# Patient Record
Sex: Female | Born: 1940 | Hispanic: No | Smoking: Never smoker
Health system: Southern US, Community
[De-identification: ages and names within clinical notes are randomized; demographics above are authoritative.]

## PROBLEM LIST (undated history)

## (undated) DIAGNOSIS — F0280 Dementia in other diseases classified elsewhere without behavioral disturbance: Secondary | ICD-10-CM

## (undated) DIAGNOSIS — R600 Localized edema: Secondary | ICD-10-CM

## (undated) DIAGNOSIS — M1990 Unspecified osteoarthritis, unspecified site: Secondary | ICD-10-CM

## (undated) DIAGNOSIS — H269 Unspecified cataract: Secondary | ICD-10-CM

## (undated) DIAGNOSIS — R609 Edema, unspecified: Secondary | ICD-10-CM

## (undated) DIAGNOSIS — E785 Hyperlipidemia, unspecified: Secondary | ICD-10-CM

## (undated) DIAGNOSIS — F32A Depression, unspecified: Secondary | ICD-10-CM

## (undated) DIAGNOSIS — E119 Type 2 diabetes mellitus without complications: Secondary | ICD-10-CM

## (undated) DIAGNOSIS — C44321 Squamous cell carcinoma of skin of nose: Secondary | ICD-10-CM

## (undated) DIAGNOSIS — M2550 Pain in unspecified joint: Secondary | ICD-10-CM

## (undated) DIAGNOSIS — F319 Bipolar disorder, unspecified: Secondary | ICD-10-CM

## (undated) DIAGNOSIS — I2510 Atherosclerotic heart disease of native coronary artery without angina pectoris: Secondary | ICD-10-CM

## (undated) DIAGNOSIS — F329 Major depressive disorder, single episode, unspecified: Secondary | ICD-10-CM

## (undated) DIAGNOSIS — Z8709 Personal history of other diseases of the respiratory system: Secondary | ICD-10-CM

## (undated) DIAGNOSIS — N189 Chronic kidney disease, unspecified: Secondary | ICD-10-CM

## (undated) DIAGNOSIS — I10 Essential (primary) hypertension: Secondary | ICD-10-CM

## (undated) HISTORY — PX: FRACTURE SURGERY: SHX138

## (undated) HISTORY — PX: OTHER SURGICAL HISTORY: SHX169

---

## 2003-04-16 HISTORY — PX: DILATION AND CURETTAGE OF UTERUS: SHX78

## 2005-04-15 HISTORY — PX: VAGINAL HYSTERECTOMY: SUR661

## 2007-05-12 HISTORY — PX: CORONARY ARTERY BYPASS GRAFT: SHX141

## 2008-02-20 ENCOUNTER — Inpatient Hospital Stay: Payer: Self-pay | Admitting: Internal Medicine

## 2008-02-21 ENCOUNTER — Ambulatory Visit: Payer: Self-pay | Admitting: Cardiology

## 2008-04-06 ENCOUNTER — Ambulatory Visit: Payer: Self-pay | Admitting: Internal Medicine

## 2008-04-07 ENCOUNTER — Emergency Department: Payer: Self-pay | Admitting: Emergency Medicine

## 2008-06-16 ENCOUNTER — Ambulatory Visit: Payer: Self-pay | Admitting: Internal Medicine

## 2010-04-15 DIAGNOSIS — C44321 Squamous cell carcinoma of skin of nose: Secondary | ICD-10-CM

## 2010-04-15 HISTORY — DX: Squamous cell carcinoma of skin of nose: C44.321

## 2010-04-15 HISTORY — PX: SQUAMOUS CELL CARCINOMA EXCISION: SHX2433

## 2012-09-10 ENCOUNTER — Ambulatory Visit: Payer: Self-pay | Admitting: Internal Medicine

## 2014-04-30 ENCOUNTER — Emergency Department (HOSPITAL_COMMUNITY): Payer: No Typology Code available for payment source | Admitting: Anesthesiology

## 2014-04-30 ENCOUNTER — Encounter (HOSPITAL_COMMUNITY): Admission: EM | Disposition: A | Discharge status: Skilled Nursing Facility | Payer: Self-pay | Source: Home / Self Care

## 2014-04-30 ENCOUNTER — Emergency Department (HOSPITAL_COMMUNITY): Payer: No Typology Code available for payment source

## 2014-04-30 ENCOUNTER — Inpatient Hospital Stay (HOSPITAL_COMMUNITY)
Admission: EM | Admit: 2014-04-30 | Discharge: 2014-05-05 | DRG: 493 | Disposition: A | Discharge status: Skilled Nursing Facility | Payer: No Typology Code available for payment source | Attending: General Surgery | Admitting: General Surgery

## 2014-04-30 ENCOUNTER — Encounter (HOSPITAL_COMMUNITY): Payer: Self-pay

## 2014-04-30 DIAGNOSIS — R001 Bradycardia, unspecified: Secondary | ICD-10-CM | POA: Diagnosis present

## 2014-04-30 DIAGNOSIS — Z951 Presence of aortocoronary bypass graft: Secondary | ICD-10-CM

## 2014-04-30 DIAGNOSIS — Z22322 Carrier or suspected carrier of Methicillin resistant Staphylococcus aureus: Secondary | ICD-10-CM

## 2014-04-30 DIAGNOSIS — N183 Chronic kidney disease, stage 3 unspecified: Secondary | ICD-10-CM | POA: Diagnosis present

## 2014-04-30 DIAGNOSIS — M179 Osteoarthritis of knee, unspecified: Secondary | ICD-10-CM | POA: Diagnosis present

## 2014-04-30 DIAGNOSIS — I129 Hypertensive chronic kidney disease with stage 1 through stage 4 chronic kidney disease, or unspecified chronic kidney disease: Secondary | ICD-10-CM | POA: Diagnosis present

## 2014-04-30 DIAGNOSIS — R079 Chest pain, unspecified: Secondary | ICD-10-CM | POA: Diagnosis present

## 2014-04-30 DIAGNOSIS — Z6841 Body Mass Index (BMI) 40.0 and over, adult: Secondary | ICD-10-CM | POA: Diagnosis not present

## 2014-04-30 DIAGNOSIS — E119 Type 2 diabetes mellitus without complications: Secondary | ICD-10-CM | POA: Diagnosis present

## 2014-04-30 DIAGNOSIS — I252 Old myocardial infarction: Secondary | ICD-10-CM | POA: Diagnosis not present

## 2014-04-30 DIAGNOSIS — E785 Hyperlipidemia, unspecified: Secondary | ICD-10-CM | POA: Diagnosis present

## 2014-04-30 DIAGNOSIS — K432 Incisional hernia without obstruction or gangrene: Secondary | ICD-10-CM | POA: Diagnosis present

## 2014-04-30 DIAGNOSIS — S82841C Displaced bimalleolar fracture of right lower leg, initial encounter for open fracture type IIIA, IIIB, or IIIC: Principal | ICD-10-CM | POA: Diagnosis present

## 2014-04-30 DIAGNOSIS — Z8249 Family history of ischemic heart disease and other diseases of the circulatory system: Secondary | ICD-10-CM | POA: Diagnosis not present

## 2014-04-30 DIAGNOSIS — R55 Syncope and collapse: Secondary | ICD-10-CM | POA: Diagnosis present

## 2014-04-30 DIAGNOSIS — N179 Acute kidney failure, unspecified: Secondary | ICD-10-CM | POA: Diagnosis not present

## 2014-04-30 DIAGNOSIS — I2510 Atherosclerotic heart disease of native coronary artery without angina pectoris: Secondary | ICD-10-CM | POA: Diagnosis present

## 2014-04-30 DIAGNOSIS — Z7982 Long term (current) use of aspirin: Secondary | ICD-10-CM

## 2014-04-30 DIAGNOSIS — R339 Retention of urine, unspecified: Secondary | ICD-10-CM | POA: Diagnosis not present

## 2014-04-30 DIAGNOSIS — S99911A Unspecified injury of right ankle, initial encounter: Secondary | ICD-10-CM | POA: Diagnosis present

## 2014-04-30 DIAGNOSIS — M25461 Effusion, right knee: Secondary | ICD-10-CM

## 2014-04-30 DIAGNOSIS — D62 Acute posthemorrhagic anemia: Secondary | ICD-10-CM | POA: Diagnosis not present

## 2014-04-30 DIAGNOSIS — S2241XA Multiple fractures of ribs, right side, initial encounter for closed fracture: Secondary | ICD-10-CM | POA: Diagnosis present

## 2014-04-30 DIAGNOSIS — S82891B Other fracture of right lower leg, initial encounter for open fracture type I or II: Secondary | ICD-10-CM | POA: Diagnosis present

## 2014-04-30 HISTORY — DX: Hyperlipidemia, unspecified: E78.5

## 2014-04-30 HISTORY — PX: I & D EXTREMITY: SHX5045

## 2014-04-30 HISTORY — DX: Chronic kidney disease, unspecified: N18.9

## 2014-04-30 HISTORY — DX: Bipolar disorder, unspecified: F31.9

## 2014-04-30 HISTORY — PX: ORIF ANKLE FRACTURE: SHX5408

## 2014-04-30 HISTORY — DX: Essential (primary) hypertension: I10

## 2014-04-30 HISTORY — DX: Depression, unspecified: F32.A

## 2014-04-30 HISTORY — DX: Atherosclerotic heart disease of native coronary artery without angina pectoris: I25.10

## 2014-04-30 HISTORY — DX: Major depressive disorder, single episode, unspecified: F32.9

## 2014-04-30 HISTORY — DX: Squamous cell carcinoma of skin of nose: C44.321

## 2014-04-30 HISTORY — DX: Unspecified osteoarthritis, unspecified site: M19.90

## 2014-04-30 HISTORY — DX: Type 2 diabetes mellitus without complications: E11.9

## 2014-04-30 LAB — COMPREHENSIVE METABOLIC PANEL
ALT: 18 U/L (ref 0–35)
ANION GAP: 9 (ref 5–15)
AST: 30 U/L (ref 0–37)
Albumin: 3.4 g/dL — ABNORMAL LOW (ref 3.5–5.2)
Alkaline Phosphatase: 72 U/L (ref 39–117)
BUN: 21 mg/dL (ref 6–23)
CALCIUM: 8.5 mg/dL (ref 8.4–10.5)
CO2: 22 mmol/L (ref 19–32)
Chloride: 106 mEq/L (ref 96–112)
Creatinine, Ser: 1.4 mg/dL — ABNORMAL HIGH (ref 0.50–1.10)
GFR calc Af Amer: 42 mL/min — ABNORMAL LOW (ref >90)
GFR, EST NON AFRICAN AMERICAN: 36 mL/min — AB (ref >90)
GLUCOSE: 144 mg/dL — AB (ref 70–99)
POTASSIUM: 4.4 mmol/L (ref 3.5–5.1)
SODIUM: 137 mmol/L (ref 135–145)
Total Bilirubin: 0.9 mg/dL (ref 0.3–1.2)
Total Protein: 5.7 g/dL — ABNORMAL LOW (ref 6.0–8.3)

## 2014-04-30 LAB — CBC
HEMATOCRIT: 34.4 % — AB (ref 36.0–46.0)
HEMOGLOBIN: 11.4 g/dL — AB (ref 12.0–15.0)
MCH: 28.4 pg (ref 26.0–34.0)
MCHC: 33.1 g/dL (ref 30.0–36.0)
MCV: 85.6 fL (ref 78.0–100.0)
PLATELETS: 183 10*3/uL (ref 150–400)
RBC: 4.02 MIL/uL (ref 3.87–5.11)
RDW: 13.9 % (ref 11.5–15.5)
WBC: 10.8 10*3/uL — AB (ref 4.0–10.5)

## 2014-04-30 LAB — PROTIME-INR
INR: 1.02 (ref 0.00–1.49)
Prothrombin Time: 13.5 seconds (ref 11.6–15.2)

## 2014-04-30 LAB — I-STAT TROPONIN, ED: Troponin i, poc: 0.01 ng/mL (ref 0.00–0.08)

## 2014-04-30 LAB — I-STAT CHEM 8, ED
BUN: 28 mg/dL — ABNORMAL HIGH (ref 6–23)
CALCIUM ION: 1.18 mmol/L (ref 1.13–1.30)
CHLORIDE: 106 meq/L (ref 96–112)
Creatinine, Ser: 1.6 mg/dL — ABNORMAL HIGH (ref 0.50–1.10)
Glucose, Bld: 146 mg/dL — ABNORMAL HIGH (ref 70–99)
HEMATOCRIT: 37 % (ref 36.0–46.0)
Hemoglobin: 12.6 g/dL (ref 12.0–15.0)
Potassium: 4.7 mmol/L (ref 3.5–5.1)
Sodium: 138 mmol/L (ref 135–145)
TCO2: 21 mmol/L (ref 0–100)

## 2014-04-30 LAB — ETHANOL: Alcohol, Ethyl (B): <5 mg/dL (ref 0–9)

## 2014-04-30 LAB — SAMPLE TO BLOOD BANK

## 2014-04-30 SURGERY — IRRIGATION AND DEBRIDEMENT EXTREMITY
Anesthesia: General | Laterality: Right

## 2014-04-30 MED ORDER — SODIUM CHLORIDE 0.9 % IR SOLN
Status: DC | PRN
  Administered 2014-04-30: 3000 mL

## 2014-04-30 MED ORDER — ROCURONIUM BROMIDE 100 MG/10ML IV SOLN
INTRAVENOUS | Status: DC | PRN
  Administered 2014-04-30: 50 mg via INTRAVENOUS

## 2014-04-30 MED ORDER — CEFAZOLIN SODIUM-DEXTROSE 2-3 GM-% IV SOLR
INTRAVENOUS | Status: AC
  Filled 2014-04-30: qty 50

## 2014-04-30 MED ORDER — ONDANSETRON HCL 4 MG/2ML IJ SOLN
INTRAMUSCULAR | Status: AC
  Filled 2014-04-30: qty 2

## 2014-04-30 MED ORDER — PROPOFOL 10 MG/ML IV BOLUS: 0.5000 mg/kg | Freq: Once | INTRAVENOUS | Status: DC

## 2014-04-30 MED ORDER — FENTANYL CITRATE 0.05 MG/ML IJ SOLN
50.0000 ug | Freq: Once | INTRAMUSCULAR | Status: AC
  Administered 2014-04-30: 50 ug via INTRAVENOUS

## 2014-04-30 MED ORDER — ROCURONIUM BROMIDE 50 MG/5ML IV SOLN
INTRAVENOUS | Status: AC
  Filled 2014-04-30: qty 1

## 2014-04-30 MED ORDER — SUCCINYLCHOLINE CHLORIDE 20 MG/ML IJ SOLN
INTRAMUSCULAR | Status: DC | PRN
  Administered 2014-04-30: 100 mg via INTRAVENOUS

## 2014-04-30 MED ORDER — SODIUM CHLORIDE 0.9 % IV SOLN
INTRAVENOUS | Status: AC | PRN
  Administered 2014-04-30: 1000 mL via INTRAVENOUS

## 2014-04-30 MED ORDER — FENTANYL CITRATE 0.05 MG/ML IJ SOLN
INTRAMUSCULAR | Status: AC
  Filled 2014-04-30: qty 5

## 2014-04-30 MED ORDER — SUCCINYLCHOLINE CHLORIDE 20 MG/ML IJ SOLN
INTRAMUSCULAR | Status: AC
  Filled 2014-04-30: qty 1

## 2014-04-30 MED ORDER — GLYCOPYRROLATE 0.2 MG/ML IJ SOLN
INTRAMUSCULAR | Status: AC
  Filled 2014-04-30: qty 3

## 2014-04-30 MED ORDER — PROPOFOL 10 MG/ML IV BOLUS
INTRAVENOUS | Status: AC | PRN
  Administered 2014-04-30 (×2): 45 ug via INTRAVENOUS

## 2014-04-30 MED ORDER — MIDAZOLAM HCL 2 MG/2ML IJ SOLN
INTRAMUSCULAR | Status: AC
  Filled 2014-04-30: qty 2

## 2014-04-30 MED ORDER — IOHEXOL 300 MG/ML  SOLN
80.0000 mL | Freq: Once | INTRAMUSCULAR | Status: AC | PRN
  Administered 2014-04-30: 80 mL via INTRAVENOUS

## 2014-04-30 MED ORDER — NEOSTIGMINE METHYLSULFATE 10 MG/10ML IV SOLN
INTRAVENOUS | Status: AC
  Filled 2014-04-30: qty 1

## 2014-04-30 MED ORDER — PROPOFOL 10 MG/ML IV BOLUS
INTRAVENOUS | Status: DC | PRN
  Administered 2014-04-30: 120 mg via INTRAVENOUS

## 2014-04-30 MED ORDER — PROPOFOL 10 MG/ML IV BOLUS
INTRAVENOUS | Status: AC
  Filled 2014-04-30: qty 20

## 2014-04-30 MED ORDER — FENTANYL CITRATE 0.05 MG/ML IJ SOLN
INTRAMUSCULAR | Status: DC | PRN
  Administered 2014-04-30: 200 ug via INTRAVENOUS
  Administered 2014-04-30 – 2014-05-01 (×4): 50 ug via INTRAVENOUS

## 2014-04-30 MED ORDER — LACTATED RINGERS IV SOLN
INTRAVENOUS | Status: DC | PRN
  Administered 2014-04-30 (×2): via INTRAVENOUS

## 2014-04-30 MED ORDER — CEFAZOLIN SODIUM 1-5 GM-% IV SOLN
1.0000 g | Freq: Three times a day (TID) | INTRAVENOUS | Status: DC
  Administered 2014-04-30: 1 g via INTRAVENOUS
  Administered 2014-04-30: 2 g via INTRAVENOUS
  Administered 2014-05-01 – 2014-05-02 (×4): 1 g via INTRAVENOUS
  Filled 2014-04-30 (×6): qty 50

## 2014-04-30 MED ORDER — FENTANYL CITRATE 0.05 MG/ML IJ SOLN
INTRAMUSCULAR | Status: AC | PRN
  Administered 2014-04-30: 50 ug via INTRAVENOUS

## 2014-04-30 MED ORDER — LIDOCAINE HCL (CARDIAC) 20 MG/ML IV SOLN
INTRAVENOUS | Status: AC
  Filled 2014-04-30: qty 5

## 2014-04-30 MED ORDER — ONDANSETRON HCL 4 MG/2ML IJ SOLN
INTRAMUSCULAR | Status: DC | PRN
  Administered 2014-04-30: 4 mg via INTRAVENOUS

## 2014-04-30 MED ORDER — FENTANYL CITRATE 0.05 MG/ML IJ SOLN
INTRAMUSCULAR | Status: AC
  Administered 2014-04-30: 50 ug via INTRAVENOUS
  Filled 2014-04-30: qty 2

## 2014-04-30 MED ORDER — MIDAZOLAM HCL 5 MG/5ML IJ SOLN
INTRAMUSCULAR | Status: DC | PRN
  Administered 2014-04-30: 1 mg via INTRAVENOUS

## 2014-04-30 MED ORDER — LIDOCAINE HCL (CARDIAC) 20 MG/ML IV SOLN
INTRAVENOUS | Status: DC | PRN
  Administered 2014-04-30: 40 mg via INTRAVENOUS

## 2014-04-30 SURGICAL SUPPLY — 84 items
2.7 DRILL BIT ×3 IMPLANT
BANDAGE ELASTIC 4 VELCRO ST LF (GAUZE/BANDAGES/DRESSINGS) ×3 IMPLANT
BANDAGE ESMARK 6X9 LF (GAUZE/BANDAGES/DRESSINGS) ×1 IMPLANT
BIT DRILL 2.5X2.75 QC CALB (BIT) ×3 IMPLANT
BIT DRILL 2.9 CANN QC NONSTRL (BIT) ×3 IMPLANT
BIT DRILL CALIBRATED 2.7 (BIT) ×2 IMPLANT
BIT DRILL CALIBRATED 2.7MM (BIT) ×1
BLADE SURG 10 STRL SS (BLADE) ×3 IMPLANT
BLADE SURG 15 STRL LF DISP TIS (BLADE) ×1 IMPLANT
BLADE SURG 15 STRL SS (BLADE) ×2
BNDG COHESIVE 4X5 TAN STRL (GAUZE/BANDAGES/DRESSINGS) ×3 IMPLANT
BNDG COHESIVE 6X5 TAN STRL LF (GAUZE/BANDAGES/DRESSINGS) ×3 IMPLANT
BNDG CONFORM 3 STRL LF (GAUZE/BANDAGES/DRESSINGS) ×3 IMPLANT
BNDG ESMARK 6X9 LF (GAUZE/BANDAGES/DRESSINGS) ×3
CANISTER SUCT 3000ML (MISCELLANEOUS) ×3 IMPLANT
CHLORAPREP W/TINT 26ML (MISCELLANEOUS) ×3 IMPLANT
COVER SURGICAL LIGHT HANDLE (MISCELLANEOUS) ×3 IMPLANT
CUFF TOURNIQUET SINGLE 34IN LL (TOURNIQUET CUFF) ×3 IMPLANT
CUFF TOURNIQUET SINGLE 44IN (TOURNIQUET CUFF) IMPLANT
DRAIN CHANNEL 10M FLAT 3/4 FLT (DRAIN) IMPLANT
DRAIN PENROSE 1/2X12 LTX STRL (WOUND CARE) IMPLANT
DRAPE OEC MINIVIEW 54X84 (DRAPES) ×3 IMPLANT
DRAPE U-SHAPE 47X51 STRL (DRAPES) ×3 IMPLANT
DRSG ADAPTIC 3X8 NADH LF (GAUZE/BANDAGES/DRESSINGS) ×3 IMPLANT
DRSG MEPITEL 4X7.2 (GAUZE/BANDAGES/DRESSINGS) ×3 IMPLANT
DRSG PAD ABDOMINAL 8X10 ST (GAUZE/BANDAGES/DRESSINGS) ×6 IMPLANT
ELECT REM PT RETURN 9FT ADLT (ELECTROSURGICAL) ×3
ELECTRODE REM PT RTRN 9FT ADLT (ELECTROSURGICAL) ×1 IMPLANT
EVACUATOR SILICONE 100CC (DRAIN) IMPLANT
GAUZE SPONGE 4X4 12PLY STRL (GAUZE/BANDAGES/DRESSINGS) IMPLANT
GLOVE BIO SURGEON STRL SZ7 (GLOVE) ×6 IMPLANT
GLOVE BIO SURGEON STRL SZ8 (GLOVE) ×6 IMPLANT
GLOVE BIOGEL PI IND STRL 7.0 (GLOVE) ×1 IMPLANT
GLOVE BIOGEL PI IND STRL 7.5 (GLOVE) ×1 IMPLANT
GLOVE BIOGEL PI IND STRL 8 (GLOVE) ×3 IMPLANT
GLOVE BIOGEL PI INDICATOR 7.0 (GLOVE) ×2
GLOVE BIOGEL PI INDICATOR 7.5 (GLOVE) ×2
GLOVE BIOGEL PI INDICATOR 8 (GLOVE) ×6
GOWN STRL REUS W/ TWL LRG LVL3 (GOWN DISPOSABLE) ×2 IMPLANT
GOWN STRL REUS W/ TWL XL LVL3 (GOWN DISPOSABLE) ×1 IMPLANT
GOWN STRL REUS W/TWL LRG LVL3 (GOWN DISPOSABLE) ×4
GOWN STRL REUS W/TWL XL LVL3 (GOWN DISPOSABLE) ×2
K-WIRE ACE 1.6X6 (WIRE) ×9
KIT BASIN OR (CUSTOM PROCEDURE TRAY) ×3 IMPLANT
KIT ROOM TURNOVER OR (KITS) ×3 IMPLANT
KWIRE ACE 1.6X6 (WIRE) ×3 IMPLANT
NEEDLE 22X1 1/2 (OR ONLY) (NEEDLE) IMPLANT
NS IRRIG 1000ML POUR BTL (IV SOLUTION) ×3 IMPLANT
PACK ORTHO EXTREMITY (CUSTOM PROCEDURE TRAY) ×3 IMPLANT
PAD ARMBOARD 7.5X6 YLW CONV (MISCELLANEOUS) ×6 IMPLANT
PAD CAST 4YDX4 CTTN HI CHSV (CAST SUPPLIES) ×1 IMPLANT
PADDING CAST ABS 4INX4YD NS (CAST SUPPLIES) ×2
PADDING CAST ABS COTTON 4X4 ST (CAST SUPPLIES) ×1 IMPLANT
PADDING CAST COTTON 4X4 STRL (CAST SUPPLIES) ×2
PADDING CAST COTTON 6X4 STRL (CAST SUPPLIES) ×3 IMPLANT
PLATE FIBULAR COMP LOCK 10H (Plate) ×3 IMPLANT
SCREW ACE CAN 4.0 40M (Screw) ×6 IMPLANT
SCREW LOCK 3.5X10 DIST TIB (Screw) ×6 IMPLANT
SCREW LOCK 3.5X12 DIST TIB (Screw) ×9 IMPLANT
SCREW NON LOCKING LP 3.5 16MM (Screw) ×6 IMPLANT
SET CYSTO W/LG BORE CLAMP LF (SET/KITS/TRAYS/PACK) ×3 IMPLANT
SOAP 2 % CHG 4 OZ (WOUND CARE) ×3 IMPLANT
SPLINT PLASTER CAST XFAST 5X30 (CAST SUPPLIES) ×1 IMPLANT
SPLINT PLASTER XFAST SET 5X30 (CAST SUPPLIES) ×2
SPONGE LAP 18X18 X RAY DECT (DISPOSABLE) ×3 IMPLANT
SPONGE LAP 4X18 X RAY DECT (DISPOSABLE) ×3 IMPLANT
STAPLER VISISTAT 35W (STAPLE) IMPLANT
SUCTION FRAZIER TIP 10 FR DISP (SUCTIONS) ×3 IMPLANT
SUT ETHILON 3 0 FSL (SUTURE) ×3 IMPLANT
SUT MNCRL AB 3-0 PS2 18 (SUTURE) IMPLANT
SUT PDS AB 2-0 CT1 27 (SUTURE) ×3 IMPLANT
SUT PROLENE 3 0 PS 2 (SUTURE) ×3 IMPLANT
SUT VIC AB 2-0 CT1 27 (SUTURE) ×4
SUT VIC AB 2-0 CT1 TAPERPNT 27 (SUTURE) ×2 IMPLANT
SUT VIC AB 3-0 PS2 18 (SUTURE) ×2
SUT VIC AB 3-0 PS2 18XBRD (SUTURE) ×1 IMPLANT
SYR CONTROL 10ML LL (SYRINGE) IMPLANT
TOWEL OR 17X24 6PK STRL BLUE (TOWEL DISPOSABLE) ×3 IMPLANT
TOWEL OR 17X26 10 PK STRL BLUE (TOWEL DISPOSABLE) ×3 IMPLANT
TUBE CONNECTING 12'X1/4 (SUCTIONS) ×1
TUBE CONNECTING 12X1/4 (SUCTIONS) ×2 IMPLANT
TUBING CYSTO DISP (UROLOGICAL SUPPLIES) ×3 IMPLANT
WATER STERILE IRR 1000ML POUR (IV SOLUTION) ×3 IMPLANT
YANKAUER SUCT BULB TIP NO VENT (SUCTIONS) ×3 IMPLANT

## 2014-04-30 NOTE — Anesthesia Preprocedure Evaluation (Addendum)
Anesthesia Evaluation  Patient identified by MRN, date of birth, ID band Patient awake    Reviewed: Allergy & Precautions, NPO status , Patient's Chart, lab work & pertinent test results, reviewed documented beta blocker date and time   History of Anesthesia Complications Negative for: history of anesthetic complications  Airway Mallampati: II  TM Distance: >3 FB Neck ROM: Full    Dental  (+) Teeth Intact, Dental Advisory Given, Chipped,    Pulmonary neg pulmonary ROS,          Cardiovascular hypertension, Pt. on medications and Pt. on home beta blockers + CAD and + CABG     Neuro/Psych negative neurological ROS  negative psych ROS   GI/Hepatic negative GI ROS, Neg liver ROS,   Endo/Other  diabetes, Type 2, Oral Hypoglycemic Agents  Renal/GU Renal InsufficiencyRenal disease     Musculoskeletal  (+) Arthritis -, Osteoarthritis,    Abdominal   Peds  Hematology   Anesthesia Other Findings   Reproductive/Obstetrics negative OB ROS                        Anesthesia Physical Anesthesia Plan  ASA: III and emergent  Anesthesia Plan: General   Post-op Pain Management:    Induction: Intravenous  Airway Management Planned: Oral ETT  Additional Equipment:   Intra-op Plan:   Post-operative Plan: Possible Post-op intubation/ventilation  Informed Consent: I have reviewed the patients History and Physical, chart, labs and discussed the procedure including the risks, benefits and alternatives for the proposed anesthesia with the patient or authorized representative who has indicated his/her understanding and acceptance.   Dental advisory given  Plan Discussed with: CRNA and Anesthesiologist  Anesthesia Plan Comments:       Anesthesia Quick Evaluation
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
## 2014-04-30 NOTE — Anesthesia Procedure Notes (Signed)
Procedure Name: Intubation Date/Time: 04/30/2014 10:44 PM Performed by: Anibal Quinby S Pre-anesthesia Checklist: Patient identified, Timeout performed, Emergency Drugs available, Suction available and Patient being monitored Patient Re-evaluated:Patient Re-evaluated prior to inductionOxygen Delivery Method: Circle system utilized Preoxygenation: Pre-oxygenation with 100% oxygen Intubation Type: IV induction, Rapid sequence and Cricoid Pressure applied Ventilation: Mask ventilation without difficulty Laryngoscope Size: Mac and 3 Grade View: Grade I Tube type: Oral Tube size: 7.5 mm Number of attempts: 1 Airway Equipment and Method: Stylet Placement Confirmation: ETT inserted through vocal cords under direct vision,  positive ETCO2 and breath sounds checked- equal and bilateral Secured at: 22 cm Tube secured with: Tape Dental Injury: Teeth and Oropharynx as per pre-operative assessment
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
## 2014-05-01 DIAGNOSIS — E118 Type 2 diabetes mellitus with unspecified complications: Secondary | ICD-10-CM

## 2014-05-01 DIAGNOSIS — S82892A Other fracture of left lower leg, initial encounter for closed fracture: Secondary | ICD-10-CM

## 2014-05-01 DIAGNOSIS — E785 Hyperlipidemia, unspecified: Secondary | ICD-10-CM | POA: Insufficient documentation

## 2014-05-01 DIAGNOSIS — S2241XA Multiple fractures of ribs, right side, initial encounter for closed fracture: Secondary | ICD-10-CM | POA: Diagnosis present

## 2014-05-01 DIAGNOSIS — E119 Type 2 diabetes mellitus without complications: Secondary | ICD-10-CM | POA: Insufficient documentation

## 2014-05-01 DIAGNOSIS — S2241XD Multiple fractures of ribs, right side, subsequent encounter for fracture with routine healing: Secondary | ICD-10-CM

## 2014-05-01 DIAGNOSIS — R7989 Other specified abnormal findings of blood chemistry: Secondary | ICD-10-CM

## 2014-05-01 DIAGNOSIS — R079 Chest pain, unspecified: Secondary | ICD-10-CM | POA: Diagnosis present

## 2014-05-01 DIAGNOSIS — I10 Essential (primary) hypertension: Secondary | ICD-10-CM | POA: Insufficient documentation

## 2014-05-01 DIAGNOSIS — R55 Syncope and collapse: Secondary | ICD-10-CM | POA: Diagnosis present

## 2014-05-01 DIAGNOSIS — S82891B Other fracture of right lower leg, initial encounter for open fracture type I or II: Secondary | ICD-10-CM | POA: Diagnosis present

## 2014-05-01 LAB — COMPREHENSIVE METABOLIC PANEL
ALT: 17 U/L (ref 0–35)
AST: 34 U/L (ref 0–37)
Albumin: 3.4 g/dL — ABNORMAL LOW (ref 3.5–5.2)
Alkaline Phosphatase: 72 U/L (ref 39–117)
Anion gap: 9 (ref 5–15)
BILIRUBIN TOTAL: 0.9 mg/dL (ref 0.3–1.2)
BUN: 22 mg/dL (ref 6–23)
CALCIUM: 9 mg/dL (ref 8.4–10.5)
CO2: 24 mmol/L (ref 19–32)
CREATININE: 1.67 mg/dL — AB (ref 0.50–1.10)
Chloride: 105 mEq/L (ref 96–112)
GFR calc Af Amer: 34 mL/min — ABNORMAL LOW (ref >90)
GFR, EST NON AFRICAN AMERICAN: 29 mL/min — AB (ref >90)
Glucose, Bld: 131 mg/dL — ABNORMAL HIGH (ref 70–99)
Potassium: 4 mmol/L (ref 3.5–5.1)
SODIUM: 138 mmol/L (ref 135–145)
Total Protein: 6.2 g/dL (ref 6.0–8.3)

## 2014-05-01 LAB — GLUCOSE, CAPILLARY
GLUCOSE-CAPILLARY: 137 mg/dL — AB (ref 70–99)
GLUCOSE-CAPILLARY: 153 mg/dL — AB (ref 70–99)
GLUCOSE-CAPILLARY: 161 mg/dL — AB (ref 70–99)
Glucose-Capillary: 165 mg/dL — ABNORMAL HIGH (ref 70–99)
Glucose-Capillary: 166 mg/dL — ABNORMAL HIGH (ref 70–99)
Glucose-Capillary: 181 mg/dL — ABNORMAL HIGH (ref 70–99)
Glucose-Capillary: 99 mg/dL (ref 70–99)

## 2014-05-01 LAB — CBC
HCT: 33 % — ABNORMAL LOW (ref 36.0–46.0)
HEMATOCRIT: 35 % — AB (ref 36.0–46.0)
HEMOGLOBIN: 11.3 g/dL — AB (ref 12.0–15.0)
Hemoglobin: 10.8 g/dL — ABNORMAL LOW (ref 12.0–15.0)
MCH: 29 pg (ref 26.0–34.0)
MCH: 27.8 pg (ref 26.0–34.0)
MCHC: 32.3 g/dL (ref 30.0–36.0)
MCHC: 32.7 g/dL (ref 30.0–36.0)
MCV: 89.7 fL (ref 78.0–100.0)
MCV: 85.1 fL (ref 78.0–100.0)
PLATELETS: 158 10*3/uL (ref 150–400)
Platelets: 180 10*3/uL (ref 150–400)
RBC: 3.9 MIL/uL (ref 3.87–5.11)
RBC: 3.88 MIL/uL (ref 3.87–5.11)
RDW: 13.8 % (ref 11.5–15.5)
RDW: 13.5 % (ref 11.5–15.5)
WBC: 10.9 10*3/uL — ABNORMAL HIGH (ref 4.0–10.5)
WBC: 10.7 10*3/uL — ABNORMAL HIGH (ref 4.0–10.5)

## 2014-05-01 LAB — URINALYSIS, ROUTINE W REFLEX MICROSCOPIC
Bilirubin Urine: NEGATIVE
Glucose, UA: NEGATIVE mg/dL
Hgb urine dipstick: NEGATIVE
Ketones, ur: NEGATIVE mg/dL
LEUKOCYTES UA: NEGATIVE
NITRITE: NEGATIVE
PROTEIN: NEGATIVE mg/dL
Specific Gravity, Urine: 1.036 — ABNORMAL HIGH (ref 1.005–1.030)
Urobilinogen, UA: 0.2 mg/dL (ref 0.0–1.0)
pH: 5 (ref 5.0–8.0)

## 2014-05-01 LAB — TROPONIN I
Troponin I: 0.04 ng/mL — ABNORMAL HIGH (ref <0.031)
Troponin I: <0.03 ng/mL (ref <0.031)

## 2014-05-01 LAB — HEMOGLOBIN A1C
Hgb A1c MFr Bld: 6 % — ABNORMAL HIGH (ref <5.7)
MEAN PLASMA GLUCOSE: 126 mg/dL — AB (ref <117)

## 2014-05-01 LAB — MRSA PCR SCREENING: MRSA by PCR: POSITIVE — AB

## 2014-05-01 MED ORDER — HYDRALAZINE HCL 50 MG PO TABS
50.0000 mg | ORAL_TABLET | Freq: Two times a day (BID) | ORAL | Status: DC
  Administered 2014-05-01 – 2014-05-05 (×7): 50 mg via ORAL
  Filled 2014-05-01 (×11): qty 1

## 2014-05-01 MED ORDER — ONDANSETRON HCL 4 MG/2ML IJ SOLN
4.0000 mg | Freq: Four times a day (QID) | INTRAMUSCULAR | Status: DC | PRN
  Administered 2014-05-01: 4 mg via INTRAVENOUS
  Filled 2014-05-01: qty 2

## 2014-05-01 MED ORDER — DOCUSATE SODIUM 100 MG PO CAPS
100.0000 mg | ORAL_CAPSULE | Freq: Two times a day (BID) | ORAL | Status: DC
  Administered 2014-05-01 – 2014-05-05 (×8): 100 mg via ORAL
  Filled 2014-05-01 (×10): qty 1

## 2014-05-01 MED ORDER — POTASSIUM CHLORIDE IN NACL 20-0.9 MEQ/L-% IV SOLN
INTRAVENOUS | Status: DC
  Administered 2014-05-01: 03:00:00 via INTRAVENOUS
  Filled 2014-05-01 (×3): qty 1000

## 2014-05-01 MED ORDER — PANTOPRAZOLE SODIUM 40 MG PO TBEC
40.0000 mg | DELAYED_RELEASE_TABLET | Freq: Every day | ORAL | Status: DC
  Administered 2014-05-02: 40 mg via ORAL
  Filled 2014-05-01: qty 1

## 2014-05-01 MED ORDER — ENOXAPARIN SODIUM 40 MG/0.4ML ~~LOC~~ SOLN
40.0000 mg | SUBCUTANEOUS | Status: DC
  Administered 2014-05-02: 40 mg via SUBCUTANEOUS
  Filled 2014-05-01 (×2): qty 0.4

## 2014-05-01 MED ORDER — FENTANYL CITRATE 0.05 MG/ML IJ SOLN
INTRAMUSCULAR | Status: AC
  Administered 2014-05-01: 50 ug via INTRAVENOUS
  Filled 2014-05-01: qty 2

## 2014-05-01 MED ORDER — NEOSTIGMINE METHYLSULFATE 10 MG/10ML IV SOLN
INTRAVENOUS | Status: DC | PRN
  Administered 2014-05-01: 4 mg via INTRAVENOUS

## 2014-05-01 MED ORDER — LINAGLIPTIN 5 MG PO TABS
5.0000 mg | ORAL_TABLET | Freq: Every day | ORAL | Status: DC
  Administered 2014-05-01 – 2014-05-05 (×4): 5 mg via ORAL
  Filled 2014-05-01 (×5): qty 1

## 2014-05-01 MED ORDER — SERTRALINE HCL 50 MG PO TABS
50.0000 mg | ORAL_TABLET | Freq: Every day | ORAL | Status: DC
  Administered 2014-05-01 – 2014-05-05 (×4): 50 mg via ORAL
  Filled 2014-05-01 (×5): qty 1

## 2014-05-01 MED ORDER — OXYCODONE HCL 5 MG PO TABS
5.0000 mg | ORAL_TABLET | ORAL | Status: DC | PRN
  Administered 2014-05-01 (×3): 10 mg via ORAL
  Administered 2014-05-01: 5 mg via ORAL
  Administered 2014-05-02 (×3): 10 mg via ORAL
  Filled 2014-05-01 (×3): qty 2
  Filled 2014-05-01: qty 1
  Filled 2014-05-01 (×4): qty 2

## 2014-05-01 MED ORDER — ONDANSETRON HCL 4 MG/2ML IJ SOLN: 4.0000 mg | Freq: Four times a day (QID) | INTRAMUSCULAR | Status: DC | PRN

## 2014-05-01 MED ORDER — SODIUM CHLORIDE 0.9 % IV SOLN: INTRAVENOUS | Status: DC

## 2014-05-01 MED ORDER — ONDANSETRON HCL 4 MG PO TABS
4.0000 mg | ORAL_TABLET | Freq: Four times a day (QID) | ORAL | Status: DC | PRN
  Filled 2014-05-01: qty 1

## 2014-05-01 MED ORDER — METOCLOPRAMIDE HCL 5 MG/ML IJ SOLN
5.0000 mg | Freq: Three times a day (TID) | INTRAMUSCULAR | Status: DC | PRN
  Filled 2014-05-01: qty 2

## 2014-05-01 MED ORDER — CHLORHEXIDINE GLUCONATE CLOTH 2 % EX PADS: 6.0000 | MEDICATED_PAD | Freq: Every day | CUTANEOUS | Status: DC

## 2014-05-01 MED ORDER — ONDANSETRON HCL 4 MG/2ML IJ SOLN
INTRAMUSCULAR | Status: AC
  Filled 2014-05-01: qty 2

## 2014-05-01 MED ORDER — PANTOPRAZOLE SODIUM 40 MG IV SOLR
40.0000 mg | Freq: Every day | INTRAVENOUS | Status: DC
Start: 2014-05-01 — End: 2014-05-03
  Administered 2014-05-01: 40 mg via INTRAVENOUS
  Filled 2014-05-01 (×3): qty 40

## 2014-05-01 MED ORDER — ONDANSETRON HCL 4 MG PO TABS: 4.0000 mg | ORAL_TABLET | Freq: Four times a day (QID) | ORAL | Status: DC | PRN

## 2014-05-01 MED ORDER — CHLORHEXIDINE GLUCONATE CLOTH 2 % EX PADS
6.0000 | MEDICATED_PAD | Freq: Every day | CUTANEOUS | Status: AC
  Administered 2014-05-01 – 2014-05-05 (×5): 6 via TOPICAL

## 2014-05-01 MED ORDER — FENTANYL CITRATE 0.05 MG/ML IJ SOLN
INTRAMUSCULAR | Status: AC
  Administered 2014-05-01: 25 ug via INTRAVENOUS
  Filled 2014-05-01: qty 2

## 2014-05-01 MED ORDER — METOCLOPRAMIDE HCL 5 MG PO TABS
5.0000 mg | ORAL_TABLET | Freq: Three times a day (TID) | ORAL | Status: DC | PRN
  Filled 2014-05-01: qty 2

## 2014-05-01 MED ORDER — ASPIRIN EC 325 MG PO TBEC
325.0000 mg | DELAYED_RELEASE_TABLET | Freq: Every day | ORAL | Status: DC
  Administered 2014-05-01 – 2014-05-05 (×4): 325 mg via ORAL
  Filled 2014-05-01 (×5): qty 1

## 2014-05-01 MED ORDER — INSULIN ASPART 100 UNIT/ML ~~LOC~~ SOLN
0.0000 [IU] | SUBCUTANEOUS | Status: DC
  Administered 2014-05-01: 3 [IU] via SUBCUTANEOUS
  Administered 2014-05-01 – 2014-05-02 (×3): 4 [IU] via SUBCUTANEOUS
  Administered 2014-05-02: 3 [IU] via SUBCUTANEOUS
  Administered 2014-05-02 (×2): 4 [IU] via SUBCUTANEOUS
  Administered 2014-05-03 (×2): 3 [IU] via SUBCUTANEOUS
  Administered 2014-05-03 (×2): 4 [IU] via SUBCUTANEOUS
  Administered 2014-05-03: 3 [IU] via SUBCUTANEOUS
  Administered 2014-05-04: 4 [IU] via SUBCUTANEOUS
  Administered 2014-05-04 – 2014-05-05 (×4): 3 [IU] via SUBCUTANEOUS

## 2014-05-01 MED ORDER — FENTANYL CITRATE 0.05 MG/ML IJ SOLN
25.0000 ug | INTRAMUSCULAR | Status: DC | PRN
  Administered 2014-05-01: 50 ug via INTRAVENOUS
  Administered 2014-05-01 (×4): 25 ug via INTRAVENOUS

## 2014-05-01 MED ORDER — GLYCOPYRROLATE 0.2 MG/ML IJ SOLN
INTRAMUSCULAR | Status: DC | PRN
  Administered 2014-05-01: 0.6 mg via INTRAVENOUS

## 2014-05-01 MED ORDER — METOPROLOL SUCCINATE ER 100 MG PO TB24
100.0000 mg | ORAL_TABLET | Freq: Every day | ORAL | Status: DC
  Filled 2014-05-01: qty 1

## 2014-05-01 MED ORDER — MUPIROCIN 2 % EX OINT: 1.0000 "application " | TOPICAL_OINTMENT | Freq: Two times a day (BID) | CUTANEOUS | Status: DC

## 2014-05-01 MED ORDER — ENOXAPARIN SODIUM 40 MG/0.4ML ~~LOC~~ SOLN: 40.0000 mg | SUBCUTANEOUS | Status: DC

## 2014-05-01 MED ORDER — MUPIROCIN 2 % EX OINT
1.0000 "application " | TOPICAL_OINTMENT | Freq: Two times a day (BID) | CUTANEOUS | Status: DC
  Administered 2014-05-01 – 2014-05-05 (×8): 1 via NASAL
  Filled 2014-05-01 (×2): qty 22

## 2014-05-01 MED ORDER — SENNA 8.6 MG PO TABS
2.0000 | ORAL_TABLET | Freq: Two times a day (BID) | ORAL | Status: DC
  Administered 2014-05-01 – 2014-05-05 (×8): 17.2 mg via ORAL
  Filled 2014-05-01 (×11): qty 2

## 2014-05-01 MED ORDER — MORPHINE SULFATE 2 MG/ML IJ SOLN
1.0000 mg | INTRAMUSCULAR | Status: DC | PRN
  Administered 2014-05-01 – 2014-05-03 (×5): 1 mg via INTRAVENOUS
  Filled 2014-05-01 (×5): qty 1

## 2014-05-01 MED ORDER — ATORVASTATIN CALCIUM 40 MG PO TABS
40.0000 mg | ORAL_TABLET | Freq: Every day | ORAL | Status: DC
  Administered 2014-05-01 – 2014-05-04 (×4): 40 mg via ORAL
  Filled 2014-05-01 (×6): qty 1

## 2014-05-01 NOTE — Transfer of Care (Signed)
Immediate Anesthesia Transfer of Care Note  Patient: [name]  Procedure(s) Performed: Procedure(s): IRRIGATION AND DEBRIDEMENT EXTREMITY (Right) OPEN REDUCTION INTERNAL FIXATION (ORIF) ANKLE FRACTURE (Right)  Patient Location: PACU  Anesthesia Type:General  Level of Consciousness: awake  Airway & Oxygen Therapy: Patient Spontanous Breathing and Patient connected to face mask oxygen  Post-op Assessment: Report given to PACU RN and Post -op Vital signs reviewed and stable  Post vital signs: Reviewed and stable  Complications: No apparent anesthesia complications
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
## 2014-05-01 NOTE — Progress Notes (Signed)
Patient ID: ISABELLY CATLEDGE, female   DOB: September 30, 1940, 74 y.o.   MRN: [id number] Subjective: 1 Day Post-Op Procedure(s) (LRB): IRRIGATION AND DEBRIDEMENT EXTREMITY (Right) OPEN REDUCTION INTERNAL FIXATION (ORIF) ANKLE FRACTURE (Right)    Patient reports pain as moderate.  Objective:   VITALS:   Filed Vitals:   05/01/14 0752  BP: 149/60  Pulse: 68  Temp: 98.9 F (37.2 C)  Resp: 21    Neurovascular intact Sensation intact distally Incision: dressing C/D/I   Moving toes Right leg elevated  LABS  Recent Labs  04/30/14 1814 04/30/14 1820 05/01/14 0326  HGB 12.6 11.4* 11.3*  HCT 37.0 34.4* 35.0*  WBC  --  10.8* 10.9*  PLT  --  183 158     Recent Labs  04/30/14 1814 04/30/14 1820  NA 138 137  K 4.7 4.4  BUN 28* 21  CREATININE 1.60* 1.40*  GLUCOSE 146* 144*     Recent Labs  04/30/14 1820  INR 1.02     Assessment/Plan: 1 Day Post-Op Procedure(s) (LRB): IRRIGATION AND DEBRIDEMENT EXTREMITY (Right) OPEN REDUCTION INTERNAL FIXATION (ORIF) ANKLE FRACTURE (Right)   Advance diet Up with therapy   Keep leg elevated to help with post-injury swelling Ice to leg will help as well NWB right lower extremity
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
## 2014-05-01 NOTE — Progress Notes (Signed)
Pt complaining of needing to urinate but not being able to. Bladder scanned patient and found to have greater than 337cc. [name], NP notified. Orders given for in and out cath. Will continue to monitor.
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
## 2014-05-01 NOTE — Progress Notes (Deleted)
  Echocardiogram 2D Echocardiogram has been performed.  [name] M 05/01/2014, 2:40 PM

## 2014-05-01 NOTE — Anesthesia Postprocedure Evaluation (Signed)
  Anesthesia Post-op Note  Patient: [name]  Procedure(s) Performed: Procedure(s): IRRIGATION AND DEBRIDEMENT EXTREMITY (Right) OPEN REDUCTION INTERNAL FIXATION (ORIF) ANKLE FRACTURE (Right)  Patient Location: PACU  Anesthesia Type:General  Level of Consciousness: awake  Airway and Oxygen Therapy: Patient Spontanous Breathing  Post-op Pain: mild  Post-op Assessment: Post-op Vital signs reviewed  Post-op Vital Signs: Reviewed  Last Vitals:  Filed Vitals:   05/01/14 0100  BP: 126/48  Pulse: 71  Temp:   Resp: 21    Complications: No apparent anesthesia complications
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
## 2014-05-01 NOTE — Brief Op Note (Signed)
04/30/2014  12:38 AM  PATIENT:  Dewayne Hatch  74 y.o. female  PRE-OPERATIVE DIAGNOSIS:  Open right ankle fracture  POST-OPERATIVE DIAGNOSIS: 1.  Open right ankle bimalleolar fracture      2.  10 cm laceration of right medial ankle      3.  Avulsion of deltoid ligament from right medial malleolus  Procedure(s): 1.  Irrigation and excisional debridement of open right ankle fracture including skin, subcutaneous tissue, muscle and bone 2.  ORIF right ankle bimalleolar fracture 3.  Repair of deltoid ligament 4.  Intermediate closure of right medial ankle laceration 10 cm long 5.  AP, lateral and mortise xrays of right ankle 6.  Stress exam of right ankle under fluoro  SURGEON:  Wylene Simmer, MD  ASSISTANT: n/a  ANESTHESIA:   General  EBL:  minimal   TOURNIQUET:  1:03 at [id number] mm Hg  COMPLICATIONS:  None apparent  DISPOSITION:  Extubated, awake and stable to recovery.  DICTATION ID:  [id number]

## 2014-05-02 ENCOUNTER — Inpatient Hospital Stay (HOSPITAL_COMMUNITY): Payer: No Typology Code available for payment source

## 2014-05-02 DIAGNOSIS — S82891S Other fracture of right lower leg, sequela: Secondary | ICD-10-CM

## 2014-05-02 DIAGNOSIS — S2241XS Multiple fractures of ribs, right side, sequela: Secondary | ICD-10-CM

## 2014-05-02 DIAGNOSIS — N179 Acute kidney failure, unspecified: Secondary | ICD-10-CM

## 2014-05-02 LAB — GLUCOSE, CAPILLARY
GLUCOSE-CAPILLARY: 147 mg/dL — AB (ref 70–99)
GLUCOSE-CAPILLARY: 192 mg/dL — AB (ref 70–99)
GLUCOSE-CAPILLARY: 200 mg/dL — AB (ref 70–99)
Glucose-Capillary: 144 mg/dL — ABNORMAL HIGH (ref 70–99)
Glucose-Capillary: 117 mg/dL — ABNORMAL HIGH (ref 70–99)
Glucose-Capillary: 132 mg/dL — ABNORMAL HIGH (ref 70–99)
Glucose-Capillary: 158 mg/dL — ABNORMAL HIGH (ref 70–99)

## 2014-05-02 NOTE — Progress Notes (Addendum)
Subjective: 2 Days Post-Op Procedure(s) (LRB): IRRIGATION AND DEBRIDEMENT EXTREMITY (Right) OPEN REDUCTION INTERNAL FIXATION (ORIF) ANKLE FRACTURE (Right) Patient reports pain as mild.  Pt denies any pain at the ankle.  She c/o pain at the right knee.  She is treated for knee arthritis by an orthopaedist in [place].  No h/o surgery on the right knee.  Scheduled for cardiac echo today as part of syncope workup.  Still on ancef for hx of open fx.  Objective: Vital signs in last 24 hours: Temp:  [98.7 F (37.1 C)-99.8 F (37.7 C)] 98.9 F (37.2 C) (01/18 0412) Pulse Rate:  [68-92] 85 (01/18 0412) Resp:  [16-21] 16 (01/18 0412) BP: (106-149)/(39-60) 121/47 mmHg (01/18 0412) SpO2:  [93 %-100 %] 93 % (01/18 0412) Weight:  [96.7 kg (213 lb 3 oz)] 96.7 kg (213 lb 3 oz) (01/18 0412)  Intake/Output from previous day: 01/17 0701 - 01/18 0700 In: 1305 [P.O.:1080; I.V.:75; IV Piggyback:150] Out: 800 [Urine:800] Intake/Output this shift:     Recent Labs  04/30/14 1814 04/30/14 1820 05/01/14 0326 05/01/14 0855  HGB 12.6 11.4* 11.3* 10.8*    Recent Labs  05/01/14 0326 05/01/14 0855  WBC 10.9* 10.7*  RBC 3.90 3.88  HCT 35.0* 33.0*  PLT 158 180    Recent Labs  04/30/14 1820 05/01/14 0855  NA 137 138  K 4.4 4.0  CL 106 105  CO2 22 24  BUN 21 22  CREATININE 1.40* 1.67*  GLUCOSE 144* 131*  CALCIUM 8.5 9.0    Recent Labs  04/30/14 1820  INR 1.02    PE: R LE splinted.  NVI at the toes.  Splint intact.  R knee with effusion and swelling anteriorly.  Small abrasion at the patella.  Slight ecchymosis.  5/5 strength at the quad.  extensor mech intact.  Assessment/Plan: 2 Days Post-Op Procedure(s) (LRB): IRRIGATION AND DEBRIDEMENT EXTREMITY (Right) OPEN REDUCTION INTERNAL FIXATION (ORIF) ANKLE FRACTURE (Right)  NWB on R LE.  PT, OT for mobilization.  D/c ancef.  Xrays of right knee ordered to eval for fracture.    D/w with Dr. Hulen Skains.  Wylene Simmer 05/02/2014, 7:48  AM

## 2014-05-02 NOTE — Progress Notes (Signed)
Patient out of the room for X rays. Will follow up later today.

## 2014-05-02 NOTE — Progress Notes (Signed)
Bedside EEG completed, results pending. 
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
## 2014-05-02 NOTE — Progress Notes (Signed)
  Echocardiogram 2D Echocardiogram has been performed.  Darlina Sicilian M 05/02/2014, 4:20 PM
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
## 2014-05-02 NOTE — Progress Notes (Signed)
Pt had not voided since in-and-out cath at 0700 on 1/17. Bladder scan was performed and found 534 cc of urine in the bladder. Pt states that she has had urinary retention and had urinary catheters during previous admissions. [facility] notified and in-and-out cath was ordered. In-and-out cath was performed and emptied 600 cc of clear, yellow urine from pt's bladder.    Hart [place], RN, BSN
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
## 2014-05-02 NOTE — Progress Notes (Signed)
OT Cancellation Note  Patient Details Name: [name] MRN: [id number] DOB: 03/05/1941   Cancelled Treatment:    Reason Eval/Treat Not Completed: Attempted OT evaluation. Transport in room getting patient ready to transport to a procedure or test. Patient unavailable at this time. Will reattempt OT eval at a later time.  Arlenne Kimbley A 05/02/2014, 8:33 AM
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
## 2014-05-03 DIAGNOSIS — N183 Chronic kidney disease, stage 3 unspecified: Secondary | ICD-10-CM | POA: Diagnosis present

## 2014-05-03 DIAGNOSIS — D62 Acute posthemorrhagic anemia: Secondary | ICD-10-CM | POA: Diagnosis not present

## 2014-05-03 LAB — BASIC METABOLIC PANEL
Anion gap: 10 (ref 5–15)
BUN: 24 mg/dL — ABNORMAL HIGH (ref 6–23)
CALCIUM: 9.3 mg/dL (ref 8.4–10.5)
CO2: 24 mmol/L (ref 19–32)
CREATININE: 1.68 mg/dL — AB (ref 0.50–1.10)
Chloride: 104 mEq/L (ref 96–112)
GFR calc Af Amer: 33 mL/min — ABNORMAL LOW (ref >90)
GFR, EST NON AFRICAN AMERICAN: 29 mL/min — AB (ref >90)
GLUCOSE: 120 mg/dL — AB (ref 70–99)
POTASSIUM: 3.4 mmol/L — AB (ref 3.5–5.1)
Sodium: 138 mmol/L (ref 135–145)

## 2014-05-03 LAB — GLUCOSE, CAPILLARY
GLUCOSE-CAPILLARY: 104 mg/dL — AB (ref 70–99)
GLUCOSE-CAPILLARY: 161 mg/dL — AB (ref 70–99)
Glucose-Capillary: 128 mg/dL — ABNORMAL HIGH (ref 70–99)
Glucose-Capillary: 122 mg/dL — ABNORMAL HIGH (ref 70–99)
Glucose-Capillary: 139 mg/dL — ABNORMAL HIGH (ref 70–99)
Glucose-Capillary: 173 mg/dL — ABNORMAL HIGH (ref 70–99)

## 2014-05-03 MED ORDER — MORPHINE SULFATE 2 MG/ML IJ SOLN
2.0000 mg | INTRAMUSCULAR | Status: DC | PRN
  Administered 2014-05-03 (×2): 2 mg via INTRAVENOUS
  Filled 2014-05-03 (×2): qty 1

## 2014-05-03 MED ORDER — METOPROLOL SUCCINATE ER 100 MG PO TB24
100.0000 mg | ORAL_TABLET | Freq: Every day | ORAL | Status: DC
  Administered 2014-05-04 – 2014-05-05 (×2): 100 mg via ORAL
  Filled 2014-05-03 (×3): qty 1

## 2014-05-03 MED ORDER — POLYETHYLENE GLYCOL 3350 17 G PO PACK
17.0000 g | PACK | Freq: Every day | ORAL | Status: DC
  Administered 2014-05-04 – 2014-05-05 (×2): 17 g via ORAL
  Filled 2014-05-03 (×3): qty 1

## 2014-05-03 MED ORDER — OXYCODONE HCL 5 MG PO TABS
5.0000 mg | ORAL_TABLET | ORAL | Status: DC | PRN
  Administered 2014-05-03 – 2014-05-05 (×2): 10 mg via ORAL
  Filled 2014-05-03 (×2): qty 2

## 2014-05-03 MED ORDER — TRAMADOL HCL 50 MG PO TABS
50.0000 mg | ORAL_TABLET | Freq: Four times a day (QID) | ORAL | Status: DC
  Administered 2014-05-03 – 2014-05-05 (×10): 50 mg via ORAL
  Filled 2014-05-03 (×10): qty 1

## 2014-05-03 MED ORDER — POTASSIUM CHLORIDE CRYS ER 20 MEQ PO TBCR
40.0000 meq | EXTENDED_RELEASE_TABLET | Freq: Once | ORAL | Status: AC
  Administered 2014-05-03: 40 meq via ORAL
  Filled 2014-05-03: qty 2

## 2014-05-03 MED ORDER — ENOXAPARIN SODIUM 30 MG/0.3ML ~~LOC~~ SOLN
30.0000 mg | Freq: Two times a day (BID) | SUBCUTANEOUS | Status: DC
  Administered 2014-05-03 – 2014-05-05 (×4): 30 mg via SUBCUTANEOUS
  Filled 2014-05-03 (×6): qty 0.3

## 2014-05-03 MED ORDER — TRAMADOL HCL 50 MG PO TABS: 100.0000 mg | ORAL_TABLET | Freq: Four times a day (QID) | ORAL | Status: DC
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
## 2014-05-03 NOTE — Progress Notes (Signed)
Subjective: 3 Days Post-Op Procedure(s) (LRB): IRRIGATION AND DEBRIDEMENT EXTREMITY (Right) OPEN REDUCTION INTERNAL FIXATION (ORIF) ANKLE FRACTURE (Right) Patient reports pain as mild.  She reports that her ankle isn't really hurting and that her knee is feeling better.  No n/v/f/c.  Objective: Vital signs in last 24 hours: Temp:  [98.9 F (37.2 C)-99.7 F (37.6 C)] 99.1 F (37.3 C) (01/19 1047) Pulse Rate:  [81-102] 92 (01/19 1047) Resp:  [13-22] 16 (01/19 1047) BP: (111-134)/(44-56) 119/52 mmHg (01/19 1047) SpO2:  [95 %-98 %] 95 % (01/19 1506)  Intake/Output from previous day: 01/18 0701 - 01/19 0700 In: -  Out: 875 [Urine:875] Intake/Output this shift: Total I/O In: -  Out: 200 [Urine:200]   Recent Labs  04/30/14 1814 04/30/14 1820 05/01/14 0326 05/01/14 0855  HGB 12.6 11.4* 11.3* 10.8*    Recent Labs  05/01/14 0326 05/01/14 0855  WBC 10.9* 10.7*  RBC 3.90 3.88  HCT 35.0* 33.0*  PLT 158 180    Recent Labs  05/01/14 0855 05/03/14 0314  NA 138 138  K 4.0 3.4*  CL 105 104  CO2 24 24  BUN 22 24*  CREATININE 1.67* 1.68*  GLUCOSE 131* 120*  CALCIUM 9.0 9.3    Recent Labs  04/30/14 1820  INR 1.02    PE:  wn wd woman in nad.  R LE splinted.  NVI at toes.  R knee with eccymosis and improved effusion.  No gross deformity about the knee.  Knee films showed no fracture or dislocation.  She has arthritis at baseline.  Assessment/Plan: 3 Days Post-Op Procedure(s) (LRB): IRRIGATION AND DEBRIDEMENT EXTREMITY (Right) OPEN REDUCTION INTERNAL FIXATION (ORIF) ANKLE FRACTURE (Right) NWB on R LE.  ROM of knee is fine.  F/u with me in the office in 2-3 weeks.  I'll sign off now.  Please call [phone] with any questions.  Wylene Simmer 05/03/2014, 5:02 PM

## 2014-05-03 NOTE — Discharge Instructions (Signed)
Suzanne Simmer, MD [place]  Please read the following information regarding your care after surgery.  Weight Bearing Do not bear any weight on the operated leg or foot.  Cast / Splint / Dressing Keep your splint or cast clean and dry.  Dont put anything (coat hanger, pencil, etc) down inside of it.  If it gets damp, use a hair dryer on the cool setting to dry it.  If it gets soaked, call the office to schedule an appointment for a cast change.  After your dressing, cast or splint is removed; you may shower, but do not soak or scrub the wound.  Allow the water to run over it, and then gently pat it dry.  Swelling It is normal for you to have swelling where you had surgery.  To reduce swelling and pain, keep your toes above your nose for at least 3 days after surgery.  It may be necessary to keep your foot or leg elevated for several weeks.  If it hurts, it should be elevated.  Follow Up Call my office at [phone] when you are discharged from the hospital or surgery center to schedule an appointment to be seen two weeks after surgery.  Call my office at [phone] if you develop a fever >101.5 F, nausea, vomiting, bleeding from the surgical site or severe pain.

## 2014-05-03 NOTE — Progress Notes (Signed)
I met with pt at bedside to discuss a possible inpt rehab admission pending [place] approval. I will begin authorization. Pt is aware that SNF will need to also be pursued. I discussed with SW. [phone]

## 2014-05-03 NOTE — Clinical Social Work Placement (Addendum)
Clinical Social Work Department CLINICAL SOCIAL WORK PLACEMENT NOTE 05/03/2014  Patient:  [name], [name]  Account Number:  [id number] Admit date:  04/30/2014  Clinical Social Worker:  Barbette Or, LCSW  Date/time:  05/03/2014 03:00 PM  Clinical Social Work is seeking post-discharge placement for this patient at the following level of care:   [place]   (*CSW will update this form in Epic as items are completed)   05/03/2014  Patient/family provided with [place] Department of Clinical Social Work's list of facilities offering this level of care within the geographic area requested by the patient (or if unable, by the patient's family).  05/03/2014  Patient/family informed of their freedom to choose among providers that offer the needed level of care, that participate in Medicare, Medicaid or managed care program needed by the patient, have an available bed and are willing to accept the patient.  05/03/2014  Patient/family informed of [facility]' ownership interest in [facility], as well as of the fact that they are under no obligation to receive care at this facility.  PASARR submitted to EDS on 05/03/2014 PASARR number received on 05/03/2014  FL2 transmitted to all facilities in geographic area requested by pt/family on  05/03/2014 FL2 transmitted to all facilities within larger geographic area on   Patient informed that his/her managed care company has contracts with or will negotiate with  certain facilities, including the following:     Patient/family informed of bed offers received: 05/04/2014  Patient chooses bed at [facility] Physician recommends and patient chooses bed at    Patient to be transferred to [facility] on 05/05/2014  Patient to be transferred to facility by [facility] Patient and family notified of transfer on 05/05/2014 Name of family member notified: Patient to notify son herself per her request   The following physician  request were entered in Epic:   Additional Comments: 05/03/14 - Patient does not want to go to [facility]
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
## 2014-05-03 NOTE — Clinical Documentation Improvement (Signed)
.   ICD 10 requires specificity for open fractures of the forearm, femur, and lower leg, Which will require the provider to document the appropriate Gustilo Type l through Type [place]:  . NOTE: Even though the fracture may be described using the terminology found in the Gustilo classification the provider must document the type of Gustilo fracture present; the coder CANNOT code based on the fracture Description.  Please document the Gustilo Type of ankle fracture in the progress notes and discharge summary:  --TYPE l: The wound is smaller than 1 cm, clean, and generally caused by a fracture fragment that pierces the skin. --TYPE II: The wound is longer than 1 cm, not contaminated, and without major soft tissue damage or defect. This is also a low energy injury. --TYPE III: The wound is longer than 1 cm., with significant soft tissue disruption. The mechanism often involves high-energy trauma, resulting in a severely unstable fracture with varying degrees of fragmentation. --IIIA: The wound has sufficient soft tissue to cover the bone without the need for local or distant flap coverage. --IIIB: Disruption of the soft tissue is extensive, such that local or distant flap coverage is necessary to cover the bone. The wound may be contaminated, and serial irrigation and debridement procedures are necessary to ensure a clean surgical wound. --IIIC: Any open fracture associated with an arterial injury that requires repair is considered IIIC. Involvement of a vascular surgeon is generally required.  Thank You,  Vilinda Flake RN BSN [place] [phone]

## 2014-05-03 NOTE — Clinical Social Work Note (Signed)
Clinical Social Work Department BRIEF PSYCHOSOCIAL ASSESSMENT 05/03/2014  Patient:  [name], [name]     Account Number:  [id number]     [place] date:  04/30/2014  Clinical Social Worker:  Myles Lipps  Date/Time:  05/03/2014 12:00 N  Referred by:  Physician  Date Referred:  05/03/2014 Referred for  SNF Placement   Other Referral:   Interview type:  Patient Other interview type:   No family/friends at bedside    PSYCHOSOCIAL DATA Living Status:  FAMILY Admitted from facility:   Level of care:   Primary support name:  SHIVONNE, SCHWARTZMAN  [phone] Primary support relationship to patient:  CHILD, ADULT Degree of support available:   Strong    CURRENT CONCERNS Current Concerns  Post-Acute Placement   Other Concerns:    SOCIAL WORK ASSESSMENT / PLAN Clinical Social Worker met with patient at bedside to offer support and discuss patient needs at discharge.  Patient states that she lives at home with her son who has bilateral AKA's and is wheelchair bound.  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  Patient is hopeful for inpatient rehab but agreeable to SNF in [facility] if denied by insurance. CSW has completed FL2 and initiated search.  CSW to follow up with patient to provide available bed offers.    Clinical Social Worker inquired about current substance use.  Patient states that she does not drink or use alcohol.  SBIRT complete.  No resources needed at this time.  CSW remains available for support and to facilitate patient discharge needs once medically ready.   Assessment/plan status:  Psychosocial Support/Ongoing Assessment of Needs Other assessment/ plan:   Information/referral to community resources:   SBIRT complete.  CSW offered facility list, however patient states that she is already familiar due to having gone through the process with her son    PATIENT'S/FAMILY'S RESPONSE TO PLAN OF CARE: Patient alert and  oriented x3 laying in bed.  Patient states that she has good family support, however physically limited.  Patient plans to update her family over the phone later today.  Patient agreeable with SNF placement if insurance denies inpatient rehab.  Patient verbalized understanding of CSW role and appreciation for support.
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
## 2014-05-03 NOTE — Progress Notes (Signed)
Report called to RN on 5N.  Updated on patient history, current status, and plan of care.  Patient is stable and able to transfer at this time.
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
## 2014-05-04 ENCOUNTER — Encounter (HOSPITAL_COMMUNITY): Payer: Self-pay | Admitting: Orthopedic Surgery

## 2014-05-04 LAB — CBC
HCT: 27.7 % — ABNORMAL LOW (ref 36.0–46.0)
HEMOGLOBIN: 9.1 g/dL — AB (ref 12.0–15.0)
MCH: 27.8 pg (ref 26.0–34.0)
MCHC: 32.9 g/dL (ref 30.0–36.0)
MCV: 84.7 fL (ref 78.0–100.0)
PLATELETS: 180 10*3/uL (ref 150–400)
RBC: 3.27 MIL/uL — AB (ref 3.87–5.11)
RDW: 13.5 % (ref 11.5–15.5)
WBC: 9.1 10*3/uL (ref 4.0–10.5)

## 2014-05-04 LAB — GLUCOSE, CAPILLARY
GLUCOSE-CAPILLARY: 152 mg/dL — AB (ref 70–99)
Glucose-Capillary: 103 mg/dL — ABNORMAL HIGH (ref 70–99)
Glucose-Capillary: 109 mg/dL — ABNORMAL HIGH (ref 70–99)
Glucose-Capillary: 124 mg/dL — ABNORMAL HIGH (ref 70–99)
Glucose-Capillary: 139 mg/dL — ABNORMAL HIGH (ref 70–99)
Glucose-Capillary: 145 mg/dL — ABNORMAL HIGH (ref 70–99)
Glucose-Capillary: 86 mg/dL (ref 70–99)

## 2014-05-04 NOTE — Progress Notes (Signed)
I await [employer] insurance decision to possibly admit pt to inpt rehab today. [id number]

## 2014-05-04 NOTE — Progress Notes (Signed)
[employer] medicare will not approve inpt rehab admission. I have notified pt, SW, and RN CM. We will sign off. [phone]

## 2014-05-04 NOTE — Progress Notes (Signed)
UR completed.  CIR vs SNF at d/c depending on insurance approvals.  Sandi Mariscal, RN BSN [place] CCM Trauma/Neuro ICU Case Manager [phone]
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
## 2014-05-04 NOTE — Clinical Social Work Note (Signed)
Clinical Social Worker continuing to follow patient and family for support and discharge planning needs.  Patient has chosen to accept bed offer at [facility].  CSW has communicated with [facility] who states that patient is appropriate for admission tomorrow pending insurance authorization from [place].  CSW has contacted Tye [place] [phone] at [facility] regarding authorization and awaiting return call.  CSW remains available for support and to facilitate patient discharge needs once medically ready.  Barbette Or, [place]

## 2014-05-05 ENCOUNTER — Encounter (HOSPITAL_COMMUNITY): Payer: Self-pay | Admitting: General Practice

## 2014-05-05 LAB — GLUCOSE, CAPILLARY
Glucose-Capillary: 93 mg/dL (ref 70–99)
Glucose-Capillary: 81 mg/dL (ref 70–99)
Glucose-Capillary: 127 mg/dL — ABNORMAL HIGH (ref 70–99)

## 2014-05-05 LAB — CBC
HCT: 27.3 % — ABNORMAL LOW (ref 36.0–46.0)
HEMOGLOBIN: 8.9 g/dL — AB (ref 12.0–15.0)
MCH: 27.7 pg (ref 26.0–34.0)
MCHC: 32.6 g/dL (ref 30.0–36.0)
MCV: 85 fL (ref 78.0–100.0)
Platelets: 202 10*3/uL (ref 150–400)
RBC: 3.21 MIL/uL — ABNORMAL LOW (ref 3.87–5.11)
RDW: 13.5 % (ref 11.5–15.5)
WBC: 7.4 10*3/uL (ref 4.0–10.5)

## 2014-05-05 MED ORDER — TRAMADOL HCL 50 MG PO TABS: 50.0000 mg | ORAL_TABLET | Freq: Four times a day (QID) | ORAL | Status: DC

## 2014-05-05 MED ORDER — OXYCODONE-ACETAMINOPHEN 5-325 MG PO TABS: 1.0000 | ORAL_TABLET | ORAL | Status: DC | PRN

## 2014-05-05 MED ORDER — ENOXAPARIN SODIUM 30 MG/0.3ML ~~LOC~~ SOLN: 30.0000 mg | Freq: Two times a day (BID) | SUBCUTANEOUS | Status: DC
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
## 2014-05-05 NOTE — Clinical Social Work Note (Signed)
Clinical Social Worker facilitated patient discharge including contacting patient family and facility to confirm patient discharge plans.  Clinical information faxed to facility and family agreeable with plan.  CSW received notification of insurance authorization.  CSW arranged ambulance transport via [facility] to [facility].  RN to call report prior to discharge.  Clinical Social Worker will sign off for now as social work intervention is no longer needed. Please consult [place] again if new need arises.  Barbette Or, [place]
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
## 2014-05-05 NOTE — Progress Notes (Signed)
Resident being D/C to SNF. Called in report to [facility].

## 2014-08-04 ENCOUNTER — Encounter (HOSPITAL_COMMUNITY)
Admission: RE | Admit: 2014-08-04 | Discharge: 2014-08-04 | Disposition: A | Discharge status: Home / Self Care | Payer: Medicare PPO | Source: Ambulatory Visit | Attending: Orthopedic Surgery | Admitting: Orthopedic Surgery

## 2014-08-04 ENCOUNTER — Encounter (HOSPITAL_COMMUNITY): Payer: Self-pay

## 2014-08-04 DIAGNOSIS — E785 Hyperlipidemia, unspecified: Secondary | ICD-10-CM | POA: Diagnosis not present

## 2014-08-04 DIAGNOSIS — I129 Hypertensive chronic kidney disease with stage 1 through stage 4 chronic kidney disease, or unspecified chronic kidney disease: Secondary | ICD-10-CM | POA: Diagnosis not present

## 2014-08-04 DIAGNOSIS — M25571 Pain in right ankle and joints of right foot: Secondary | ICD-10-CM | POA: Insufficient documentation

## 2014-08-04 DIAGNOSIS — N183 Chronic kidney disease, stage 3 (moderate): Secondary | ICD-10-CM | POA: Insufficient documentation

## 2014-08-04 DIAGNOSIS — Z01818 Encounter for other preprocedural examination: Secondary | ICD-10-CM | POA: Insufficient documentation

## 2014-08-04 DIAGNOSIS — I2510 Atherosclerotic heart disease of native coronary artery without angina pectoris: Secondary | ICD-10-CM | POA: Insufficient documentation

## 2014-08-04 DIAGNOSIS — G8918 Other acute postprocedural pain: Secondary | ICD-10-CM | POA: Diagnosis not present

## 2014-08-04 DIAGNOSIS — Z79899 Other long term (current) drug therapy: Secondary | ICD-10-CM | POA: Diagnosis not present

## 2014-08-04 DIAGNOSIS — Y838 Other surgical procedures as the cause of abnormal reaction of the patient, or of later complication, without mention of misadventure at the time of the procedure: Secondary | ICD-10-CM | POA: Diagnosis not present

## 2014-08-04 DIAGNOSIS — Z01812 Encounter for preprocedural laboratory examination: Secondary | ICD-10-CM | POA: Insufficient documentation

## 2014-08-04 DIAGNOSIS — Z7982 Long term (current) use of aspirin: Secondary | ICD-10-CM | POA: Diagnosis not present

## 2014-08-04 DIAGNOSIS — E119 Type 2 diabetes mellitus without complications: Secondary | ICD-10-CM | POA: Insufficient documentation

## 2014-08-04 DIAGNOSIS — T8484XA Pain due to internal orthopedic prosthetic devices, implants and grafts, initial encounter: Secondary | ICD-10-CM | POA: Insufficient documentation

## 2014-08-04 HISTORY — DX: Edema, unspecified: R60.9

## 2014-08-04 HISTORY — DX: Localized edema: R60.0

## 2014-08-04 HISTORY — DX: Unspecified cataract: H26.9

## 2014-08-04 HISTORY — DX: Personal history of other diseases of the respiratory system: Z87.09

## 2014-08-04 HISTORY — DX: Pain in unspecified joint: M25.50

## 2014-08-04 LAB — CBC
HCT: 35 % — ABNORMAL LOW (ref 36.0–46.0)
HEMOGLOBIN: 11.4 g/dL — AB (ref 12.0–15.0)
MCH: 27 pg (ref 26.0–34.0)
MCHC: 32.6 g/dL (ref 30.0–36.0)
MCV: 82.9 fL (ref 78.0–100.0)
Platelets: 201 10*3/uL (ref 150–400)
RBC: 4.22 MIL/uL (ref 3.87–5.11)
RDW: 14.7 % (ref 11.5–15.5)
WBC: 7.3 10*3/uL (ref 4.0–10.5)

## 2014-08-04 LAB — SURGICAL PCR SCREEN
MRSA, PCR: NEGATIVE
Staphylococcus aureus: POSITIVE — AB

## 2014-08-04 LAB — BASIC METABOLIC PANEL
Anion gap: 11 (ref 5–15)
BUN: 30 mg/dL — AB (ref 6–23)
CALCIUM: 9.6 mg/dL (ref 8.4–10.5)
CO2: 21 mmol/L (ref 19–32)
Chloride: 108 mmol/L (ref 96–112)
Creatinine, Ser: 1.89 mg/dL — ABNORMAL HIGH (ref 0.50–1.10)
GFR calc Af Amer: 29 mL/min — ABNORMAL LOW (ref >90)
GFR, EST NON AFRICAN AMERICAN: 25 mL/min — AB (ref >90)
Glucose, Bld: 109 mg/dL — ABNORMAL HIGH (ref 70–99)
Potassium: 4.2 mmol/L (ref 3.5–5.1)
SODIUM: 140 mmol/L (ref 135–145)

## 2014-08-04 NOTE — Progress Notes (Signed)
Cardiologist is Dr.Caldwell in [place] visit >58yrs ago  Heart cath/Stress test done around 2009  EKG in epic from 04/30/14  Echo report in epic from 2016  Denies CXR in past yr  Medical Md is Dr.Tejan Con Memos

## 2014-08-04 NOTE — Pre-Procedure Instructions (Signed)
BERINA GIESEKING  08/04/2014   Your procedure is scheduled on:  Thurs, April 28 @ 9:15 AM  Report to [facility] Entrance A and go to Admitting at 7:15 AM.  Call this number if you have problems the morning of surgery: [phone]   Remember:   Do not eat food or drink liquids after midnight.   Take these medicines the morning of surgery with A SIP OF WATER: Apresoline(Hydralazine),Metoprolol(Toprol),Zoloft(Sertraline),and Pain Pill(if needed)               Stop taking your Aspirin as you have been instructed. No Goody's,BC's,Aleve,Ibuprofen,Fish Oil,or any Herbal Medications.    Do not wear jewelry, make-up or nail polish.  Do not wear lotions, powders, or perfumes. You may wear deodorant.  Do not shave 48 hours prior to surgery.   Do not bring valuables to the hospital.  [facility] is not responsible                  for any belongings or valuables.               Contacts, dentures or bridgework may not be worn into surgery.  Leave suitcase in the car. After surgery it may be brought to your room.  For patients admitted to the hospital, discharge time is determined by your                treatment team.               Patients discharged the day of surgery will not be allowed to drive  home.    Special Instructions:  [place] - Preparing for Surgery  Before surgery, you can play an important role.  Because skin is not sterile, your skin needs to be as free of germs as possible.  You can reduce the number of germs on you skin by washing with CHG (chlorahexidine gluconate) soap before surgery.  CHG is an antiseptic cleaner which kills germs and bonds with the skin to continue killing germs even after washing.  Please DO NOT use if you have an allergy to CHG or antibacterial soaps.  If your skin becomes reddened/irritated stop using the CHG and inform your nurse when you arrive at Short Stay.  Do not shave (including legs and underarms) for at least 48 hours prior to the first CHG  shower.  You may shave your face.  Please follow these instructions carefully:   1.  Shower with CHG Soap the night before surgery and the                                morning of Surgery.  2.  If you choose to wash your hair, wash your hair first as usual with your       normal shampoo.  3.  After you shampoo, rinse your hair and body thoroughly to remove the                      Shampoo.  4.  Use CHG as you would any other liquid soap.  You can apply chg directly       to the skin and wash gently with scrungie or a clean washcloth.  5.  Apply the CHG Soap to your body ONLY FROM THE NECK DOWN.        Do not use on open wounds or open sores.  Avoid contact  with your eyes,       ears, mouth and genitals (private parts).  Wash genitals (private parts)       with your normal soap.  6.  Wash thoroughly, paying special attention to the area where your surgery        will be performed.  7.  Thoroughly rinse your body with warm water from the neck down.  8.  DO NOT shower/wash with your normal soap after using and rinsing off       the CHG Soap.  9.  Pat yourself dry with a clean towel.            10.  Wear clean pajamas.            11.  Place clean sheets on your bed the night of your first shower and do not        sleep with pets.  Day of Surgery  Do not apply any lotions/deoderants the morning of surgery.  Please wear clean clothes to the hospital/surgery center.     Please read over the following fact sheets that you were given: Pain Booklet, Coughing and Deep Breathing, MRSA Information and Surgical Site Infection Prevention
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
## 2014-08-10 ENCOUNTER — Encounter (HOSPITAL_COMMUNITY): Payer: Self-pay

## 2014-08-10 NOTE — Progress Notes (Signed)
Call to [facility], requested preop orders to be put in EPIC by Dr. Doran Durand. Spoke with [employer]

## 2014-08-11 ENCOUNTER — Inpatient Hospital Stay (HOSPITAL_COMMUNITY)
Admission: RE | Admit: 2014-08-11 | Discharge: 2014-08-15 | DRG: 908 | Disposition: A | Discharge status: Home Health | Payer: Medicare PPO | Source: Ambulatory Visit | Attending: Orthopedic Surgery | Admitting: Orthopedic Surgery

## 2014-08-11 ENCOUNTER — Inpatient Hospital Stay (HOSPITAL_COMMUNITY): Payer: Medicare PPO | Admitting: Emergency Medicine

## 2014-08-11 ENCOUNTER — Encounter (HOSPITAL_COMMUNITY)
Admission: RE | Disposition: A | Discharge status: Home Health | Payer: Self-pay | Source: Ambulatory Visit | Attending: Orthopedic Surgery

## 2014-08-11 ENCOUNTER — Inpatient Hospital Stay (HOSPITAL_COMMUNITY): Payer: Medicare PPO | Admitting: Certified Registered Nurse Anesthetist

## 2014-08-11 DIAGNOSIS — Z6832 Body mass index (BMI) 32.0-32.9, adult: Secondary | ICD-10-CM

## 2014-08-11 DIAGNOSIS — Y838 Other surgical procedures as the cause of abnormal reaction of the patient, or of later complication, without mention of misadventure at the time of the procedure: Secondary | ICD-10-CM | POA: Diagnosis not present

## 2014-08-11 DIAGNOSIS — I129 Hypertensive chronic kidney disease with stage 1 through stage 4 chronic kidney disease, or unspecified chronic kidney disease: Secondary | ICD-10-CM | POA: Diagnosis present

## 2014-08-11 DIAGNOSIS — E119 Type 2 diabetes mellitus without complications: Secondary | ICD-10-CM | POA: Diagnosis present

## 2014-08-11 DIAGNOSIS — S91009A Unspecified open wound, unspecified ankle, initial encounter: Secondary | ICD-10-CM | POA: Diagnosis not present

## 2014-08-11 DIAGNOSIS — Z951 Presence of aortocoronary bypass graft: Secondary | ICD-10-CM | POA: Diagnosis not present

## 2014-08-11 DIAGNOSIS — I2510 Atherosclerotic heart disease of native coronary artery without angina pectoris: Secondary | ICD-10-CM | POA: Diagnosis present

## 2014-08-11 DIAGNOSIS — B9561 Methicillin susceptible Staphylococcus aureus infection as the cause of diseases classified elsewhere: Secondary | ICD-10-CM

## 2014-08-11 DIAGNOSIS — F319 Bipolar disorder, unspecified: Secondary | ICD-10-CM | POA: Diagnosis present

## 2014-08-11 DIAGNOSIS — N183 Chronic kidney disease, stage 3 (moderate): Secondary | ICD-10-CM | POA: Diagnosis present

## 2014-08-11 DIAGNOSIS — M869 Osteomyelitis, unspecified: Secondary | ICD-10-CM | POA: Diagnosis present

## 2014-08-11 DIAGNOSIS — T8132XA Disruption of internal operation (surgical) wound, not elsewhere classified, initial encounter: Secondary | ICD-10-CM | POA: Diagnosis not present

## 2014-08-11 DIAGNOSIS — I441 Atrioventricular block, second degree: Secondary | ICD-10-CM | POA: Diagnosis present

## 2014-08-11 DIAGNOSIS — T8130XA Disruption of wound, unspecified, initial encounter: Principal | ICD-10-CM | POA: Diagnosis present

## 2014-08-11 DIAGNOSIS — E785 Hyperlipidemia, unspecified: Secondary | ICD-10-CM | POA: Diagnosis present

## 2014-08-11 DIAGNOSIS — Y831 Surgical operation with implant of artificial internal device as the cause of abnormal reaction of the patient, or of later complication, without mention of misadventure at the time of the procedure: Secondary | ICD-10-CM | POA: Diagnosis present

## 2014-08-11 DIAGNOSIS — Z7982 Long term (current) use of aspirin: Secondary | ICD-10-CM | POA: Diagnosis not present

## 2014-08-11 DIAGNOSIS — M79604 Pain in right leg: Secondary | ICD-10-CM | POA: Diagnosis not present

## 2014-08-11 HISTORY — PX: REMOVAL OF IMPLANT: SHX6451

## 2014-08-11 HISTORY — PX: INCISION AND DRAINAGE: SHX5863

## 2014-08-11 LAB — GLUCOSE, CAPILLARY
Glucose-Capillary: 130 mg/dL — ABNORMAL HIGH (ref 70–99)
Glucose-Capillary: 134 mg/dL — ABNORMAL HIGH (ref 70–99)
Glucose-Capillary: 87 mg/dL (ref 70–99)
Glucose-Capillary: 121 mg/dL — ABNORMAL HIGH (ref 70–99)

## 2014-08-11 LAB — CREATININE, SERUM
Creatinine, Ser: 1.6 mg/dL — ABNORMAL HIGH (ref 0.50–1.10)
GFR, EST AFRICAN AMERICAN: 36 mL/min — AB (ref >90)
GFR, EST NON AFRICAN AMERICAN: 31 mL/min — AB (ref >90)

## 2014-08-11 LAB — CBC
HCT: 33.2 % — ABNORMAL LOW (ref 36.0–46.0)
Hemoglobin: 10.6 g/dL — ABNORMAL LOW (ref 12.0–15.0)
MCH: 26.5 pg (ref 26.0–34.0)
MCHC: 31.9 g/dL (ref 30.0–36.0)
MCV: 83 fL (ref 78.0–100.0)
PLATELETS: 179 10*3/uL (ref 150–400)
RBC: 4 MIL/uL (ref 3.87–5.11)
RDW: 14.9 % (ref 11.5–15.5)
WBC: 8.6 10*3/uL (ref 4.0–10.5)

## 2014-08-11 SURGERY — REMOVAL OF IMPLANT
Anesthesia: General | Site: Ankle | Laterality: Right

## 2014-08-11 MED ORDER — MUPIROCIN 2 % EX OINT: 1.0000 "application " | TOPICAL_OINTMENT | Freq: Once | CUTANEOUS | Status: DC

## 2014-08-11 MED ORDER — ONDANSETRON HCL 4 MG/2ML IJ SOLN
INTRAMUSCULAR | Status: AC
  Filled 2014-08-11: qty 2

## 2014-08-11 MED ORDER — PIPERACILLIN-TAZOBACTAM 3.375 G IVPB 30 MIN
3.3750 g | Freq: Once | INTRAVENOUS | Status: DC
  Filled 2014-08-11: qty 50

## 2014-08-11 MED ORDER — SERTRALINE HCL 50 MG PO TABS
50.0000 mg | ORAL_TABLET | Freq: Every day | ORAL | Status: DC
  Administered 2014-08-12 – 2014-08-15 (×4): 50 mg via ORAL
  Filled 2014-08-11 (×5): qty 1

## 2014-08-11 MED ORDER — SODIUM CHLORIDE 0.9 % IR SOLN
Status: DC | PRN
  Administered 2014-08-11: 3000 mL
  Administered 2014-08-11: 1000 mL

## 2014-08-11 MED ORDER — ENOXAPARIN SODIUM 30 MG/0.3ML ~~LOC~~ SOLN
30.0000 mg | SUBCUTANEOUS | Status: DC
  Administered 2014-08-12 – 2014-08-14 (×3): 30 mg via SUBCUTANEOUS
  Filled 2014-08-11 (×3): qty 0.3

## 2014-08-11 MED ORDER — LIDOCAINE HCL (CARDIAC) 20 MG/ML IV SOLN
INTRAVENOUS | Status: DC | PRN
  Administered 2014-08-11: 60 mg via INTRAVENOUS

## 2014-08-11 MED ORDER — ROCURONIUM BROMIDE 50 MG/5ML IV SOLN
INTRAVENOUS | Status: AC
  Filled 2014-08-11: qty 1

## 2014-08-11 MED ORDER — LISINOPRIL 40 MG PO TABS
40.0000 mg | ORAL_TABLET | Freq: Every day | ORAL | Status: DC
  Administered 2014-08-15: 40 mg via ORAL
  Filled 2014-08-11: qty 1

## 2014-08-11 MED ORDER — ONDANSETRON HCL 4 MG/2ML IJ SOLN: 4.0000 mg | Freq: Four times a day (QID) | INTRAMUSCULAR | Status: DC | PRN

## 2014-08-11 MED ORDER — FENTANYL CITRATE (PF) 250 MCG/5ML IJ SOLN
INTRAMUSCULAR | Status: AC
  Filled 2014-08-11: qty 5

## 2014-08-11 MED ORDER — METOCLOPRAMIDE HCL 5 MG/ML IJ SOLN: 5.0000 mg | Freq: Three times a day (TID) | INTRAMUSCULAR | Status: DC | PRN

## 2014-08-11 MED ORDER — HYDROMORPHONE HCL 1 MG/ML IJ SOLN
INTRAMUSCULAR | Status: AC
  Filled 2014-08-11: qty 1

## 2014-08-11 MED ORDER — VANCOMYCIN HCL IN DEXTROSE 1-5 GM/200ML-% IV SOLN
1000.0000 mg | INTRAVENOUS | Status: DC
  Filled 2014-08-11: qty 200

## 2014-08-11 MED ORDER — PROPOFOL 10 MG/ML IV BOLUS
INTRAVENOUS | Status: AC
  Filled 2014-08-11: qty 20

## 2014-08-11 MED ORDER — INSULIN ASPART 100 UNIT/ML ~~LOC~~ SOLN
0.0000 [IU] | Freq: Three times a day (TID) | SUBCUTANEOUS | Status: DC
  Administered 2014-08-12 – 2014-08-15 (×5): 2 [IU] via SUBCUTANEOUS

## 2014-08-11 MED ORDER — VANCOMYCIN HCL 1000 MG IV SOLR
1000.0000 mg | INTRAVENOUS | Status: DC | PRN
  Administered 2014-08-11: 1000 mg via INTRAVENOUS

## 2014-08-11 MED ORDER — MAGNESIUM HYDROXIDE 400 MG/5ML PO SUSP: 30.0000 mL | Freq: Every day | ORAL | Status: DC | PRN

## 2014-08-11 MED ORDER — ARTIFICIAL TEARS OP OINT
TOPICAL_OINTMENT | OPHTHALMIC | Status: AC
  Filled 2014-08-11: qty 3.5

## 2014-08-11 MED ORDER — PIPERACILLIN-TAZOBACTAM 3.375 G IVPB
3.3750 g | Freq: Three times a day (TID) | INTRAVENOUS | Status: DC
  Administered 2014-08-12 – 2014-08-13 (×5): 3.375 g via INTRAVENOUS
  Filled 2014-08-11 (×6): qty 50

## 2014-08-11 MED ORDER — LINAGLIPTIN 5 MG PO TABS
5.0000 mg | ORAL_TABLET | Freq: Every day | ORAL | Status: DC
  Administered 2014-08-11 – 2014-08-15 (×5): 5 mg via ORAL
  Filled 2014-08-11 (×5): qty 1

## 2014-08-11 MED ORDER — METOCLOPRAMIDE HCL 5 MG PO TABS: 5.0000 mg | ORAL_TABLET | Freq: Three times a day (TID) | ORAL | Status: DC | PRN

## 2014-08-11 MED ORDER — OXYCODONE HCL 5 MG PO TABS
5.0000 mg | ORAL_TABLET | ORAL | Status: DC | PRN
  Administered 2014-08-11 – 2014-08-12 (×7): 10 mg via ORAL
  Administered 2014-08-13: 5 mg via ORAL
  Administered 2014-08-13: 10 mg via ORAL
  Administered 2014-08-13: 5 mg via ORAL
  Administered 2014-08-14: 10 mg via ORAL
  Administered 2014-08-14 – 2014-08-15 (×2): 5 mg via ORAL
  Filled 2014-08-11 (×3): qty 2
  Filled 2014-08-11 (×2): qty 1
  Filled 2014-08-11: qty 2
  Filled 2014-08-11: qty 1
  Filled 2014-08-11 (×5): qty 2
  Filled 2014-08-11: qty 1

## 2014-08-11 MED ORDER — ATORVASTATIN CALCIUM 40 MG PO TABS
40.0000 mg | ORAL_TABLET | Freq: Every day | ORAL | Status: DC
  Administered 2014-08-11 – 2014-08-14 (×4): 40 mg via ORAL
  Filled 2014-08-11 (×4): qty 1

## 2014-08-11 MED ORDER — FENTANYL CITRATE (PF) 100 MCG/2ML IJ SOLN
INTRAMUSCULAR | Status: DC | PRN
Start: 2014-08-11 — End: 2014-08-11
  Administered 2014-08-11: 50 ug via INTRAVENOUS
  Administered 2014-08-11: 100 ug via INTRAVENOUS
  Administered 2014-08-11 (×4): 25 ug via INTRAVENOUS

## 2014-08-11 MED ORDER — GLYCOPYRROLATE 0.2 MG/ML IJ SOLN
INTRAMUSCULAR | Status: AC
  Filled 2014-08-11: qty 1

## 2014-08-11 MED ORDER — HYDROMORPHONE HCL 1 MG/ML IJ SOLN
0.2500 mg | INTRAMUSCULAR | Status: DC | PRN
  Administered 2014-08-11 (×2): 0.5 mg via INTRAVENOUS

## 2014-08-11 MED ORDER — LIDOCAINE HCL (CARDIAC) 20 MG/ML IV SOLN
INTRAVENOUS | Status: AC
  Filled 2014-08-11: qty 5

## 2014-08-11 MED ORDER — OXYCODONE HCL 5 MG/5ML PO SOLN: 5.0000 mg | Freq: Once | ORAL | Status: DC | PRN

## 2014-08-11 MED ORDER — VANCOMYCIN HCL IN DEXTROSE 1-5 GM/200ML-% IV SOLN
INTRAVENOUS | Status: AC
  Filled 2014-08-11: qty 200

## 2014-08-11 MED ORDER — SODIUM CHLORIDE 0.9 % IV SOLN
INTRAVENOUS | Status: DC
  Administered 2014-08-11: 08:00:00 via INTRAVENOUS

## 2014-08-11 MED ORDER — SODIUM CHLORIDE 0.9 % IV SOLN
INTRAVENOUS | Status: DC | PRN
  Administered 2014-08-11: 09:00:00 via INTRAVENOUS

## 2014-08-11 MED ORDER — PROPOFOL 10 MG/ML IV BOLUS
INTRAVENOUS | Status: DC | PRN
Start: 2014-08-11 — End: 2014-08-11
  Administered 2014-08-11: 150 mg via INTRAVENOUS

## 2014-08-11 MED ORDER — CHLORTHALIDONE 25 MG PO TABS
25.0000 mg | ORAL_TABLET | Freq: Every day | ORAL | Status: DC
  Administered 2014-08-14 – 2014-08-15 (×2): 25 mg via ORAL
  Filled 2014-08-11 (×5): qty 1

## 2014-08-11 MED ORDER — SODIUM CHLORIDE 0.9 % IV SOLN
INTRAVENOUS | Status: DC
  Administered 2014-08-12: via INTRAVENOUS

## 2014-08-11 MED ORDER — SENNA 8.6 MG PO TABS
1.0000 | ORAL_TABLET | Freq: Two times a day (BID) | ORAL | Status: DC
  Administered 2014-08-11 – 2014-08-15 (×7): 8.6 mg via ORAL
  Filled 2014-08-11 (×8): qty 1

## 2014-08-11 MED ORDER — ASPIRIN EC 325 MG PO TBEC
325.0000 mg | DELAYED_RELEASE_TABLET | Freq: Every day | ORAL | Status: DC
  Administered 2014-08-12 – 2014-08-15 (×5): 325 mg via ORAL
  Filled 2014-08-11 (×5): qty 1

## 2014-08-11 MED ORDER — ONDANSETRON HCL 4 MG PO TABS: 4.0000 mg | ORAL_TABLET | Freq: Four times a day (QID) | ORAL | Status: DC | PRN

## 2014-08-11 MED ORDER — VANCOMYCIN HCL 500 MG IV SOLR
500.0000 mg | Freq: Once | INTRAVENOUS | Status: AC
  Administered 2014-08-11: 500 mg via INTRAVENOUS
  Filled 2014-08-11: qty 500

## 2014-08-11 MED ORDER — MUPIROCIN 2 % EX OINT
TOPICAL_OINTMENT | CUTANEOUS | Status: AC
  Administered 2014-08-11: 08:00:00 via NASAL
  Filled 2014-08-11: qty 22

## 2014-08-11 MED ORDER — ACETAMINOPHEN 500 MG PO TABS
1000.0000 mg | ORAL_TABLET | Freq: Four times a day (QID) | ORAL | Status: AC
  Administered 2014-08-11 – 2014-08-12 (×4): 1000 mg via ORAL
  Filled 2014-08-11 (×5): qty 2

## 2014-08-11 MED ORDER — DOCUSATE SODIUM 100 MG PO CAPS
100.0000 mg | ORAL_CAPSULE | Freq: Two times a day (BID) | ORAL | Status: DC
  Administered 2014-08-11 – 2014-08-15 (×7): 100 mg via ORAL
  Filled 2014-08-11 (×8): qty 1

## 2014-08-11 MED ORDER — HYDRALAZINE HCL 50 MG PO TABS
50.0000 mg | ORAL_TABLET | Freq: Three times a day (TID) | ORAL | Status: DC
  Administered 2014-08-11 – 2014-08-15 (×5): 50 mg via ORAL
  Filled 2014-08-11 (×5): qty 1

## 2014-08-11 MED ORDER — OXYCODONE HCL 5 MG PO TABS: 5.0000 mg | ORAL_TABLET | Freq: Once | ORAL | Status: DC | PRN

## 2014-08-11 MED ORDER — LACTATED RINGERS IV SOLN
INTRAVENOUS | Status: DC | PRN
  Administered 2014-08-11: 09:00:00 via INTRAVENOUS

## 2014-08-11 MED ORDER — CHLORHEXIDINE GLUCONATE 4 % EX LIQD
60.0000 mL | Freq: Once | CUTANEOUS | Status: DC
  Filled 2014-08-11: qty 60

## 2014-08-11 MED ORDER — MAGNESIUM CITRATE PO SOLN: 1.0000 | Freq: Once | ORAL | Status: AC | PRN

## 2014-08-11 MED ORDER — HYDROMORPHONE HCL 1 MG/ML IJ SOLN
0.5000 mg | INTRAMUSCULAR | Status: DC | PRN
  Administered 2014-08-11 – 2014-08-12 (×2): 0.5 mg via INTRAVENOUS
  Filled 2014-08-11 (×2): qty 1

## 2014-08-11 MED ORDER — ONDANSETRON HCL 4 MG/2ML IJ SOLN
INTRAMUSCULAR | Status: DC | PRN
  Administered 2014-08-11: 4 mg via INTRAVENOUS

## 2014-08-11 SURGICAL SUPPLY — 62 items
BANDAGE ELASTIC 4 VELCRO ST LF (GAUZE/BANDAGES/DRESSINGS) ×4 IMPLANT
BANDAGE ESMARK 6X9 LF (GAUZE/BANDAGES/DRESSINGS) ×2 IMPLANT
BLADE SURG 10 STRL SS (BLADE) ×4 IMPLANT
BNDG COHESIVE 4X5 TAN STRL (GAUZE/BANDAGES/DRESSINGS) ×4 IMPLANT
BNDG COHESIVE 6X5 TAN STRL LF (GAUZE/BANDAGES/DRESSINGS) ×4 IMPLANT
BNDG CONFORM 3 STRL LF (GAUZE/BANDAGES/DRESSINGS) ×4 IMPLANT
BNDG ESMARK 6X9 LF (GAUZE/BANDAGES/DRESSINGS) ×4
CANISTER SUCT 3000ML PPV (MISCELLANEOUS) ×4 IMPLANT
CHLORAPREP W/TINT 26ML (MISCELLANEOUS) ×4 IMPLANT
COVER SURGICAL LIGHT HANDLE (MISCELLANEOUS) ×4 IMPLANT
CUFF TOURNIQUET SINGLE 34IN LL (TOURNIQUET CUFF) ×4 IMPLANT
CUFF TOURNIQUET SINGLE 44IN (TOURNIQUET CUFF) IMPLANT
DRAIN CHANNEL 10M FLAT 3/4 FLT (DRAIN) IMPLANT
DRAIN PENROSE 1/2X12 LTX STRL (WOUND CARE) IMPLANT
DRAPE C-ARM 42X72 X-RAY (DRAPES) ×4 IMPLANT
DRAPE U-SHAPE 47X51 STRL (DRAPES) ×4 IMPLANT
DRSG ADAPTIC 3X8 NADH LF (GAUZE/BANDAGES/DRESSINGS) ×4 IMPLANT
DRSG MEPITEL 4X7.2 (GAUZE/BANDAGES/DRESSINGS) ×4 IMPLANT
DRSG PAD ABDOMINAL 8X10 ST (GAUZE/BANDAGES/DRESSINGS) ×4 IMPLANT
ELECT REM PT RETURN 9FT ADLT (ELECTROSURGICAL) ×4
ELECTRODE REM PT RTRN 9FT ADLT (ELECTROSURGICAL) ×2 IMPLANT
EVACUATOR SILICONE 100CC (DRAIN) IMPLANT
GAUZE SPONGE 4X4 12PLY STRL (GAUZE/BANDAGES/DRESSINGS) ×4 IMPLANT
GLOVE BIO SURGEON STRL SZ7 (GLOVE) ×8 IMPLANT
GLOVE BIO SURGEON STRL SZ8 (GLOVE) ×4 IMPLANT
GLOVE BIOGEL PI IND STRL 7.5 (GLOVE) ×2 IMPLANT
GLOVE BIOGEL PI IND STRL 8 (GLOVE) ×2 IMPLANT
GLOVE BIOGEL PI INDICATOR 7.5 (GLOVE) ×2
GLOVE BIOGEL PI INDICATOR 8 (GLOVE) ×2
GOWN STRL REUS W/ TWL LRG LVL3 (GOWN DISPOSABLE) ×4 IMPLANT
GOWN STRL REUS W/ TWL XL LVL3 (GOWN DISPOSABLE) ×2 IMPLANT
GOWN STRL REUS W/TWL LRG LVL3 (GOWN DISPOSABLE) ×4
GOWN STRL REUS W/TWL XL LVL3 (GOWN DISPOSABLE) ×2
KIT BASIN OR (CUSTOM PROCEDURE TRAY) ×4 IMPLANT
KIT ROOM TURNOVER OR (KITS) ×4 IMPLANT
NEEDLE 22X1 1/2 (OR ONLY) (NEEDLE) ×4 IMPLANT
NS IRRIG 1000ML POUR BTL (IV SOLUTION) ×4 IMPLANT
PACK ORTHO EXTREMITY (CUSTOM PROCEDURE TRAY) ×4 IMPLANT
PAD ARMBOARD 7.5X6 YLW CONV (MISCELLANEOUS) ×8 IMPLANT
PAD CAST 4YDX4 CTTN HI CHSV (CAST SUPPLIES) ×2 IMPLANT
PADDING CAST COTTON 4X4 STRL (CAST SUPPLIES) ×2
PADDING CAST COTTON 6X4 STRL (CAST SUPPLIES) ×4 IMPLANT
SET CYSTO W/LG BORE CLAMP LF (SET/KITS/TRAYS/PACK) ×8 IMPLANT
SOAP 2 % CHG 4 OZ (WOUND CARE) ×4 IMPLANT
SPONGE LAP 4X18 X RAY DECT (DISPOSABLE) ×4 IMPLANT
STAPLER VISISTAT 35W (STAPLE) IMPLANT
SUCTION FRAZIER TIP 10 FR DISP (SUCTIONS) ×4 IMPLANT
SUT ETHILON 2 0 PSLX (SUTURE) ×4 IMPLANT
SUT ETHILON 3 0 FSL (SUTURE) ×4 IMPLANT
SUT PROLENE 3 0 PS 2 (SUTURE) ×4 IMPLANT
SUT VIC AB 2-0 CT1 27 (SUTURE) ×4
SUT VIC AB 2-0 CT1 TAPERPNT 27 (SUTURE) ×4 IMPLANT
SUT VIC AB 3-0 PS2 18 (SUTURE) ×2
SUT VIC AB 3-0 PS2 18XBRD (SUTURE) ×2 IMPLANT
SYR CONTROL 10ML LL (SYRINGE) ×4 IMPLANT
TOWEL OR 17X24 6PK STRL BLUE (TOWEL DISPOSABLE) ×4 IMPLANT
TOWEL OR 17X26 10 PK STRL BLUE (TOWEL DISPOSABLE) ×4 IMPLANT
TUBE CONNECTING 12'X1/4 (SUCTIONS) ×1
TUBE CONNECTING 12X1/4 (SUCTIONS) ×3 IMPLANT
TUBING CYSTO DISP (UROLOGICAL SUPPLIES) ×4 IMPLANT
WATER STERILE IRR 1000ML POUR (IV SOLUTION) ×4 IMPLANT
YANKAUER SUCT BULB TIP NO VENT (SUCTIONS) ×4 IMPLANT

## 2014-08-11 NOTE — Brief Op Note (Signed)
08/11/2014  10:12 AM  PATIENT:  [name]  74 y.o. female  PRE-OPERATIVE DIAGNOSIS:  Right ankle lateral wound dehiscence with exposed hardware  POST-OPERATIVE DIAGNOSIS:  same  Procedure(s): 1.  Removal of deep implants from the right fibula 2.  Removal of deep implants from the right tibia 3.  Irrigation and excisional debridement of the right lateral ankle wound including skin, subcutaneous tissue, muscle and bone  SURGEON:  Wylene Simmer, MD  ASSISTANT: n/a  ANESTHESIA:   General  EBL:  minimal   TOURNIQUET:   Total Tourniquet Time Documented: Thigh (Right) - 39 minutes Total: Thigh (Right) - 39 minutes  Specimen:  Deep tissue to micro for aerobic and anaerobic culture  COMPLICATIONS:  None apparent  DISPOSITION:  Extubated, awake and stable to recovery.  DICTATION ID:  [id number]

## 2014-08-11 NOTE — OR Nursing (Signed)
Dr. Marcie Bal and Dr. Doran Durand aware of EKG changes. Orders received, cardiology consult pending.

## 2014-08-11 NOTE — H&P (Signed)
[name] is an 74 y.o. female.   Chief Complaint:  Right leg wound dehiscence HPI:  74 y/o female with PMH of diabetes presents today for surgical treatment of her right leg wound dehiscence.  She is s/p ORIF of an ankle fracture in January.  She presented to the office last week with an open wound and exposure of the plate in the depth of the wound.  She is healed sufficiently to allow removal of the hardware.  She is on NO abx at this point.  Past Medical History  Diagnosis Date  . Hyperlipidemia     takes Atorvastatin daily  . Coronary artery disease   . Arthritis     "hands, feet, knees, shoulders" (05/05/2014)  . Bipolar disorder   . Chronic kidney disease     "dr says they aren't functioning like they should" (05/05/2014)  . 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 Type II diabetes mellitus     takes Tradjenta daily  . Depression     takes Zoloft daily  . History of bronchitis 46yrs ago  . Peripheral edema     takes Chlothalidone daily  . Joint pain   . Cataracts, bilateral     immature  . Hypertension     takes Metoprolol,Lisinopril,and Apresoline daily    Past Surgical History  Procedure Laterality Date  . I&d extremity Right 04/30/2014    Procedure: IRRIGATION AND DEBRIDEMENT EXTREMITY;  Surgeon: Wylene Simmer, MD;  Location: [facility];  Service: Orthopedics;  Laterality: Right;  . Orif ankle fracture Right 04/30/2014    Procedure: OPEN REDUCTION INTERNAL FIXATION (ORIF) ANKLE FRACTURE;  Surgeon: Wylene Simmer, MD;  Location: [facility];  Service: Orthopedics;  Laterality: Right;  . Fracture surgery    . Vaginal hysterectomy  2007  . Dilation and curettage of uterus  2005  . Squamous cell carcinoma excision Right 2012    "nose"  . Coronary artery bypass graft  05/12/2007    "CABG X1; at [facility]"    No family history on file. Social History:  reports that she has never smoked. She has never used smokeless tobacco. She reports that she does not drink alcohol or  use illicit drugs.  Allergies:  Allergies  Allergen Reactions  . Codeine Nausea And Vomiting    "Makes me deathly sick"    Medications Prior to Admission  Medication Sig Dispense Refill  . aspirin EC 325 MG tablet Take 325 mg by mouth daily.    Marland Kitchen atorvastatin (LIPITOR) 40 MG tablet Take 40 mg by mouth at bedtime.    . chlorthalidone (HYGROTON) 25 MG tablet Take 25 mg by mouth daily.   2  . hydrALAZINE (APRESOLINE) 50 MG tablet Take 50 mg by mouth 3 (three) times daily.   2  . linagliptin (TRADJENTA) 5 MG TABS tablet Take 5 mg by mouth daily.    Marland Kitchen lisinopril (PRINIVIL,ZESTRIL) 40 MG tablet Take 40 mg by mouth daily.   2  . metoprolol succinate (TOPROL-XL) 100 MG 24 hr tablet Take 100 mg by mouth daily.   2  . oxyCODONE-acetaminophen (ROXICET) 5-325 MG per tablet Take 1-2 tablets by mouth every 4 (four) hours as needed (Pain). 36 tablet 0  . sertraline (ZOLOFT) 50 MG tablet Take 50 mg by mouth daily.     . traMADol-acetaminophen (ULTRACET) 37.5-325 MG per tablet Take 1 tablet by mouth every 6 (six) hours as needed.    . enoxaparin (LOVENOX) 30 MG/0.3ML  injection Inject 0.3 mLs (30 mg total) into the skin every 12 (twelve) hours. (Patient not taking: Reported on 08/04/2014) 0 Syringe   . traMADol (ULTRAM) 50 MG tablet Take 1 tablet (50 mg total) by mouth every 6 (six) hours. (Patient not taking: Reported on 08/04/2014) 12 tablet 0    No results found for this or any previous visit (from the past 48 hour(s)). No results found.  ROS  No recent f/c/n/v/wt loss  Blood pressure 136/39, pulse 55, temperature 98 F (36.7 C), temperature source Oral, resp. rate 20, height 5' 4.5" (1.638 m), weight 88.111 kg (194 lb 4 oz), SpO2 98 %. Physical Exam  wn wd woman in nad.  A and O x 4.  Mood and affect normal.  EOMI.  resp unlabored.  R leg wound open 5 cm x 2 cm x 1 cm.  Plate is palpable at the base of the wound.  Sens to LT intact at the foot.  No lymphadenopathy.  5/5 strength in PF and DF of the  ankle and toes.  2+ dp and pt pulses.    Assessment/Plan R leg wound dehiscence with exposed hardware - to OR for removal of deep implants and I and D of the wound with possible application of a wound VAC.  The risks and benefits of the alternative treatment options have been discussed in detail.  The patient wishes to proceed with surgery and specifically understands risks of bleeding, infection, nerve damage, blood clots, need for additional surgery, amputation and death.   Wylene Simmer 2014-08-12, 8:39 AM

## 2014-08-11 NOTE — Consult Note (Signed)
[facility] for Infectious Disease     Reason for Consult: wound dehiscence    Referring Physician: Dr. Doran Durand  Active Problems:   Osteomyelitis of ankle or foot   . acetaminophen  1,000 mg Oral 4 times per day  . aspirin EC  325 mg Oral Daily  . atorvastatin  40 mg Oral QHS  . chlorthalidone  25 mg Oral Daily  . docusate sodium  100 mg Oral BID  . [START ON 08/12/2014] enoxaparin (LOVENOX) injection  30 mg Subcutaneous Q24H  . hydrALAZINE  50 mg Oral TID  . HYDROmorphone      . insulin aspart  0-15 Units Subcutaneous TID WC  . linagliptin  5 mg Oral Daily  . lisinopril  40 mg Oral Daily  . senna  1 tablet Oral BID  . sertraline  50 mg Oral Daily    Recommendations: Continue with IV antibiotics pending cultures Will wait to place picc line for now in case an organism ameniable to oral therapy grows   Assessment: She has wound dehiscence after orif and developed drainage requiring hardware removal and debridement with connection down to bone.  Will need prolonged antibiotic coverage.    Antibiotics: None prior to surgery, to start vancomycin and zosyn  HPI: [name] is a 74 y.o. female with diabetes who initially broke her ankle in [place] of this year.  She was doing well until about 3 weeks ago when she noted an area of opening.  Noted drainage coming out of it she describes as purulent with some blood.  No fever, no chills.  Went to the OR today.  Revomal of hardware.    Review of Systems: A comprehensive review of systems was negative.  Past Medical History  Diagnosis Date  . Hyperlipidemia     takes Atorvastatin daily  . Coronary artery disease   . Arthritis     "hands, feet, knees, shoulders" (05/05/2014)  . Bipolar disorder   . Chronic kidney disease     "dr says they aren't functioning like they should" (05/05/2014)  . 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 Type II diabetes mellitus     takes Tradjenta daily  . Depression       takes Zoloft daily  . History of bronchitis 42yrs ago  . Peripheral edema     takes Chlothalidone daily  . Joint pain   . Cataracts, bilateral     immature  . Hypertension     takes Metoprolol,Lisinopril,and Apresoline daily    History  Substance Use Topics  . Smoking status: Never Smoker   . Smokeless tobacco: Never Used  . Alcohol Use: No    No family history on file. Allergies  Allergen Reactions  . Codeine Nausea And Vomiting    "Makes me deathly sick"    OBJECTIVE: Blood pressure 101/45, pulse 64, temperature 98.7 F (37.1 C), temperature source Oral, resp. rate 13, height 5' 4.5" (1.638 m), weight 194 lb 4 oz (88.111 kg), SpO2 100 %. General: awake, sleepy but nad Skin: no rashes Lungs: CTA B Cor: RRR Abdomen: soft, nt, nd Ext: leg wrapped  Microbiology: Recent Results (from the past 240 hour(s))  Surgical pcr screen     Status: Abnormal   Collection Time: 08/04/14  2:15 PM  Result Value Ref Range Status   MRSA, PCR NEGATIVE NEGATIVE Final   Staphylococcus aureus POSITIVE (A) NEGATIVE Final    Comment:  The Xpert SA Assay (FDA approved for NASAL specimens in patients over 52 years of age), is one component of a comprehensive surveillance program.  Test performance has been validated by [facility] for patients greater than or equal to 76 year old. It is not intended to diagnose infection nor to guide or monitor treatment.     Scharlene Gloss, [facility] [place] [email or web address] [phone] pager  [phone] cell 08/11/2014, 3:48 PM

## 2014-08-11 NOTE — Anesthesia Procedure Notes (Signed)
Procedure Name: LMA Insertion Date/Time: 08/11/2014 9:06 AM Performed by: Trixie Deis A Pre-anesthesia Checklist: Patient identified, Emergency Drugs available, Suction available and Patient being monitored Patient Re-evaluated:Patient Re-evaluated prior to inductionOxygen Delivery Method: Circle system utilized Preoxygenation: Pre-oxygenation with 100% oxygen Intubation Type: IV induction LMA: LMA inserted LMA Size: 4.0 Number of attempts: 1 Placement Confirmation: positive ETCO2 and breath sounds checked- equal and bilateral Tube secured with: Tape Dental Injury: Teeth and Oropharynx as per pre-operative assessment

## 2014-08-11 NOTE — Consult Note (Signed)
Referring Physician:Dr. Wylene Simmer  [name] is an 74 y.o. female.                       Chief Complaint: 2nd degree AV block  HPI: 74 yo morbidly obese WF with history of DM, II, hypertension, CAD, CABG underwent right ankle lateral wound dehiscence with exposed hardware this afternoon. 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 Her Torpol XL was decreased by 50 % 3 days ago for sinus bradycardia.   Past Medical History  Diagnosis Date  . Hyperlipidemia     takes Atorvastatin daily  . Coronary artery disease   . Arthritis     "hands, feet, knees, shoulders" (05/05/2014)  . Bipolar disorder   . Chronic kidney disease     "dr says they aren't functioning like they should" (05/05/2014)  . 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 Type II diabetes mellitus     takes Tradjenta daily  . Depression     takes Zoloft daily  . History of bronchitis 40yrs ago  . Peripheral edema     takes Chlothalidone daily  . Joint pain   . Cataracts, bilateral     immature  . Hypertension     takes Metoprolol,Lisinopril,and Apresoline daily      Past Surgical History  Procedure Laterality Date  . I&d extremity Right 04/30/2014    Procedure: IRRIGATION AND DEBRIDEMENT EXTREMITY;  Surgeon: Wylene Simmer, MD;  Location: [facility];  Service: Orthopedics;  Laterality: Right;  . Orif ankle fracture Right 04/30/2014    Procedure: OPEN REDUCTION INTERNAL FIXATION (ORIF) ANKLE FRACTURE;  Surgeon: Wylene Simmer, MD;  Location: [facility];  Service: Orthopedics;  Laterality: Right;  . Fracture surgery    . Vaginal hysterectomy  2007  . Dilation and curettage of uterus  2005  . Squamous cell carcinoma excision Right 2012    "nose"  . Coronary artery bypass graft  05/12/2007    "CABG X1; at [facility]"    No family history on file. Social History:  reports that she has never smoked. She has never used smokeless tobacco. She reports that she does not drink alcohol or use illicit  drugs.  Allergies:  Allergies  Allergen Reactions  . Codeine Nausea And Vomiting    "Makes me deathly sick"    Medications Prior to Admission  Medication Sig Dispense Refill  . aspirin EC 325 MG tablet Take 325 mg by mouth daily.    Marland Kitchen atorvastatin (LIPITOR) 40 MG tablet Take 40 mg by mouth at bedtime.    . chlorthalidone (HYGROTON) 25 MG tablet Take 25 mg by mouth daily.   2  . hydrALAZINE (APRESOLINE) 50 MG tablet Take 50 mg by mouth 3 (three) times daily.   2  . linagliptin (TRADJENTA) 5 MG TABS tablet Take 5 mg by mouth daily.    Marland Kitchen lisinopril (PRINIVIL,ZESTRIL) 40 MG tablet Take 40 mg by mouth daily.   2  . metoprolol succinate (TOPROL-XL) 100 MG 24 hr tablet Take 100 mg by mouth daily.   2  . oxyCODONE-acetaminophen (ROXICET) 5-325 MG per tablet Take 1-2 tablets by mouth every 4 (four) hours as needed (Pain). 36 tablet 0  . sertraline (ZOLOFT) 50 MG tablet Take 50 mg by mouth daily.     . traMADol-acetaminophen (ULTRACET) 37.5-325 MG per tablet Take 1 tablet by mouth every 6 (six) hours as needed.    Marland Kitchen  enoxaparin (LOVENOX) 30 MG/0.3ML injection Inject 0.3 mLs (30 mg total) into the skin every 12 (twelve) hours. (Patient not taking: Reported on 08/04/2014) 0 Syringe   . traMADol (ULTRAM) 50 MG tablet Take 1 tablet (50 mg total) by mouth every 6 (six) hours. (Patient not taking: Reported on 08/04/2014) 12 tablet 0    Results for orders placed or performed during the hospital encounter of 08/11/14 (from the past 48 hour(s))  Glucose, capillary     Status: Abnormal   Collection Time: 08/11/14  8:10 AM  Result Value Ref Range   Glucose-Capillary 130 (H) 70 - 99 mg/dL  Glucose, capillary     Status: Abnormal   Collection Time: 08/11/14 11:18 AM  Result Value Ref Range   Glucose-Capillary 134 (H) 70 - 99 mg/dL   Comment 1 Notify RN    Comment 2 Document in Chart   Glucose, capillary     Status: None   Collection Time: 08/11/14  3:46 PM  Result Value Ref Range   Glucose-Capillary 87  70 - 99 mg/dL   Comment 1 Notify RN    No results found.  Review Of Systems Constitutional: No Weight Loss, No Weight Gain, Night Sweats, Fevers, Chills, Dizziness, Fatigue, or Generalized Weakness HEENT: No Headaches, Difficulty Swallowing,Tooth/Dental Problems,Sore Throat,  No Sneezing, Rhinitis, Ear Ache, Nasal Congestion, or Post Nasal Drip,  Cardio-vascular: +ve Chest pain, No Orthopnea, PND, Edema in Lower Extremities, Anasarca, Dizziness, Palpitations  Resp: No Dyspnea, No DOE, No Productive Cough, No Non-Productive Cough, No Hemoptysis, No Wheezing.  GI: No Heartburn, Indigestion, Abdominal Pain, Nausea, Vomiting, Diarrhea, Hematemesis, Hematochezia, Melena, Change in Bowel Habits, Loss of Appetite  GU: No Dysuria, Change in Color of Urine, No Urgency or Frequency, No Flank pain.  Musculoskeletal: No Joint Pain or Swelling, No Decreased Range of Motion, No Back Pain.  Neurologic: +ve Syncope, No Seizures, Muscle Weakness, Paresthesia, Vision Disturbance or Loss, No Diplopia, No Vertigo, No Difficulty Walking,  Skin: No Rash or Lesions. Psych: No Change in Mood or Affect, No Depression or Anxiety, No Memory loss, No Confusion, or Hallucinations  Blood pressure 101/45, pulse 64, temperature 98.7 F (37.1 C), temperature source Oral, resp. rate 13, height 5' 4.5" (1.638 m), weight 88.111 kg (194 lb 4 oz), SpO2 100 %. General: She is short and well nourished; Appears anxious.  HEENT: Normocephalic and Atraumatic PERRLA; EOM intact; No scleral icterus, Nares: Patent, Oropharynx: Clear.  Neck: Full ROM, No Cervical Lymphadenopathy nor Thyromegaly or Carotid Bruit; No JVD; CHEST: Normal respiration, clear to auscultation bilaterally HEART: Irregular rate and rhythm; no murmurs rubs or gallops BACK: No kyphosis or scoliosis; No CVA tenderness ABDOMEN: Positive Bowel Sounds, Obese, Soft, Non-Tender; No Masses, No Organomegaly. EXTREMITIES: RLE with heavy dressing. No  Cyanosis, Clubbing, or Edema. PULSES: 2+ and symmetric SKIN: Normal hydration, no rash or ulceration. CNS: Alert and Oriented x 3, No Focal Deficits, Limited Movement due to Pain and Immobilized RLE.   Assessment/Plan 2nd. Degree type I, AV block Right ankle lateral wound dehiscence with exposed hardware debridement S/P CAD CABG Right ankle fracture with ORIF S/P Multiple right ribs fractures DM, II Hypertension Hyperlipidemia  Hold B-blocker for now.   Birdie Riddle, MD  08/11/2014, 6:54 PM

## 2014-08-11 NOTE — Anesthesia Preprocedure Evaluation (Signed)
Anesthesia Evaluation  Patient identified by MRN, date of birth, ID band Patient awake    Reviewed: Allergy & Precautions, NPO status , Patient's Chart, lab work & pertinent test results  Airway Mallampati: II   Neck ROM: full    Dental   Pulmonary neg pulmonary ROS,  breath sounds clear to auscultation        Cardiovascular hypertension, + CAD Rhythm:regular Rate:Normal     Neuro/Psych Depression Bipolar Disorder    GI/Hepatic   Endo/Other  diabetes, Type 2obese  Renal/GU Renal InsufficiencyRenal disease     Musculoskeletal  (+) Arthritis -,   Abdominal   Peds  Hematology   Anesthesia Other Findings   Reproductive/Obstetrics                             Anesthesia Physical Anesthesia Plan  ASA: III  Anesthesia Plan: General   Post-op Pain Management:    Induction: Intravenous  Airway Management Planned: LMA  Additional Equipment:   Intra-op Plan:   Post-operative Plan:   Informed Consent: I have reviewed the patients History and Physical, chart, labs and discussed the procedure including the risks, benefits and alternatives for the proposed anesthesia with the patient or authorized representative who has indicated his/her understanding and acceptance.     Plan Discussed with: CRNA, Anesthesiologist and Surgeon  Anesthesia Plan Comments:         Anesthesia Quick Evaluation

## 2014-08-11 NOTE — Progress Notes (Signed)
Utilization review completed.  

## 2014-08-11 NOTE — Anesthesia Postprocedure Evaluation (Signed)
  Anesthesia Post-op Note  Patient: [name]  Procedure(s) Performed: Procedure(s): REMOVAL OF DEEP IMPLANTS OF RIGHT TIBIAL FIBIAL INCISION AND DRAINAGE OF RIGHT ANKLE WOUND (Right) REMOVAL OF DEEP IMPLANTS OF RIGHT TIBIAL FIBIAL INCISION AND DRAINAGE OF RIGHT ANKLE WOUND (Right)  Patient Location: PACU  Anesthesia Type: General   Level of Consciousness: awake, alert  and oriented  Airway and Oxygen Therapy: Patient Spontanous Breathing  Post-op Pain: mild  Post-op Assessment: Post-op Vital signs reviewed  Post-op Vital Signs: Reviewed  Last Vitals:  Filed Vitals:   08/11/14 1445  BP: 101/45  Pulse: 64  Temp: 37.1 C  Resp: 13    Complications: No apparent anesthesia complications

## 2014-08-11 NOTE — Progress Notes (Signed)
ANTIBIOTIC CONSULT NOTE - INITIAL  Pharmacy Consult for vancomycin, Zosyn Indication: R-leg open wound with possible osteomyelitis  Allergies  Allergen Reactions  . Codeine Nausea And Vomiting    "Makes me deathly sick"  Patient Measurements: Height: 5' 4.5" (163.8 cm) Weight: 194 lb 4 oz (88.111 kg) IBW/kg (Calculated) : 55.85 Vital Signs: Temp: 98.7 F (37.1 C) (04/28 1445) Temp Source: Oral (04/28 0816) BP: 101/45 mmHg (04/28 1445) Pulse Rate: 64 (04/28 1445) Intake/Output from this shift: Total I/O In: 1320 [P.O.:220; I.V.:1100] Out: 270 [Urine:250; Blood:20]  Labs: No results for input(s): WBC, HGB, PLT, LABCREA, CREATININE in the last 72 hours. Estimated Creatinine Clearance: 28.4 mL/min (by C-G formula based on Cr of 1.89). No results for input(s): VANCOTROUGH, VANCOPEAK, VANCORANDOM, GENTTROUGH, GENTPEAK, GENTRANDOM, TOBRATROUGH, TOBRAPEAK, TOBRARND, AMIKACINPEAK, AMIKACINTROU, AMIKACIN in the last 72 hours.   Microbiology: Recent Results (from the past 720 hour(s))  Surgical pcr screen     Status: Abnormal   Collection Time: 08/04/14  2:15 PM  Result Value Ref Range Status   MRSA, PCR NEGATIVE NEGATIVE Final   Staphylococcus aureus POSITIVE (A) NEGATIVE Final    Comment:        The Xpert SA Assay (FDA approved for NASAL specimens in patients over 48 years of age), is one component of a comprehensive surveillance program.  Test performance has been validated by [facility] for patients greater than or equal to 45 year old. It is not intended to diagnose infection nor to guide or monitor treatment.     Medical History: Past Medical History  Diagnosis Date  . Hyperlipidemia     takes Atorvastatin daily  . Coronary artery disease   . Arthritis     "hands, feet, knees, shoulders" (05/05/2014)  . Bipolar disorder   . Chronic kidney disease     "dr says they aren't functioning like they should" (05/05/2014)  . 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 Type II diabetes mellitus     takes Tradjenta daily  . Depression     takes Zoloft daily  . History of bronchitis 79yrs ago  . Peripheral edema     takes Chlothalidone daily  . Joint pain   . Cataracts, bilateral     immature  . Hypertension     takes Metoprolol,Lisinopril,and Apresoline daily    Medications:  Anti-infectives    None     Assessment: 74 year old female with diabetes and right leg wound dehiscence after an ORIF for ankle fracture in January now s/p I&D and removal of deep implants this AM to start antibiotics for concern of possible osteomyelitis and infection. Currently, patient is afebrile and last CBC from 4/21 was within normal limits. No CBC available from today. Cultures were obtained with I&D and are pending. Last SCr from 4/21 was 1.89 with estimated CrCl~20-49mL/min. Patient received 1000mg  of vancomycin at 0932 AM pre-operatively.   Goal of Therapy:  Vancomycin trough level 15-20 mcg/ml  Plan:  Vancomycin extra 500mg  IV x1 today (total 1500mg ) then 1000mg  IV every 24 hours.  Zosyn 3.375g IV x1 now over 30 minutes, then 3.375g IV every 8 hours - each dose over 4 hours.  Monitor renal function closely and patient toleration of 4 hr infusion.   Sloan Leiter, PharmD, BCPS Clinical Pharmacist [phone] 08/11/2014,3:36 PM

## 2014-08-11 NOTE — Transfer of Care (Signed)
Immediate Anesthesia Transfer of Care Note  Patient: [name]  Procedure(s) Performed: Procedure(s): REMOVAL OF DEEP IMPLANTS OF RIGHT TIBIAL FIBIAL INCISION AND DRAINAGE OF RIGHT ANKLE WOUND (Right) REMOVAL OF DEEP IMPLANTS OF RIGHT TIBIAL FIBIAL INCISION AND DRAINAGE OF RIGHT ANKLE WOUND (Right)  Patient Location: PACU  Anesthesia Type:General  Level of Consciousness: awake and alert   Airway & Oxygen Therapy: Patient Spontanous Breathing and Patient connected to nasal cannula oxygen  Post-op Assessment: Report given to RN and Post -op Vital signs reviewed and stable  Post vital signs: Reviewed and stable  Last Vitals:  Filed Vitals:   08/11/14 1030  BP:   Pulse: 66  Temp:   Resp: 14    Complications: No apparent anesthesia complications

## 2014-08-12 ENCOUNTER — Encounter (HOSPITAL_COMMUNITY): Payer: Self-pay | Admitting: Orthopedic Surgery

## 2014-08-12 DIAGNOSIS — M869 Osteomyelitis, unspecified: Secondary | ICD-10-CM

## 2014-08-12 LAB — GLUCOSE, CAPILLARY
GLUCOSE-CAPILLARY: 142 mg/dL — AB (ref 70–99)
GLUCOSE-CAPILLARY: 140 mg/dL — AB (ref 70–99)
GLUCOSE-CAPILLARY: 114 mg/dL — AB (ref 70–99)
Glucose-Capillary: 116 mg/dL — ABNORMAL HIGH (ref 70–99)

## 2014-08-12 LAB — SEDIMENTATION RATE: SED RATE: 42 mm/h — AB (ref 0–22)

## 2014-08-12 LAB — C-REACTIVE PROTEIN: CRP: 2.5 mg/dL — AB (ref <0.60)

## 2014-08-12 MED ORDER — VANCOMYCIN HCL 10 G IV SOLR
1250.0000 mg | INTRAVENOUS | Status: DC
  Administered 2014-08-12: 1250 mg via INTRAVENOUS
  Filled 2014-08-12 (×2): qty 1250

## 2014-08-12 MED ORDER — VANCOMYCIN HCL 10 G IV SOLR
1250.0000 mg | INTRAVENOUS | Status: DC
  Administered 2014-08-13 – 2014-08-14 (×2): 1250 mg via INTRAVENOUS
  Filled 2014-08-12 (×3): qty 1250

## 2014-08-12 NOTE — Progress Notes (Signed)
Subjective: 1 Day Post-Op Procedure(s) (LRB): REMOVAL OF DEEP IMPLANTS OF RIGHT TIBIAL FIBIAL INCISION AND DRAINAGE OF RIGHT ANKLE WOUND (Right) REMOVAL OF DEEP IMPLANTS OF RIGHT TIBIAL FIBIAL INCISION AND DRAINAGE OF RIGHT ANKLE WOUND (Right) Patient reports pain as mild.  No CP or SOB.  Beta blocker stopped.  Cards consult reviewed.  Objective: Vital signs in last 24 hours: Temp:  [98 F (36.7 C)-99.1 F (37.3 C)] 98.5 F (36.9 C) (04/29 0541) Pulse Rate:  [50-88] 64 (04/29 0541) Resp:  [10-20] 14 (04/29 0541) BP: (101-163)/(39-66) 132/52 mmHg (04/29 0541) SpO2:  [93 %-100 %] 98 % (04/29 0541) Weight:  [88.111 kg (194 lb 4 oz)] 88.111 kg (194 lb 4 oz) (04/28 0816)  Intake/Output from previous day: 04/28 0701 - 04/29 0700 In: 2020 [P.O.:270; I.V.:1600; IV Piggyback:150] Out: 270 [Urine:250; Blood:20] Intake/Output this shift:     Recent Labs  08/11/14 1858  HGB 10.6*    Recent Labs  08/11/14 1858  WBC 8.6  RBC 4.00  HCT 33.2*  PLT 179    Recent Labs  08/11/14 1858  CREATININE 1.60*   No results for input(s): LABPT, INR in the last 72 hours.  wn wd woman innad.  R LE splinted.  NV exam at toes unchanged.  Assessment/Plan: 1 Day Post-Op Procedure(s) (LRB): REMOVAL OF DEEP IMPLANTS OF RIGHT TIBIAL FIBIAL INCISION AND DRAINAGE OF RIGHT ANKLE WOUND (Right) REMOVAL OF DEEP IMPLANTS OF RIGHT TIBIAL FIBIAL INCISION AND DRAINAGE OF RIGHT ANKLE WOUND (Right) Up with therapy  Continue vanc anc zosyn.  ID and cards recs appreciated.  Wylene Simmer 08/12/2014, 7:50 AM

## 2014-08-12 NOTE — Progress Notes (Signed)
Orthopedic Tech Progress Note Patient Details:  [name] [birth year] [id number] Adjusted proximal portion of posterior splint for pt. Comfort. Ortho Devices Type of Ortho Device: Other (comment) Ortho Device/Splint Location: RLE Ortho Device/Splint Interventions: Adjustment   Darrol Poke 08/12/2014, 11:34 PM

## 2014-08-12 NOTE — Op Note (Signed)
NAMEKRYSTEL, [name]              ACCOUNT NO.:  [id number]  MEDICAL RECORD NO.:  [id number]  LOCATION:  [id number]                        FACILITY:  [facility]  PHYSICIAN:  Wylene Simmer, MD        DATE OF BIRTH:  [birth year]  DATE OF PROCEDURE:  08/11/2014 DATE OF DISCHARGE:                              OPERATIVE REPORT   PREOPERATIVE DIAGNOSIS:  Right ankle lateral wound dehiscence with exposed hardware.  POSTOPERATIVE DIAGNOSIS:  Right ankle lateral wound dehiscence with exposed hardware.  PROCEDURES: 1. Removal of deep implants from the right fibula. 2. Removal of deep implants from the right tibia through separate     incision. 3. Irrigation and excisional debridement of the right lateral ankle     wound including skin, subcutaneous tissue, muscle, and bone.  SURGEON:  Wylene Simmer, MD  ANESTHESIA:  General.  ESTIMATED BLOOD LOSS:  Minimal.  TOURNIQUET TIME:  39 minutes at 250 mmHg.  COMPLICATIONS:  None apparent.  DISPOSITION:  Extubated, awake, and stable to recovery.  SPECIMENS:  Deep tissue to Microbiology for aerobic and anaerobic culture.  INDICATIONS FOR PROCEDURE:  The patient is a 74 year old woman with past medical history significant for diabetes.  She underwent open reduction and internal fixation of her right ankle bimalleolar fracture back in January.  She was generally doing well after this injury until she experienced breakdown of the lateral wound.  When she presented to the clinic, the wound was open and measured 3 cm x 2 cm x 1 cm and the plate was exposed at the base of the wound.  She presents now for irrigation and debridement of this wound and possible removal of the hardware and possible placement of wound VAC.  She is not currently taking any antibiotics.  She understands the risks, benefits, the alternative treatment options, and elects surgical treatment.  She specifically understands risks of bleeding, infection, nerve damage, blood clots, need  for additional surgery, continued pain, amputation, and death.  PROCEDURE IN DETAIL:  After preoperative consent was obtained and the correct operative site was identified, the patient was brought to the operating room and placed supine on the operating table.  General anesthesia was induced.  Preoperative antibiotics were held for intraoperative cultures.  Surgical time-out was taken.  Right lower extremity was then prepped and draped in standard sterile fashion with tourniquet around the thigh.  The extremity was exsanguinated.  The tourniquet was inflated to 250 mmHg.  The patient's lateral wound was identified.  There was abundant fibrinous exudate and necrotic tissue. This was all sharply excised.  The wound then measured about 3 cm long by 2 cm wide and was about a cm deep with the plate exposed at the base of the wound.  There was mucinous material anterior and posterior to the plate deep.  This was all collected with a rongeur and curette and sent as a specimen to Microbiology for aerobic and anaerobic culture.  Once the cultures were obtained, a g of IV vancomycin was administered.  The fracture site was carefully inspected.  There was callus formation noted, but this did not appear to be healed.  Given the extensive soft tissue loss and appearance of  infection at the deep level of plate, the decision was made to remove the hardware in an effort to heal the wound.  The wound was extended sharply proximally and distally.  The lateral plate was cleared of all the soft tissue.  It was removed in its entirety along with all of the screws.  Excisional debridement was then carried out from the level of skin down through the subcutaneous tissue, muscle, and bone.  This was all debrided sharply with curettes, rongeurs, and a scalpel.  The wound was then irrigated copiously with 3 L of normal saline.  Repeat debridement was then performed again sharply with curettes and rongeurs.  The  wound was again irrigated.  Soft tissues were mobilized anteriorly and posteriorly.  2-0 nylon sutures were then used to approximate the wound edges.  Attention was then turned to the medial malleolus.  A guide pin was inserted through the soft tissue at the tip of the medial malleolus. The anterior screw was identified.  An incision was made and the screw was cleaned of all soft tissue.  The cannulated screwdriver was used to remove the screw in its entirety.  The guide pin was then repositioned into the posterior screw.  Again, the head was cleared of all soft tissue and the screwdriver was used to remove the screw in its entirety. The wound was irrigated copiously and closed with nylon simple suture. Sterile dressings were applied followed by well-padded short leg splint. Tourniquet was released at 39 minutes.  The patient was awakened from anesthesia and transported to the recovery room in stable condition.  FOLLOWUP PLAN:  The patient will be nonweightbearing on the right lower extremity.  She will be admitted for IV vancomycin and Zosyn.  She will have Infectious Disease consultation for management of this deep infection.     Wylene Simmer, MD     JH/MEDQ  D:  08/11/2014  T:  08/12/2014  Job:  [id number]

## 2014-08-12 NOTE — Care Management Note (Signed)
CARE MANAGEMENT NOTE 08/12/2014  Patient:  [name], [name]   Account Number:  [id number]  Date Initiated:  08/12/2014  Documentation initiated by:  Ricki Miller  Subjective/Objective Assessment:   74 yr old female admitted with right ankle wound dehiesced, patient underwent removal of hardware from right ankle and revision.     Action/Plan:   Case manager spoke with patient concerning home health and DME. Patient states she was with [facility]. CM contacted [facility] with referral. Patient states she has WC, RW and 3in1.   Anticipated DC Date:     Anticipated DC Plan:  [place]  CM consult      [facility] Choice  HOME HEALTH   Choice offered to / List presented to:  C-1 Patient   DME arranged  NA        [place] arranged  HH-2 PT      Status of service:  In process, will continue to follow Medicare Important Message given?   (If response is "NO", the following Medicare IM given date fields will be blank) Date Medicare IM given:   Medicare IM given by:   Date Additional Medicare IM given:   Additional Medicare IM given by:    Discharge Disposition:    Per UR Regulation:    If discussed at Long Length of Stay Meetings, dates discussed:    Comments:  08/12/14 [place], RN BSN Case Manager [phone] Case Manager will continue to monitor for possible home IV antibiotics.[facility] is agency of patient's choice and they will also arrange for IV meds if needed.

## 2014-08-12 NOTE — Progress Notes (Signed)
ANTIBIOTIC CONSULT NOTE - FOLLOW UP  Pharmacy Consult for vancomycin, Zosyn Indication: R-leg open wound with possible osteomyelitis  Allergies  Allergen Reactions  . Codeine Nausea And Vomiting    "Makes me deathly sick"  Patient Measurements: Height: 5' 4.5" (163.8 cm) Weight: 194 lb 4 oz (88.111 kg) IBW/kg (Calculated) : 55.85 Vital Signs: Temp: 98.5 F (36.9 C) (04/29 0541) BP: 132/52 mmHg (04/29 0541) Pulse Rate: 64 (04/29 0541) Intake/Output from this shift: Total I/O In: 120 [P.O.:120] Out: -   Labs:  Recent Labs  08/11/14 1858  WBC 8.6  HGB 10.6*  PLT 179  CREATININE 1.60*   Estimated Creatinine Clearance: 33.5 mL/min (by C-G formula based on Cr of 1.6). No results for input(s): VANCOTROUGH, VANCOPEAK, VANCORANDOM, GENTTROUGH, GENTPEAK, GENTRANDOM, TOBRATROUGH, TOBRAPEAK, TOBRARND, AMIKACINPEAK, AMIKACINTROU, AMIKACIN in the last 72 hours.   Microbiology: Recent Results (from the past 720 hour(s))  Surgical pcr screen     Status: Abnormal   Collection Time: 08/04/14  2:15 PM  Result Value Ref Range Status   MRSA, PCR NEGATIVE NEGATIVE Final   Staphylococcus aureus POSITIVE (A) NEGATIVE Final    Comment:        The Xpert SA Assay (FDA approved for NASAL specimens in patients over 31 years of age), is one component of a comprehensive surveillance program.  Test performance has been validated by [facility] for patients greater than or equal to 75 year old. It is not intended to diagnose infection nor to guide or monitor treatment.   Tissue culture     Status: None (Preliminary result)   Collection Time: 08/11/14  9:25 AM  Result Value Ref Range Status   Specimen Description TISSUE RIGHT FOOT  Final   Special Requests NONE  Final   Gram Stain   Final    FEW WBC PRESENT,BOTH PMN AND MONONUCLEAR NO ORGANISMS SEEN Performed at [facility]    Culture PENDING  Incomplete   Report Status PENDING  Incomplete    Medical History: Past  Medical History  Diagnosis Date  . Hyperlipidemia     takes Atorvastatin daily  . Coronary artery disease   . Arthritis     "hands, feet, knees, shoulders" (05/05/2014)  . Bipolar disorder   . Chronic kidney disease     "dr says they aren't functioning like they should" (05/05/2014)  . 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 Type II diabetes mellitus     takes Tradjenta daily  . Depression     takes Zoloft daily  . History of bronchitis 25yrs ago  . Peripheral edema     takes Chlothalidone daily  . Joint pain   . Cataracts, bilateral     immature  . Hypertension     takes Metoprolol,Lisinopril,and Apresoline daily    Medications:  Anti-infectives    Start     Dose/Rate Route Frequency Ordered Stop   08/12/14 1200  vancomycin (VANCOCIN) IVPB 1000 mg/200 mL premix  Status:  Discontinued     1,000 mg 200 mL/hr over 60 Minutes Intravenous Every 24 hours 08/11/14 1550 08/12/14 1122   08/12/14 1200  vancomycin (VANCOCIN) 1,250 mg in sodium chloride 0.9 % 250 mL IVPB     1,250 mg 166.7 mL/hr over 90 Minutes Intravenous Every 24 hours 08/12/14 1122     08/12/14 0000  piperacillin-tazobactam (ZOSYN) IVPB 3.375 g     3.375 g 12.5 mL/hr over 240 Minutes Intravenous Every 8 hours 08/11/14 1550  08/11/14 1600  vancomycin (VANCOCIN) 500 mg in sodium chloride 0.9 % 100 mL IVPB     500 mg 100 mL/hr over 60 Minutes Intravenous  Once 08/11/14 1550 08/11/14 2200   08/11/14 1600  piperacillin-tazobactam (ZOSYN) IVPB 3.375 g     3.375 g 100 mL/hr over 30 Minutes Intravenous  Once 08/11/14 1550       Assessment: 74 year old female with diabetes and right leg wound dehiscence after an ORIF for ankle fracture in January now s/p I&D and removal of deep implants on 4/28 started on antibiotics for concern of possible osteomyelitis and infection. Currently, patient is afebrile and WBC is wnl. Cultures were obtained with I&D and are pending. SCr has improved to 1.6.   Vanc 4/28  >> Zosyn 4/28 >>  4/28 Anaerobic cx >> 4/28 Fungus cx >> 4/28 Tissue cx >>  Goal of Therapy:  Vancomycin trough level 15-20 mcg/ml  Plan:  Increase Vancomycin to 1250 mg IV Q 24 hours  Continue Zosyn 3.375g IV every 8 hours - each dose over 4 hours.  Monitor renal function closely and patient toleration of 4 hr infusion.   Albertina Parr, PharmD., BCPS Clinical Pharmacist Pager [phone]

## 2014-08-12 NOTE — Evaluation (Signed)
Physical Therapy Evaluation Patient Details Name: [name] MRN: [id number] DOB: 04/15/1941 Today's Date: 08/12/2014   History of Present Illness  Pt admitted for removal of R ankle hardware and I & D. PMH includes DM, HTN, CABG, R ankle fx Jan 2016.  Clinical Impression  Pt admitted with above diagnosis. Pt currently with functional limitations due to the deficits listed below (see PT Problem List). Pt will benefit from skilled PT to increase their independence and safety with mobility to allow discharge to the venue listed below.   Pt has all DME from previous admission and when she was NWB at that time.  Pt plans on using wheelchair for longer distances.     Follow Up Recommendations Home health PT    Equipment Recommendations  None recommended by PT    Recommendations for Other Services       Precautions / Restrictions Precautions Precautions: Fall Restrictions RLE Weight Bearing: Non weight bearing      Mobility  Bed Mobility Overal bed mobility: Needs Assistance Bed Mobility: Supine to Sit     Supine to sit: Supervision;HOB elevated     General bed mobility comments: HOB elevated and use of rail.  Transfers Overall transfer level: Needs assistance Equipment used: Rolling walker (2 wheeled) Transfers: Sit to/from Stand Sit to Stand: Supervision;Min guard         General transfer comment: good use of hands with sit >stand, but poor technique with stand > sit.  Ambulation/Gait Ambulation/Gait assistance: Min guard Ambulation Distance (Feet): 5 Feet Assistive device: Rolling walker (2 wheeled) Gait Pattern/deviations: Step-to pattern     General Gait Details: Pt able to hop 5' with RW and maintain NWB status.  Poor endurance.    Stairs            Wheelchair Mobility    Modified Rankin (Stroke Patients Only)       Balance Overall balance assessment: Needs assistance         Standing balance support: Bilateral upper extremity  supported Standing balance-Leahy Scale: Poor Standing balance comment: due to R LE NWB                             Pertinent Vitals/Pain Pain Assessment: No/denies pain Pain Intervention(s): Premedicated before session    Home Living Family/patient expects to be discharged to:: Private residence Living Arrangements: Children Available Help at Discharge: Family;Available 24 hours/day Type of Home: House Home Access: Ramped entrance     Home Layout: Multi-level;Able to live on main level with bedroom/bathroom Home Equipment: Gilford Rile - 2 wheels;Bedside commode;Shower seat;Wheelchair - manual Additional Comments: Son in power w/c due to amputations, but she states other family can help and are avilable 24/7    Prior Function Level of Independence: Independent with assistive device(s)         Comments: Has been using RW since inital fracture in January.     Hand Dominance   Dominant Hand: Right    Extremity/Trunk Assessment               Lower Extremity Assessment: RLE deficits/detail;Overall WFL for tasks assessed RLE Deficits / Details: Able to wiggle toes    Cervical / Trunk Assessment: Normal  Communication   Communication: HOH  Cognition Arousal/Alertness: Awake/alert Behavior During Therapy: WFL for tasks assessed/performed Overall Cognitive Status: Within Functional Limits for tasks assessed  General Comments General comments (skin integrity, edema, etc.): Pt is pleasant and cooperative.    Exercises        Assessment/Plan    PT Assessment Patient needs continued PT services  PT Diagnosis Difficulty walking   PT Problem List Decreased strength;Decreased activity tolerance;Decreased balance;Decreased mobility;Decreased knowledge of use of DME;Decreased knowledge of precautions  PT Treatment Interventions DME instruction;Gait training;Functional mobility training;Therapeutic activities;Therapeutic  exercise;Balance training   PT Goals (Current goals can be found in the Care Plan section) Acute Rehab PT Goals Patient Stated Goal: get OOB and return home. PT Goal Formulation: With patient Time For Goal Achievement: 08/19/14 Potential to Achieve Goals: Good    Frequency Min 5X/week   Barriers to discharge        Co-evaluation               End of Session Equipment Utilized During Treatment: Gait belt Activity Tolerance: Patient tolerated treatment well Patient left: in chair;with call bell/phone within reach Nurse Communication: Mobility status;Weight bearing status         Time: 1020-1035 PT Time Calculation (min) (ACUTE ONLY): 15 min   Charges:   PT Evaluation $Initial PT Evaluation Tier I: 1 Procedure     PT G Codes:        [name] 08/12/2014, 11:12 AM

## 2014-08-12 NOTE — Progress Notes (Signed)
    [facility] for Infectious Disease  Date of Admission:  08/11/2014  Antibiotics: Vancomycin and zosyn  Subjective: No complaints  Objective: Temp:  [98.4 F (36.9 C)-99.1 F (37.3 C)] 98.4 F (36.9 C) (04/29 1300) Pulse Rate:  [51-66] 51 (04/29 1300) Resp:  [13-16] 16 (04/29 1300) BP: (100-132)/(32-52) 100/32 mmHg (04/29 1300) SpO2:  [98 %-100 %] 98 % (04/29 1300)  General: Awake, alert, nad Skin: no rashes Lungs: CTA B Cor: RRR Abdomen: soft, nt, nd Ext: right leg wrapped  Lab Results Lab Results  Component Value Date   WBC 8.6 08/11/2014   HGB 10.6* 08/11/2014   HCT 33.2* 08/11/2014   MCV 83.0 08/11/2014   PLT 179 08/11/2014    Lab Results  Component Value Date   CREATININE 1.60* 08/11/2014   BUN 30* 08/04/2014   NA 140 08/04/2014   K 4.2 08/04/2014   CL 108 08/04/2014   CO2 21 08/04/2014    Lab Results  Component Value Date   ALT 17 05/01/2014   AST 34 05/01/2014   ALKPHOS 72 05/01/2014   BILITOT 0.9 05/01/2014      Microbiology: Recent Results (from the past 240 hour(s))  Surgical pcr screen     Status: Abnormal   Collection Time: 08/04/14  2:15 PM  Result Value Ref Range Status   MRSA, PCR NEGATIVE NEGATIVE Final   Staphylococcus aureus POSITIVE (A) NEGATIVE Final    Comment:        The Xpert SA Assay (FDA approved for NASAL specimens in patients over 37 years of age), is one component of a comprehensive surveillance program.  Test performance has been validated by [facility] for patients greater than or equal to 38 year old. It is not intended to diagnose infection nor to guide or monitor treatment.   Tissue culture     Status: None (Preliminary result)   Collection Time: 08/11/14  9:25 AM  Result Value Ref Range Status   Specimen Description TISSUE RIGHT FOOT  Final   Special Requests NONE  Final   Gram Stain   Final    FEW WBC PRESENT,BOTH PMN AND MONONUCLEAR NO ORGANISMS SEEN Performed at [facility]    Culture PENDING  Incomplete   Report Status PENDING  Incomplete    Studies/Results: No results found.  Assessment/Plan:  1) osteomyelitis s/p wound dehiscence and hardware removal - on broad spectrum antibiotics, pending cultures, done off of antibiotics.   Will continue with the same. Dr. Baxter Flattery will follow cultures over the weekend If she goes home over the weekend, would use IV vancomycin and oral levaquin 750 mg daily unless culture grows out something  Scharlene Gloss, [place] for Infectious Disease [place] [email or web address] [phone] pager   [phone] cell 08/12/2014, 2:21 PM

## 2014-08-13 DIAGNOSIS — N183 Chronic kidney disease, stage 3 (moderate): Secondary | ICD-10-CM

## 2014-08-13 DIAGNOSIS — M79604 Pain in right leg: Secondary | ICD-10-CM

## 2014-08-13 LAB — GLUCOSE, CAPILLARY
GLUCOSE-CAPILLARY: 116 mg/dL — AB (ref 70–99)
GLUCOSE-CAPILLARY: 88 mg/dL (ref 70–99)
GLUCOSE-CAPILLARY: 88 mg/dL (ref 70–99)
Glucose-Capillary: 89 mg/dL (ref 70–99)

## 2014-08-13 MED ORDER — CEFAZOLIN SODIUM-DEXTROSE 2-3 GM-% IV SOLR
2.0000 g | Freq: Two times a day (BID) | INTRAVENOUS | Status: DC
  Administered 2014-08-13 – 2014-08-14 (×3): 2 g via INTRAVENOUS
  Filled 2014-08-13 (×5): qty 50

## 2014-08-13 MED ORDER — SODIUM CHLORIDE 0.9 % IJ SOLN
10.0000 mL | INTRAMUSCULAR | Status: DC | PRN
  Administered 2014-08-14 – 2014-08-15 (×3): 10 mL
  Filled 2014-08-13 (×3): qty 40

## 2014-08-13 NOTE — Progress Notes (Signed)
ANTIBIOTIC CONSULT NOTE   Pharmacy Consult for cefazolin Indication: R-leg open wound with possible osteomyelitis  Allergies  Allergen Reactions  . Codeine Nausea And Vomiting    "Makes me deathly sick"  Patient Measurements: Height: 5' 4.5" (163.8 cm) Weight: 194 lb 4 oz (88.111 kg) IBW/kg (Calculated) : 55.85 Vital Signs: Temp: 98.7 F (37.1 C) (04/30 0446) Temp Source: Oral (04/30 0446) BP: 119/35 mmHg (04/30 0446) Pulse Rate: 60 (04/30 0446) Intake/Output from this shift: Total I/O In: 300 [P.O.:300] Out: -   Labs:  Recent Labs  08/11/14 1858  WBC 8.6  HGB 10.6*  PLT 179  CREATININE 1.60*   Estimated Creatinine Clearance: 33.5 mL/min (by C-G formula based on Cr of 1.6). No results for input(s): VANCOTROUGH, VANCOPEAK, VANCORANDOM, GENTTROUGH, GENTPEAK, GENTRANDOM, TOBRATROUGH, TOBRAPEAK, TOBRARND, AMIKACINPEAK, AMIKACINTROU, AMIKACIN in the last 72 hours.   Microbiology: Recent Results (from the past 720 hour(s))  Surgical pcr screen     Status: Abnormal   Collection Time: 08/04/14  2:15 PM  Result Value Ref Range Status   MRSA, PCR NEGATIVE NEGATIVE Final   Staphylococcus aureus POSITIVE (A) NEGATIVE Final    Comment:        The Xpert SA Assay (FDA approved for NASAL specimens in patients over 3 years of age), is one component of a comprehensive surveillance program.  Test performance has been validated by [facility] for patients greater than or equal to 75 year old. It is not intended to diagnose infection nor to guide or monitor treatment.   Tissue culture     Status: None (Preliminary result)   Collection Time: 08/11/14  9:25 AM  Result Value Ref Range Status   Specimen Description TISSUE RIGHT FOOT  Final   Special Requests NONE  Final   Gram Stain   Final    FEW WBC PRESENT,BOTH PMN AND MONONUCLEAR NO ORGANISMS SEEN Performed at [facility]    Culture   Final    ABUNDANT STAPHYLOCOCCUS AUREUS Note: RIFAMPIN AND GENTAMICIN  SHOULD NOT BE USED AS SINGLE DRUGS FOR TREATMENT OF STAPH INFECTIONS. Performed at [facility]    Report Status PENDING  Incomplete    Medical History: Past Medical History  Diagnosis Date  . Hyperlipidemia     takes Atorvastatin daily  . Coronary artery disease   . Arthritis     "hands, feet, knees, shoulders" (05/05/2014)  . Bipolar disorder   . Chronic kidney disease     "dr says they aren't functioning like they should" (05/05/2014)  . 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 Type II diabetes mellitus     takes Tradjenta daily  . Depression     takes Zoloft daily  . History of bronchitis 53yrs ago  . Peripheral edema     takes Chlothalidone daily  . Joint pain   . Cataracts, bilateral     immature  . Hypertension     takes Metoprolol,Lisinopril,and Apresoline daily    Medications:  Anti-infectives    Start     Dose/Rate Route Frequency Ordered Stop   08/13/14 1800  vancomycin (VANCOCIN) 1,250 mg in sodium chloride 0.9 % 250 mL IVPB     1,250 mg 166.7 mL/hr over 90 Minutes Intravenous Every 24 hours 08/12/14 1910     08/12/14 1200  vancomycin (VANCOCIN) IVPB 1000 mg/200 mL premix  Status:  Discontinued     1,000 mg 200 mL/hr over 60 Minutes Intravenous Every 24 hours 08/11/14 1550  08/12/14 1122   08/12/14 1200  vancomycin (VANCOCIN) 1,250 mg in sodium chloride 0.9 % 250 mL IVPB  Status:  Discontinued     1,250 mg 166.7 mL/hr over 90 Minutes Intravenous Every 24 hours 08/12/14 1122 08/12/14 1910   08/12/14 0000  piperacillin-tazobactam (ZOSYN) IVPB 3.375 g     3.375 g 12.5 mL/hr over 240 Minutes Intravenous Every 8 hours 08/11/14 1550     08/11/14 1600  vancomycin (VANCOCIN) 500 mg in sodium chloride 0.9 % 100 mL IVPB     500 mg 100 mL/hr over 60 Minutes Intravenous  Once 08/11/14 1550 08/11/14 2200   08/11/14 1600  piperacillin-tazobactam (ZOSYN) IVPB 3.375 g     3.375 g 100 mL/hr over 30 Minutes Intravenous  Once 08/11/14 1550        Assessment: 74 year old female with diabetes and right leg wound dehiscence after an ORIF for ankle fracture in January now s/p I&D and removal of deep implants on 4/28 started on antibiotics for concern of possible osteomyelitis and infection. Currently, patient is afebrile and WBC is wnl. Cultures show staph aureus with sensitivities pending.  Plan to add cefazolin and dc zosyn until sensitivities are back.  Goal of Therapy:  Eradication of infection VT 15-20 mcg/ml  Plan:  Cont Vancomycin to 1250 mg IV Q 24 hours  Cefazolin 2gm IV q12 hours F/u sensitivities Check BMET in am  Thanks for allowing pharmacy to be a part of this patient's care.  Excell Seltzer, PharmD Clinical Pharmacist, [phone]

## 2014-08-13 NOTE — Progress Notes (Signed)
Physical Therapy Treatment Patient Details Name: LYDA LUNDIN MRN: [id number] DOB: Jan 18, 1941 Today's Date: 08/13/2014    History of Present Illness Pt admitted for removal of R ankle hardware and I & D. PMH includes DM, HTN, CABG, R ankle fx Jan 2016.    PT Comments    Continue with POC. Pt has w/c for home use that she has been using since her ankle fx  In Jan 2016.   Follow Up Recommendations  Home health PT     Equipment Recommendations  None recommended by PT    Recommendations for Other Services       Precautions / Restrictions Precautions Precautions: Fall Restrictions RLE Weight Bearing: Non weight bearing    Mobility  Bed Mobility         Supine to sit: HOB elevated;Supervision     General bed mobility comments: use of bedrail  Transfers   Equipment used: Rolling walker (2 wheeled)   Sit to Stand: Min guard         General transfer comment: verbal cues for sequencing with stand to sit  Ambulation/Gait Ambulation/Gait assistance: Min guard Ambulation Distance (Feet): 5 Feet Assistive device: Rolling walker (2 wheeled) Gait Pattern/deviations: Step-to pattern Gait velocity: decreased   General Gait Details: Pt able to hop 5' with RW and maintain NWB status.  Poor endurance.     Stairs            Wheelchair Mobility    Modified Rankin (Stroke Patients Only)       Balance                                    Cognition Arousal/Alertness: Awake/alert Behavior During Therapy: WFL for tasks assessed/performed Overall Cognitive Status: Within Functional Limits for tasks assessed                      Exercises      General Comments        Pertinent Vitals/Pain Pain Assessment: 0-10 Pain Score: 2  Pain Location: right ankle at incision Pain Descriptors / Indicators: Burning Pain Intervention(s): Monitored during session;Premedicated before session;Repositioned    Home Living                      Prior Function            PT Goals (current goals can now be found in the care plan section) Progress towards PT goals: Progressing toward goals    Frequency  Min 5X/week    PT Plan Current plan remains appropriate    Co-evaluation             End of Session Equipment Utilized During Treatment: Gait belt Activity Tolerance: Patient tolerated treatment well Patient left: in chair;with call bell/phone within reach     Time: 0805-0818 PT Time Calculation (min) (ACUTE ONLY): 13 min  Charges:  $Therapeutic Activity: 8-22 mins                    G Codes:      Lorriane Shire 08/13/2014, 8:19 AM

## 2014-08-13 NOTE — Progress Notes (Addendum)
    [place] for Infectious Disease  Date of Admission:  08/11/2014  Antibiotics: Vancomycin and zosyn  Subjective: Afebrile, she states that she doesn't have any fever,chills, diarrhea or cough  Objective: Temp:  [98.7 F (37.1 C)-98.9 F (37.2 C)] 98.7 F (37.1 C) (04/30 0446) Pulse Rate:  [55-60] 60 (04/30 0446) Resp:  [16] 16 (04/30 0446) BP: (108-119)/(35-40) 119/35 mmHg (04/30 0446) SpO2:  [96 %-97 %] 97 % (04/30 0446)  General: Awake, alert, nad Skin: no rashes Lungs: CTA B Cor: RRR Abdomen: soft, nt, nd Ext: right leg in cast Neuro = moves all toes  Lab Results Lab Results  Component Value Date   WBC 8.6 08/11/2014   HGB 10.6* 08/11/2014   HCT 33.2* 08/11/2014   MCV 83.0 08/11/2014   PLT 179 08/11/2014    Lab Results  Component Value Date   CREATININE 1.60* 08/11/2014   BUN 30* 08/04/2014   NA 140 08/04/2014   K 4.2 08/04/2014   CL 108 08/04/2014   CO2 21 08/04/2014    Lab Results  Component Value Date   ALT 17 05/01/2014   AST 34 05/01/2014   ALKPHOS 72 05/01/2014   BILITOT 0.9 05/01/2014      Microbiology: Recent Results (from the past 240 hour(s))  Surgical pcr screen     Status: Abnormal   Collection Time: 08/04/14  2:15 PM  Result Value Ref Range Status   MRSA, PCR NEGATIVE NEGATIVE Final   Staphylococcus aureus POSITIVE (A) NEGATIVE Final    Comment:        The Xpert SA Assay (FDA approved for NASAL specimens in patients over 16 years of age), is one component of a comprehensive surveillance program.  Test performance has been validated by [facility] for patients greater than or equal to 76 year old. It is not intended to diagnose infection nor to guide or monitor treatment.   Tissue culture     Status: None (Preliminary result)   Collection Time: 08/11/14  9:25 AM  Result Value Ref Range Status   Specimen Description TISSUE RIGHT FOOT  Final   Special Requests NONE  Final   Gram Stain   Final    FEW WBC PRESENT,BOTH  PMN AND MONONUCLEAR NO ORGANISMS SEEN Performed at [facility]    Culture   Final    ABUNDANT STAPHYLOCOCCUS AUREUS Note: RIFAMPIN AND GENTAMICIN SHOULD NOT BE USED AS SINGLE DRUGS FOR TREATMENT OF STAPH INFECTIONS. Performed at [facility]    Report Status PENDING  Incomplete    Studies/Results: No results found.  Assessment/Plan:  1) osteomyelitis s/p wound dehiscence and hardware removal - on broad spectrum antibiotics, cultures showing staph aureus, but sensitivities are pending. Will change her to vancomycin plus cefazolin. If sensitivities show MSSA then can discharge on cefazolin at 2gm Q 12hr  x 6 wk; if cx show MRSA, then vancomycin alone. Will place picc line orders  2) ckd 3 = will need to dose adjust cefazolin as well as vancomycin. Vancomycin will need to dose at a trough goal of 15-20 if we continue to use, and careful observation and follow up of cr function so that patient does not have vanco induced nephropathy  3) pain with leg cast = defer to ortho if needs to be refitted.   Carlyle Basques, [place] for Infectious Disease [facility] [email or web address] [phone] pager   [phone] cell 08/13/2014, 1:29 PM

## 2014-08-13 NOTE — Progress Notes (Signed)
Peripherally Inserted Central Catheter/Midline Placement  The IV Nurse has discussed with the patient and/or persons authorized to consent for the patient, the purpose of this procedure and the potential benefits and risks involved with this procedure.  The benefits include less needle sticks, lab draws from the catheter and patient may be discharged home with the catheter.  Risks include, but not limited to, infection, bleeding, blood clot (thrombus formation), and puncture of an artery; nerve damage and irregular heat beat.  Alternatives to this procedure were also discussed.  PICC/Midline Placement Documentation  PICC / Midline Single Lumen 123456 PICC Right Basilic 37 cm 0 cm (Active)  Indication for Insertion or Continuance of Line Home intravenous therapies (PICC only) 08/13/2014  3:00 PM  Exposed Catheter (cm) 0 cm 08/13/2014  3:00 PM  Site Assessment Clean;Dry;Intact 08/13/2014  3:00 PM  Line Status Flushed;Saline locked;Blood return noted 08/13/2014  3:00 PM  Dressing Type Transparent 08/13/2014  3:00 PM  Dressing Status Clean;Dry;Intact;Antimicrobial disc in place 08/13/2014  3:00 PM  Line Care Connections checked and tightened 08/13/2014  3:00 PM  Line Adjustment (NICU/IV Team Only) No 08/13/2014  3:00 PM  Dressing Intervention New dressing 08/13/2014  3:00 PM  Dressing Change Due 08/20/14 08/13/2014  3:00 PM       Rolena Infante 08/13/2014, 3:09 PM

## 2014-08-13 NOTE — Progress Notes (Signed)
    Subjective: 2 Days Post-Op Procedure(s) (LRB): REMOVAL OF DEEP IMPLANTS OF RIGHT TIBIAL FIBIAL INCISION AND DRAINAGE OF RIGHT ANKLE WOUND (Right) REMOVAL OF DEEP IMPLANTS OF RIGHT TIBIAL FIBIAL INCISION AND DRAINAGE OF RIGHT ANKLE WOUND (Right) Patient reports pain as 4 on 0-10 scale.   Denies CP or SOB.  Voiding without difficulty. Positive flatus. Objective: Vital signs in last 24 hours: Temp:  [98.6 F (37 C)-98.9 F (37.2 C)] 98.6 F (37 C) (04/30 1402) Pulse Rate:  [55-60] 57 (04/30 1402) Resp:  [16] 16 (04/30 1402) BP: (108-128)/(35-44) 128/44 mmHg (04/30 1402) SpO2:  [96 %-97 %] 96 % (04/30 1402)  Intake/Output from previous day: 04/29 0701 - 04/30 0700 In: 360 [P.O.:360] Out: -  Intake/Output this shift: Total I/O In: 300 [P.O.:300] Out: -   Labs:  Recent Labs  08/11/14 1858  HGB 10.6*    Recent Labs  08/11/14 1858  WBC 8.6  RBC 4.00  HCT 33.2*  PLT 179    Recent Labs  08/11/14 1858  CREATININE 1.60*   No results for input(s): LABPT, INR in the last 72 hours.  Physical Exam: Neurologically intact Sensation intact distally Compartment soft cap refill < 2 sec in all digits  Splint ok.    Assessment/Plan: 2 Days Post-Op Procedure(s) (LRB): REMOVAL OF DEEP IMPLANTS OF RIGHT TIBIAL FIBIAL INCISION AND DRAINAGE OF RIGHT ANKLE WOUND (Right) REMOVAL OF DEEP IMPLANTS OF RIGHT TIBIAL FIBIAL INCISION AND DRAINAGE OF RIGHT ANKLE WOUND (Right) Cont care No change in ortho recommendations  Kemani Heidel D for Dr. Melina Schools [facility] [phone] 08/13/2014, 6:13 PM

## 2014-08-14 LAB — GLUCOSE, CAPILLARY
GLUCOSE-CAPILLARY: 144 mg/dL — AB (ref 70–99)
GLUCOSE-CAPILLARY: 147 mg/dL — AB (ref 70–99)
GLUCOSE-CAPILLARY: 120 mg/dL — AB (ref 70–99)
Glucose-Capillary: 106 mg/dL — ABNORMAL HIGH (ref 70–99)

## 2014-08-14 LAB — BASIC METABOLIC PANEL
Anion gap: 8 (ref 5–15)
BUN: 12 mg/dL (ref 6–20)
CO2: 25 mmol/L (ref 22–32)
Calcium: 9.2 mg/dL (ref 8.9–10.3)
Chloride: 107 mmol/L (ref 101–111)
Creatinine, Ser: 1.4 mg/dL — ABNORMAL HIGH (ref 0.44–1.00)
GFR calc Af Amer: 42 mL/min — ABNORMAL LOW (ref >60)
GFR calc non Af Amer: 36 mL/min — ABNORMAL LOW (ref >60)
GLUCOSE: 105 mg/dL — AB (ref 70–99)
POTASSIUM: 3.6 mmol/L (ref 3.5–5.1)
SODIUM: 140 mmol/L (ref 135–145)

## 2014-08-14 LAB — TISSUE CULTURE

## 2014-08-14 LAB — VANCOMYCIN, TROUGH: VANCOMYCIN TR: 23 ug/mL — AB (ref 10.0–20.0)

## 2014-08-14 MED ORDER — VANCOMYCIN HCL IN DEXTROSE 1-5 GM/200ML-% IV SOLN: 1000.0000 mg | INTRAVENOUS | Status: DC

## 2014-08-14 MED ORDER — ENOXAPARIN SODIUM 40 MG/0.4ML ~~LOC~~ SOLN
40.0000 mg | SUBCUTANEOUS | Status: DC
Start: 2014-08-15 — End: 2014-08-15
  Administered 2014-08-15: 40 mg via SUBCUTANEOUS
  Filled 2014-08-14: qty 0.4

## 2014-08-14 NOTE — Progress Notes (Signed)
ANTIBIOTIC CONSULT NOTE - FOLLOW UP  Pharmacy Consult for Vancomycin Indication: MRSA wound -- long course planned  Allergies  Allergen Reactions  . Codeine Nausea And Vomiting    "Makes me deathly sick"  Patient Measurements: Height: 5' 4.5" (163.8 cm) Weight: 194 lb 4 oz (88.111 kg) IBW/kg (Calculated) : 55.85 Vital Signs: Temp: 98.8 F (37.1 C) (05/01 2035) Temp Source: Oral (05/01 2035) BP: 131/45 mmHg (05/01 2035) Pulse Rate: 65 (05/01 2035) Intake/Output from this shift:    Labs:  Recent Labs  08/14/14 0605  CREATININE 1.40*   Estimated Creatinine Clearance: 38.3 mL/min (by C-G formula based on Cr of 1.4).  Recent Labs  08/14/14 2042  VANCOTROUGH 23*     Microbiology: Recent Results (from the past 720 hour(s))  Surgical pcr screen     Status: Abnormal   Collection Time: 08/04/14  2:15 PM  Result Value Ref Range Status   MRSA, PCR NEGATIVE NEGATIVE Final   Staphylococcus aureus POSITIVE (A) NEGATIVE Final    Comment:        The Xpert SA Assay (FDA approved for NASAL specimens in patients over 82 years of age), is one component of a comprehensive surveillance program.  Test performance has been validated by [facility] for patients greater than or equal to 50 year old. It is not intended to diagnose infection nor to guide or monitor treatment.   Tissue culture     Status: None   Collection Time: 08/11/14  9:25 AM  Result Value Ref Range Status   Specimen Description TISSUE RIGHT FOOT  Final   Special Requests NONE  Final   Gram Stain   Final    FEW WBC PRESENT,BOTH PMN AND MONONUCLEAR NO ORGANISMS SEEN Performed at [facility]    Culture   Final    ABUNDANT METHICILLIN RESISTANT STAPHYLOCOCCUS AUREUS Note: RIFAMPIN AND GENTAMICIN SHOULD NOT BE USED AS SINGLE DRUGS FOR TREATMENT OF STAPH INFECTIONS. CRITICAL RESULT CALLED TO, READ BACK BY AND VERIFIED WITH:  DANIELLE @ 0745 ON 08/14/14 BY KENDR Performed at [facility]    Report Status 08/14/2014 FINAL  Final   Organism ID, Bacteria METHICILLIN RESISTANT STAPHYLOCOCCUS AUREUS  Final      Susceptibility   Methicillin resistant staphylococcus aureus - MIC*    CLINDAMYCIN <=0.25 SENSITIVE Sensitive     ERYTHROMYCIN <=0.25 SENSITIVE Sensitive     GENTAMICIN <=0.5 SENSITIVE Sensitive     LEVOFLOXACIN >=8 RESISTANT Resistant     OXACILLIN >=4 RESISTANT Resistant     PENICILLIN >=0.5 RESISTANT Resistant     RIFAMPIN <=0.5 SENSITIVE Sensitive     TRIMETH/SULFA <=10 SENSITIVE Sensitive     VANCOMYCIN <=0.5 SENSITIVE Sensitive     TETRACYCLINE <=1 SENSITIVE Sensitive     * ABUNDANT METHICILLIN RESISTANT STAPHYLOCOCCUS AUREUS     Assessment: 74 year old female with diabetes and right leg wound dehiscence after an ORIF for ankle fracture in January now s/p I&D and removal of deep implants on 4/28 started on antibiotics for concern of possible osteomyelitis and infection.  Vanc 4/28 >> Zosyn 4/28 >>  4/28 Anaerobic cx >> 4/28 Fungus cx >> 4/28 Tissue cx >>MRSA  Vancomycin trough = 23 mcg / dL   Goal of Therapy:  Vancomycin trough level 15-20 mcg/ml  Plan:  Decrease vancomycin to 1 gram iv Q 24 hour starting 5/3 at 8 am Continue to follow  Thank you Anette Guarneri, PharmD [phone]

## 2014-08-14 NOTE — Progress Notes (Signed)
Patient ID: [name], female   DOB: 11/05/1940, 74 y.o.   MRN: [id number] Subjective: 3 Days Post-Op Procedure(s) (LRB): REMOVAL OF DEEP IMPLANTS OF RIGHT TIBIAL FIBIAL INCISION AND DRAINAGE OF RIGHT ANKLE WOUND (Right) REMOVAL OF DEEP IMPLANTS OF RIGHT TIBIAL FIBIAL INCISION AND DRAINAGE OF RIGHT ANKLE WOUND (Right)    Patient reports pain as mild.  Feels some pain through splint  Objective:   VITALS:   Filed Vitals:   08/14/14 1443  BP: 131/47  Pulse: 84  Temp: 98.3 F (36.8 C)  Resp: 18    Neurovascular intact splint intact, able to pass fingers distally and proximally  LABS  Recent Labs  08/11/14 1858  HGB 10.6*  HCT 33.2*  WBC 8.6  PLT 179     Recent Labs  08/11/14 1858 08/14/14 0605  NA  --  140  K  --  3.6  BUN  --  12  CREATININE 1.60* 1.40*  GLUCOSE  --  105*    No results for input(s): LABPT, INR in the last 72 hours.   Assessment/Plan: 3 Days Post-Op Procedure(s) (LRB): REMOVAL OF DEEP IMPLANTS OF RIGHT TIBIAL FIBIAL INCISION AND DRAINAGE OF RIGHT ANKLE WOUND (Right) REMOVAL OF DEEP IMPLANTS OF RIGHT TIBIAL FIBIAL INCISION AND DRAINAGE OF RIGHT ANKLE WOUND (Right)   Up with therapy Continue ABX therapy due to infection in right leg already identified  PIC line in place for antibiotic threapy Plan for discharge tomorrow Discharge home with home health

## 2014-08-14 NOTE — Progress Notes (Signed)
Received wound culture results from lab showing pt is growing MRSA in her wound culture. MD notified.

## 2014-08-15 LAB — GLUCOSE, CAPILLARY
GLUCOSE-CAPILLARY: 139 mg/dL — AB (ref 70–99)
Glucose-Capillary: 146 mg/dL — ABNORMAL HIGH (ref 70–99)

## 2014-08-15 MED ORDER — DOCUSATE SODIUM 100 MG PO CAPS: 100.0000 mg | ORAL_CAPSULE | Freq: Two times a day (BID) | ORAL | Status: DC

## 2014-08-15 MED ORDER — HEPARIN SOD (PORK) LOCK FLUSH 100 UNIT/ML IV SOLN
250.0000 [IU] | INTRAVENOUS | Status: AC | PRN
  Administered 2014-08-15: 250 [IU]

## 2014-08-15 MED ORDER — OXYCODONE HCL 5 MG PO TABS: 5.0000 mg | ORAL_TABLET | ORAL | Status: DC | PRN

## 2014-08-15 MED ORDER — SENNA 8.6 MG PO TABS: 2.0000 | ORAL_TABLET | Freq: Two times a day (BID) | ORAL | Status: DC

## 2014-08-15 MED ORDER — VANCOMYCIN HCL IN DEXTROSE 1-5 GM/200ML-% IV SOLN: 1000.0000 mg | INTRAVENOUS | Status: AC

## 2014-08-15 NOTE — Discharge Summary (Signed)
Physician Discharge Summary  Patient ID: [name] MRN: [id number] DOB/AGE: [birth year] 74 y.o.  Admit date: 08/11/2014 Discharge date: 08/15/2014  Admission Diagnoses:  Diabetes, htn, R ankle wound dehiscence s/p ORIF of right ankle fracture  Discharge Diagnoses:  Active Problems:   Osteomyelitis of ankle or foot same as above s/p I and D and removal of hardware from the right ankle  Discharged Condition: stable  Hospital Course: Pt was admitted and taken to the OR where she underwent removal of deep implants and I and d of her right ankle wound.  She tolerated the procedure well and was started post op on vanc and zosyn.  Her cultures grew MRSA, and the zosyn was stopped.  She is discharged to home with [facility] PT and RN for IV vanc.  Consults: ID  Significant Diagnostic Studies: micro for cxs  Treatments: antibiotics: vancomycin  Discharge Exam: Blood pressure 132/52, pulse 66, temperature 98 F (36.7 C), temperature source Oral, resp. rate 17, height 5' 4.5" (1.638 m), weight 88.111 kg (194 lb 4 oz), SpO2 97 %. wn wd woman in nad.  A and o x 4.  Mood normal.  R LE NVI.  splint intact.  Disposition: home  Discharge Instructions    Call MD / Call 911    Complete by:  As directed   If you experience chest pain or shortness of breath, CALL 911 and be transported to the hospital emergency room.  If you develope a fever above 101 F, pus (white drainage) or increased drainage or redness at the wound, or calf pain, call your surgeon's office.     Constipation Prevention    Complete by:  As directed   Drink plenty of fluids.  Prune juice may be helpful.  You may use a stool softener, such as Colace (over the counter) 100 mg twice a day.  Use MiraLax (over the counter) for constipation as needed.     Diet - low sodium heart healthy    Complete by:  As directed      Increase activity slowly as tolerated    Complete by:  As directed      Non weight bearing    Complete by:  As  directed   Laterality:  right  Extremity:  Lower            Medication List    STOP taking these medications        oxyCODONE-acetaminophen 5-325 MG per tablet  Commonly known as:  ROXICET     traMADol 50 MG tablet  Commonly known as:  ULTRAM     traMADol-acetaminophen 37.5-325 MG per tablet  Commonly known as:  ULTRACET      TAKE these medications        aspirin EC 325 MG tablet  Take 325 mg by mouth daily.     atorvastatin 40 MG tablet  Commonly known as:  LIPITOR  Take 40 mg by mouth at bedtime.     chlorthalidone 25 MG tablet  Commonly known as:  HYGROTON  Take 25 mg by mouth daily.     docusate sodium 100 MG capsule  Commonly known as:  COLACE  Take 1 capsule (100 mg total) by mouth 2 (two) times daily.     enoxaparin 30 MG/0.3ML injection  Commonly known as:  LOVENOX  Inject 0.3 mLs (30 mg total) into the skin every 12 (twelve) hours.     hydrALAZINE 50 MG tablet  Commonly known as:  APRESOLINE  Take  50 mg by mouth 3 (three) times daily.     linagliptin 5 MG Tabs tablet  Commonly known as:  TRADJENTA  Take 5 mg by mouth daily.     lisinopril 40 MG tablet  Commonly known as:  PRINIVIL,ZESTRIL  Take 40 mg by mouth daily.     metoprolol succinate 100 MG 24 hr tablet  Commonly known as:  TOPROL-XL  Take 100 mg by mouth daily.     oxyCODONE 5 MG immediate release tablet  Commonly known as:  Oxy IR/ROXICODONE  Take 1 tablet (5 mg total) by mouth every 4 (four) hours as needed for moderate pain or severe pain.     senna 8.6 MG Tabs tablet  Commonly known as:  SENOKOT  Take 2 tablets (17.2 mg total) by mouth 2 (two) times daily.     sertraline 50 MG tablet  Commonly known as:  ZOLOFT  Take 50 mg by mouth daily.     vancomycin 1 GM/200ML Soln  Commonly known as:  VANCOCIN  Inject 200 mLs (1,000 mg total) into the vein daily.  Start taking on:  08/16/2014           Follow-up Information    Follow up with Ananias Kolander, Jenny Reichmann, MD. Schedule an  appointment as soon as possible for a visit in 2 weeks.   Specialty:  Orthopedic Surgery   Contact information:   [address] [place] [address] [phone]       Follow up with Carlyle Basques, MD In 2 weeks.   Specialty:  Infectious Diseases   Contact information:   [place] [address] [phone]       Signed: Wylene Simmer 08/15/2014, 7:41 AM

## 2014-08-15 NOTE — Care Management Note (Signed)
Case Management Note  Patient Details  Name: [name] MRN: [id number] Date of Birth: 10/01/1940    Expected Discharge Date:  08/15/14               Expected Discharge Plan:  [place]   Discharge planning Services  CM Consult  Post Acute Care Choice:  Home Health Choice offered to:  Patient   HH Arranged:  RN, IV Antibiotics, PT HH Agency:  [place]  Status of Service:  Completed, signed off  Medicare Important Message Given:  Yes Date Medicare IM Given:  08/15/14 Medicare IM give by:  Barbie Haggis, RN 08/15/2014, 11:10 AM

## 2014-08-15 NOTE — Discharge Instructions (Signed)
Suzanne Simmer, MD [place]  Please read the following information regarding your care after surgery.  Medications  You only need a prescription for the narcotic pain medicine (ex. oxycodone, Percocet, Norco).  All of the other medicines listed below are available over the counter. X acetominophen (Tylenol) 650 mg every 4-6 hours as you need for minor pain X oxycodone as prescribed for moderate to severe pain ?   Narcotic pain medicine (ex. oxycodone, Percocet, Vicodin) will cause constipation.  To prevent this problem, take the following medicines while you are taking any pain medicine. X docusate sodium (Colace) 100 mg twice a day X senna (Senokot) 2 tablets twice a day  X To help prevent blood clots, take an aspirin (325 mg) once a day for a month after surgery.  You should also get up every hour while you are awake to move around.    Weight Bearing ? Bear weight when you are able on your operated leg or foot. ? Bear weight only on the heel of your operated foot in the post-op shoe. X Do not bear any weight on the operated leg or foot.  Cast / Splint / Dressing X Keep your splint or cast clean and dry.  Dont put anything (coat hanger, pencil, etc) down inside of it.  If it gets damp, use a hair dryer on the cool setting to dry it.  If it gets soaked, call the office to schedule an appointment for a cast change. ? Remove your dressing 3 days after surgery and cover the incisions with dry dressings.    After your dressing, cast or splint is removed; you may shower, but do not soak or scrub the wound.  Allow the water to run over it, and then gently pat it dry.  Swelling It is normal for you to have swelling where you had surgery.  To reduce swelling and pain, keep your toes above your nose for at least 3 days after surgery.  It may be necessary to keep your foot or leg elevated for several weeks.  If it hurts, it should be elevated.  Follow Up Call my office at  [phone] when you are discharged from the hospital or surgery center to schedule an appointment to be seen two weeks after surgery.  Call my office at [phone] if you develop a fever >101.5 F, nausea, vomiting, bleeding from the surgical site or severe pain.

## 2014-08-15 NOTE — Progress Notes (Signed)
Physical Therapy Treatment Patient Details Name: ROSALIN CAUSER MRN: [id number] DOB: January 08, 1941 Today's Date: 08/15/2014    History of Present Illness Pt admitted for removal of R ankle hardware and I & D. PMH includes DM, HTN, CABG, R ankle fx Jan 2016.    PT Comments    Pt is making progress toward goals and increasing functional independence. Pt would benefit from continued PT to further increase functional independence and safety. Continue to recommend HHPT for ongoing PT. Plans to D/C home later today.  Follow Up Recommendations  Home health PT     Equipment Recommendations  None recommended by PT    Recommendations for Other Services       Precautions / Restrictions Precautions Precautions: Fall Restrictions Weight Bearing Restrictions: Yes RLE Weight Bearing: Non weight bearing    Mobility  Bed Mobility Overal bed mobility: Modified Independent Bed Mobility: Supine to Sit              Transfers Overall transfer level: Needs assistance Equipment used: Rolling walker (2 wheeled) Transfers: Sit to/from Stand Sit to Stand: Min guard         General transfer comment: Min guard for safety. Cues for hand placement and safety for sit from stand.  Ambulation/Gait Ambulation/Gait assistance: Min guard Ambulation Distance (Feet): 5 Feet Assistive device: Rolling walker (2 wheeled) Gait Pattern/deviations: Step-to pattern   Gait velocity interpretation: Below normal speed for age/gender General Gait Details: Pt able to hop from bed to chair with RW while maintaining NWB status.   Stairs            Wheelchair Mobility    Modified Rankin (Stroke Patients Only)       Balance                                    Cognition Arousal/Alertness: Awake/alert Behavior During Therapy: WFL for tasks assessed/performed Overall Cognitive Status: Within Functional Limits for tasks assessed                      Exercises       General Comments        Pertinent Vitals/Pain Pain Assessment: 0-10 Pain Score: 3  Pain Location: R ankle Pain Descriptors / Indicators: Sore;Discomfort Pain Intervention(s): Monitored during session;Repositioned    Home Living                      Prior Function            PT Goals (current goals can now be found in the care plan section) Progress towards PT goals: Progressing toward goals    Frequency  Min 5X/week    PT Plan Current plan remains appropriate    Co-evaluation             End of Session   Activity Tolerance: Patient tolerated treatment well Patient left: in chair;with call bell/phone within reach     Time: [id number] PT Time Calculation (min) (ACUTE ONLY): 8 min  Charges:                       G CodesRubye Oaks, SPTA 08/15/2014, 8:53 AM

## 2014-08-15 NOTE — Progress Notes (Signed)
Patient provided with discharge instructions and follow up information. Patient going home with [facility] from [place] to administer IV antibiotics. Awaiting ride home and will be discharged upon arrival.

## 2014-08-22 LAB — ANAEROBIC CULTURE

## 2014-09-12 LAB — FUNGUS CULTURE W SMEAR: FUNGAL SMEAR: NONE SEEN

## 2014-09-13 ENCOUNTER — Encounter: Payer: Self-pay | Admitting: Internal Medicine

## 2014-09-13 ENCOUNTER — Ambulatory Visit (INDEPENDENT_AMBULATORY_CARE_PROVIDER_SITE_OTHER): Payer: Medicare PPO | Admitting: Internal Medicine

## 2014-09-13 VITALS — BP 142/87 | HR 60 | Temp 97.9°F

## 2014-09-13 DIAGNOSIS — M869 Osteomyelitis, unspecified: Secondary | ICD-10-CM

## 2014-09-13 DIAGNOSIS — T847XXS Infection and inflammatory reaction due to other internal orthopedic prosthetic devices, implants and grafts, sequela: Secondary | ICD-10-CM

## 2014-09-13 DIAGNOSIS — M00271 Other streptococcal arthritis, right ankle and foot: Secondary | ICD-10-CM

## 2014-09-13 DIAGNOSIS — B9562 Methicillin resistant Staphylococcus aureus infection as the cause of diseases classified elsewhere: Secondary | ICD-10-CM

## 2014-09-13 LAB — CBC WITH DIFFERENTIAL/PLATELET
Basophils Absolute: 0.1 10*3/uL (ref 0.0–0.1)
Basophils Relative: 1 % (ref 0–1)
Eosinophils Absolute: 0.3 10*3/uL (ref 0.0–0.7)
Eosinophils Relative: 4 % (ref 0–5)
HCT: 35.4 % — ABNORMAL LOW (ref 36.0–46.0)
HEMOGLOBIN: 11.6 g/dL — AB (ref 12.0–15.0)
LYMPHS PCT: 27 % (ref 12–46)
Lymphs Abs: 2 10*3/uL (ref 0.7–4.0)
MCH: 26.8 pg (ref 26.0–34.0)
MCHC: 32.8 g/dL (ref 30.0–36.0)
MCV: 81.8 fL (ref 78.0–100.0)
MONO ABS: 0.6 10*3/uL (ref 0.1–1.0)
MONOS PCT: 8 % (ref 3–12)
MPV: 9.6 fL (ref 8.6–12.4)
NEUTROS ABS: 4.4 10*3/uL (ref 1.7–7.7)
Neutrophils Relative %: 60 % (ref 43–77)
Platelets: 204 10*3/uL (ref 150–400)
RBC: 4.33 MIL/uL (ref 3.87–5.11)
RDW: 15 % (ref 11.5–15.5)
WBC: 7.3 10*3/uL (ref 4.0–10.5)

## 2014-09-13 LAB — BASIC METABOLIC PANEL
BUN: 28 mg/dL — ABNORMAL HIGH (ref 6–23)
CO2: 22 mEq/L (ref 19–32)
CREATININE: 1.33 mg/dL — AB (ref 0.50–1.10)
Calcium: 9.7 mg/dL (ref 8.4–10.5)
Chloride: 104 mEq/L (ref 96–112)
Glucose, Bld: 109 mg/dL — ABNORMAL HIGH (ref 70–99)
Potassium: 4 mEq/L (ref 3.5–5.3)
Sodium: 137 mEq/L (ref 135–145)

## 2014-09-13 LAB — SEDIMENTATION RATE: Sed Rate: 27 mm/hr (ref 0–30)

## 2014-09-13 LAB — C-REACTIVE PROTEIN: CRP: <0.5 mg/dL (ref <0.60)
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
## 2014-09-14 LAB — VANCOMYCIN, RANDOM: VANCOMYCIN RM: 21.6 ug/mL

## 2014-09-15 ENCOUNTER — Telehealth: Payer: Self-pay | Admitting: *Deleted

## 2014-09-15 NOTE — Telephone Encounter (Signed)
Call from nurse with [facility] stating patient's picc line is clogged and it is taking 4 hours to administer vancomycin, also can not draw blood from line. Asked if they had protocol for cath flow and said they do not administer this in the home. Advised that patient would have to go to ED to have this administered.

## 2014-09-16 ENCOUNTER — Encounter: Payer: Self-pay | Admitting: Emergency Medicine

## 2014-09-16 ENCOUNTER — Emergency Department
Admission: EM | Admit: 2014-09-16 | Discharge: 2014-09-16 | Disposition: A | Discharge status: Home / Self Care | Payer: Medicare PPO | Attending: Emergency Medicine | Admitting: Emergency Medicine

## 2014-09-16 ENCOUNTER — Emergency Department: Payer: Medicare PPO

## 2014-09-16 DIAGNOSIS — T82398A Other mechanical complication of other vascular grafts, initial encounter: Secondary | ICD-10-CM | POA: Insufficient documentation

## 2014-09-16 DIAGNOSIS — Z792 Long term (current) use of antibiotics: Secondary | ICD-10-CM | POA: Insufficient documentation

## 2014-09-16 DIAGNOSIS — I129 Hypertensive chronic kidney disease with stage 1 through stage 4 chronic kidney disease, or unspecified chronic kidney disease: Secondary | ICD-10-CM | POA: Insufficient documentation

## 2014-09-16 DIAGNOSIS — Z79899 Other long term (current) drug therapy: Secondary | ICD-10-CM | POA: Insufficient documentation

## 2014-09-16 DIAGNOSIS — N183 Chronic kidney disease, stage 3 (moderate): Secondary | ICD-10-CM | POA: Diagnosis not present

## 2014-09-16 DIAGNOSIS — Y832 Surgical operation with anastomosis, bypass or graft as the cause of abnormal reaction of the patient, or of later complication, without mention of misadventure at the time of the procedure: Secondary | ICD-10-CM | POA: Insufficient documentation

## 2014-09-16 DIAGNOSIS — T82898A Other specified complication of vascular prosthetic devices, implants and grafts, initial encounter: Secondary | ICD-10-CM

## 2014-09-16 DIAGNOSIS — Z7982 Long term (current) use of aspirin: Secondary | ICD-10-CM | POA: Diagnosis not present

## 2014-09-16 DIAGNOSIS — E119 Type 2 diabetes mellitus without complications: Secondary | ICD-10-CM | POA: Insufficient documentation

## 2014-09-16 MED ORDER — HEPARIN SOD (PORK) LOCK FLUSH 100 UNIT/ML IV SOLN
500.0000 [IU] | Freq: Once | INTRAVENOUS | Status: AC
  Administered 2014-09-16: 500 [IU] via INTRAVENOUS

## 2014-09-16 MED ORDER — HEPARIN SOD (PORK) LOCK FLUSH 10 UNIT/ML IV SOLN
INTRAVENOUS | Status: AC
  Filled 2014-09-16: qty 1

## 2014-09-16 MED ORDER — SODIUM CHLORIDE 0.9 % IJ SOLN
INTRAMUSCULAR | Status: AC
  Filled 2014-09-16: qty 15

## 2014-09-16 MED ORDER — HEPARIN SOD (PORK) LOCK FLUSH 100 UNIT/ML IV SOLN
INTRAVENOUS | Status: AC
  Administered 2014-09-16: 500 [IU] via INTRAVENOUS
  Filled 2014-09-16: qty 10
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
## 2014-09-16 NOTE — ED Notes (Signed)
Vascular lab called per MD to evaluate PICC line

## 2014-09-16 NOTE — ED Notes (Signed)
Pt has PICC line in place to right arm, states she has been getting IV antibiotics infusions at home for wound to right foot that was positive for MRSA, states yesterday infusion were going in slower and she is meeting resistance when trying to flush, pt states her home health nurse came to home and called MD and was advised to come to ED to have PICC line evaluated

## 2014-09-16 NOTE — Discharge Instructions (Signed)
PICC Home Guide A peripherally inserted central catheter (PICC) is a long, thin, flexible tube that is inserted into a vein in the upper arm. It is a form of intravenous (IV) access. It is considered to be a "central" line because the tip of the PICC ends in a large vein in your chest. This large vein is called the superior vena cava (SVC). The PICC tip ends in the SVC because there is a lot of blood flow in the SVC. This allows medicines and IV fluids to be quickly distributed throughout the body. The PICC is inserted using a sterile technique by a specially trained nurse or physician. After the PICC is inserted, a chest X-ray exam is done to be sure it is in the correct place.  A PICC may be placed for different reasons, such as:  To give medicines and liquid nutrition that can only be given through a central line. Examples are:  Certain antibiotic treatments.  Chemotherapy.  Total parenteral nutrition (TPN).  To take frequent blood samples.  To give IV fluids and blood products.  If there is difficulty placing a peripheral intravenous (PIV) catheter. If taken care of properly, a PICC can remain in place for several months. A PICC can also allow a person to go home from the hospital early. Medicine and PICC care can be managed at home by a family member or home health care team. WHAT PROBLEMS CAN HAPPEN WHEN I HAVE A PICC? Problems with a PICC can occasionally occur. These may include the following:  A blood clot (thrombus) forming in or at the tip of the PICC. This can cause the PICC to become clogged. A clot-dissolving medicine called tissue plasminogen activator (tPA) can be given through the PICC to help break up the clot.  Inflammation of the vein (phlebitis) in which the PICC is placed. Signs of inflammation may include redness, pain at the insertion site, red streaks, or being able to feel a "cord" in the vein where the PICC is located.  Infection in the PICC or at the insertion  site. Signs of infection may include fever, chills, redness, swelling, or pus drainage from the PICC insertion site.  PICC movement (malposition). The PICC tip may move from its original position due to excessive physical activity, forceful coughing, sneezing, or vomiting.  A break or cut in the PICC. It is important to not use scissors near the PICC.  Nerve or tendon irritation or injury during PICC insertion. WHAT SHOULD I KEEP IN MIND ABOUT ACTIVITIES WHEN I HAVE A PICC?  You may bend your arm and move it freely. If your PICC is near or at the bend of your elbow, avoid activity with repeated motion at the elbow.  Rest at home for the remainder of the day following PICC line insertion.  Avoid lifting heavy objects as instructed by your health care provider.  Avoid using a crutch with the arm on the same side as your PICC. You may need to use a walker. WHAT SHOULD I KNOW ABOUT MY PICC DRESSING?  Keep your PICC bandage (dressing) clean and dry to prevent infection.  Ask your health care provider when you may shower. Ask your health care provider to teach you how to wrap the PICC when you do take a shower.  Change the PICC dressing as instructed by your health care provider.  Change your PICC dressing if it becomes loose or wet. WHAT SHOULD I KNOW ABOUT PICC CARE?  Check the PICC insertion site   daily for leakage, redness, swelling, or pain.  Do not take a bath, swim, or use hot tubs when you have a PICC. Cover PICC line with clear plastic wrap and tape to keep it dry while showering.  Flush the PICC as directed by your health care provider. Let your health care provider know right away if the PICC is difficult to flush or does not flush. Do not use force to flush the PICC.  Do not use a syringe that is less than 10 mL to flush the PICC.  Never pull or tug on the PICC.  Avoid blood pressure checks on the arm with the PICC.  Keep your PICC identification card with you at all  times.  Do not take the PICC out yourself. Only a trained clinical professional should remove the PICC. SEEK IMMEDIATE MEDICAL CARE IF:  Your PICC is accidentally pulled all the way out. If this happens, cover the insertion site with a bandage or gauze dressing. Do not throw the PICC away. Your health care provider will need to inspect it.  Your PICC was tugged or pulled and has partially come out. Do not  push the PICC back in.  There is any type of drainage, redness, or swelling where the PICC enters the skin.  You cannot flush the PICC, it is difficult to flush, or the PICC leaks around the insertion site when it is flushed.  You hear a "flushing" sound when the PICC is flushed.  You have pain, discomfort, or numbness in your arm, shoulder, or jaw on the same side as the PICC.  You feel your heart "racing" or skipping beats.  You notice a hole or tear in the PICC.  You develop chills or a fever. MAKE SURE YOU:   Understand these instructions.  Will watch your condition.  Will get help right away if you are not doing well or get worse. Document Released: 10/06/2002 Document Revised: 08/16/2013 Document Reviewed: 12/07/2012 Exit[employer] Patient Information 2015 [employer]. This information is not intended to replace advice given to you by your health care provider. Make sure you discuss any questions you have with your health care provider.  

## 2014-09-19 NOTE — Telephone Encounter (Signed)
Thanks for letting me know!

## 2014-09-20 ENCOUNTER — Telehealth: Payer: Self-pay | Admitting: *Deleted

## 2014-09-20 NOTE — Telephone Encounter (Signed)
Order given to Algernon Huxley at [facility] to stop antibiotics and pull picc on 6/14 per Dr. Baxter Flattery.

## 2014-09-26 ENCOUNTER — Telehealth: Payer: Self-pay | Admitting: *Deleted

## 2014-09-26 NOTE — Telephone Encounter (Signed)
Patient called to advise that she finished her medication 09/23/14 but that the home health is not pulling the PICC until 09/27/14. Advised her she is fine and does not need any additional medication and her line will be pulled 09/27/14.

## 2014-09-27 ENCOUNTER — Other Ambulatory Visit: Payer: Self-pay | Admitting: *Deleted

## 2014-09-27 MED ORDER — ONDANSETRON HCL 4 MG PO TABS: 4.0000 mg | ORAL_TABLET | Freq: Two times a day (BID) | ORAL | Status: DC | PRN

## 2014-09-27 MED ORDER — DOXYCYCLINE HYCLATE 100 MG PO TABS: 100.0000 mg | ORAL_TABLET | Freq: Two times a day (BID) | ORAL | Status: DC

## 2014-09-30 ENCOUNTER — Ambulatory Visit: Payer: Medicare PPO | Admitting: Internal Medicine

## 2014-10-03 ENCOUNTER — Ambulatory Visit (INDEPENDENT_AMBULATORY_CARE_PROVIDER_SITE_OTHER): Payer: Medicare PPO | Admitting: Internal Medicine

## 2014-10-03 ENCOUNTER — Encounter: Payer: Self-pay | Admitting: Internal Medicine

## 2014-10-03 VITALS — BP 106/66 | HR 64 | Temp 98.3°F | Ht 64.0 in | Wt 189.0 lb

## 2014-10-03 DIAGNOSIS — T847XXD Infection and inflammatory reaction due to other internal orthopedic prosthetic devices, implants and grafts, subsequent encounter: Secondary | ICD-10-CM

## 2014-10-03 DIAGNOSIS — M869 Osteomyelitis, unspecified: Secondary | ICD-10-CM | POA: Diagnosis not present

## 2014-10-03 DIAGNOSIS — T847XXS Infection and inflammatory reaction due to other internal orthopedic prosthetic devices, implants and grafts, sequela: Secondary | ICD-10-CM
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
## 2014-11-01 ENCOUNTER — Ambulatory Visit (INDEPENDENT_AMBULATORY_CARE_PROVIDER_SITE_OTHER): Payer: Medicare PPO | Admitting: Internal Medicine

## 2014-11-01 ENCOUNTER — Encounter: Payer: Self-pay | Admitting: Internal Medicine

## 2014-11-01 VITALS — BP 124/71 | HR 82 | Temp 98.7°F | Wt 195.0 lb

## 2014-11-01 DIAGNOSIS — T847XXD Infection and inflammatory reaction due to other internal orthopedic prosthetic devices, implants and grafts, subsequent encounter: Secondary | ICD-10-CM | POA: Diagnosis not present

## 2014-11-01 DIAGNOSIS — T847XXS Infection and inflammatory reaction due to other internal orthopedic prosthetic devices, implants and grafts, sequela: Secondary | ICD-10-CM | POA: Diagnosis not present

## 2014-11-01 DIAGNOSIS — M869 Osteomyelitis, unspecified: Secondary | ICD-10-CM | POA: Diagnosis not present

## 2014-11-01 DIAGNOSIS — A4902 Methicillin resistant Staphylococcus aureus infection, unspecified site: Secondary | ICD-10-CM

## 2014-11-01 LAB — C-REACTIVE PROTEIN

## 2014-11-01 NOTE — Progress Notes (Signed)
Subjective:    Patient ID: [name], female    DOB: Sep 29, 1940, 74 y.o.   MRN: [id number]  HPI [name] is a 74y/o female with MRSA infection of right tibia/fibula hardware and hardware removal on 5/28. She had been on vancomycin x 6 wk and then transitioned to doxycyline for the last week to finish out a total of 4 wks of oral antibiotics. At her last visit one month ago she had been doing well. Plan was to finish out the last three weeks of doxy and stop them if she was doing well. She was just informed by ortho that she no longer has to wear her ortho boot. Her incision is well healed. She is feeling good and denies any nausea or diarrhea with doxycycline  Outpatient Encounter Prescriptions as of 11/01/2014  Medication Sig  . aspirin EC 325 MG tablet Take 325 mg by mouth daily.  Marland Kitchen atorvastatin (LIPITOR) 40 MG tablet Take 40 mg by mouth at bedtime.  . chlorthalidone (HYGROTON) 25 MG tablet Take 25 mg by mouth daily.   Marland Kitchen docusate sodium (COLACE) 100 MG capsule Take 1 capsule (100 mg total) by mouth 2 (two) times daily.  Marland Kitchen doxycycline (VIBRA-TABS) 100 MG tablet Take 1 tablet (100 mg total) by mouth 2 (two) times daily. To take after IV antibiotics are done  . hydrALAZINE (APRESOLINE) 50 MG tablet Take 50 mg by mouth 3 (three) times daily.   Marland Kitchen linagliptin (TRADJENTA) 5 MG TABS tablet Take 5 mg by mouth daily.  Marland Kitchen lisinopril (PRINIVIL,ZESTRIL) 40 MG tablet Take 40 mg by mouth daily.   . ondansetron (ZOFRAN) 4 MG tablet Take 1 tablet (4 mg total) by mouth every 12 (twelve) hours as needed for nausea or vomiting.  Marland Kitchen oxyCODONE (OXY IR/ROXICODONE) 5 MG immediate release tablet Take 1 tablet (5 mg total) by mouth every 4 (four) hours as needed for moderate pain or severe pain.  Marland Kitchen senna (SENOKOT) 8.6 MG TABS tablet Take 2 tablets (17.2 mg total) by mouth 2 (two) times daily.  . sertraline (ZOLOFT) 50 MG tablet Take 50 mg by mouth daily.   . traMADol-acetaminophen (ULTRACET) 37.5-325 MG per  tablet Take 1 tablet by mouth every 6 (six) hours as needed.   No facility-administered encounter medications on file as of 11/01/2014.    Review of Systems  Constitutional: Negative for fever, chills, diaphoresis, fatigue and unexpected weight change.  HENT: Negative.   Eyes: Negative.   Respiratory: Negative for cough, chest tightness and shortness of breath.   Cardiovascular: Positive for leg swelling.       Mild edema around right ankle, states this has improved from baseline.   Gastrointestinal: Negative for nausea, vomiting, abdominal pain, diarrhea, constipation and abdominal distention.  Genitourinary: Negative for difficulty urinating.  Musculoskeletal: Negative for myalgias and arthralgias.       Mild joint pain which she states has greatly improved over time.   Skin:       Surgical wounds have healed over.   Neurological: Positive for weakness and numbness. Negative for dizziness.       Weakness of right leg which has been improving since her surgery. Paresthesias to top of right foot which have been present since surgery.   Hematological: Negative for adenopathy.  Psychiatric/Behavioral: Negative for suicidal ideas, sleep disturbance and self-injury. The patient is not nervous/anxious.        Objective:   Physical Exam  Constitutional: She is oriented to person, place, and time. She appears  well-developed and well-nourished.  HENT:  Head: Normocephalic and atraumatic.  Eyes: Conjunctivae are normal. Pupils are equal, round, and reactive to light.  Neck: Normal range of motion. Neck supple.  Cardiovascular: Normal rate and regular rhythm.   Pulmonary/Chest: Effort normal and breath sounds normal.  Abdominal: Soft. Bowel sounds are normal.  Musculoskeletal: She exhibits tenderness.  Mild right ankle tenderness when palpated. Right ankle is in ortho boot. Uses walker.   Lymphadenopathy:    She has no cervical adenopathy.  Neurological: She is alert and oriented to  person, place, and time.  Skin: Skin is warm and dry.  Psychiatric: She has a normal mood and affect. Her behavior is normal. Judgment and thought content normal.   Blood pressure 124/71, pulse 82, temperature 98.7 F (37.1 C), temperature source Oral, weight 195 lb (88.451 kg).  CBC    Component Value Date/Time   WBC 7.3 09/13/2014 1343   RBC 4.33 09/13/2014 1343   HGB 11.6* 09/13/2014 1343   HCT 35.4* 09/13/2014 1343   PLT 204 09/13/2014 1343   MCV 81.8 09/13/2014 1343   MCH 26.8 09/13/2014 1343   MCHC 32.8 09/13/2014 1343   RDW 15.0 09/13/2014 1343   LYMPHSABS 2.0 09/13/2014 1343   MONOABS 0.6 09/13/2014 1343   EOSABS 0.3 09/13/2014 1343   BASOSABS 0.1 09/13/2014 1343    BMET    Component Value Date/Time   NA 137 09/13/2014 1343   K 4.0 09/13/2014 1343   CL 104 09/13/2014 1343   CO2 22 09/13/2014 1343   GLUCOSE 109* 09/13/2014 1343   BUN 28* 09/13/2014 1343   CREATININE 1.33* 09/13/2014 1343   CREATININE 1.40* 08/14/2014 0605   CALCIUM 9.7 09/13/2014 1343   GFRNONAA 36* 08/14/2014 0605   GFRAA 42* 08/14/2014 0605    Erythrocyte Sedimentation Rate     Component Value Date/Time   ESRSEDRATE 27 09/13/2014 1343    C-Reactive Protein     Component Value Date/Time   CRP <0.5 09/13/2014 1343           Assessment & Plan:  MRSA infection of right tibia/fibula hardware: She is overall feeling well and her strength has been improving. She does not have any signs of infection. She has enough doxy to last until Sunday. She has been doing well enough that I feel she can now go off antibiotics and see how she does. She has follow up with ortho on 11/21/14. - Finish out Doxy then D/C - Will draw CRP, sed rate today  - Can F/U PRN

## 2014-11-02 LAB — SEDIMENTATION RATE: Sed Rate: 12 mm/hr (ref 0–30)
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
## 2015-08-11 ENCOUNTER — Other Ambulatory Visit: Payer: Self-pay | Admitting: Internal Medicine

## 2015-08-11 DIAGNOSIS — Z1231 Encounter for screening mammogram for malignant neoplasm of breast: Secondary | ICD-10-CM

## 2015-08-24 ENCOUNTER — Ambulatory Visit
Admission: RE | Admit: 2015-08-24 | Discharge: 2015-08-24 | Disposition: A | Discharge status: Home / Self Care | Payer: Medicare PPO | Source: Ambulatory Visit | Attending: Internal Medicine | Admitting: Internal Medicine

## 2015-08-24 DIAGNOSIS — Z1231 Encounter for screening mammogram for malignant neoplasm of breast: Secondary | ICD-10-CM

## 2016-04-06 IMAGING — CR DG CHEST 1V
1 series · 1 of 1 positions shown · non-contrast
Comparison: Chest x-ray from 04/30/2014.

CLINICAL DATA: Initial encounter for PICC line placement

EXAM:
CHEST  1 VIEW

[dg chest 1 view]
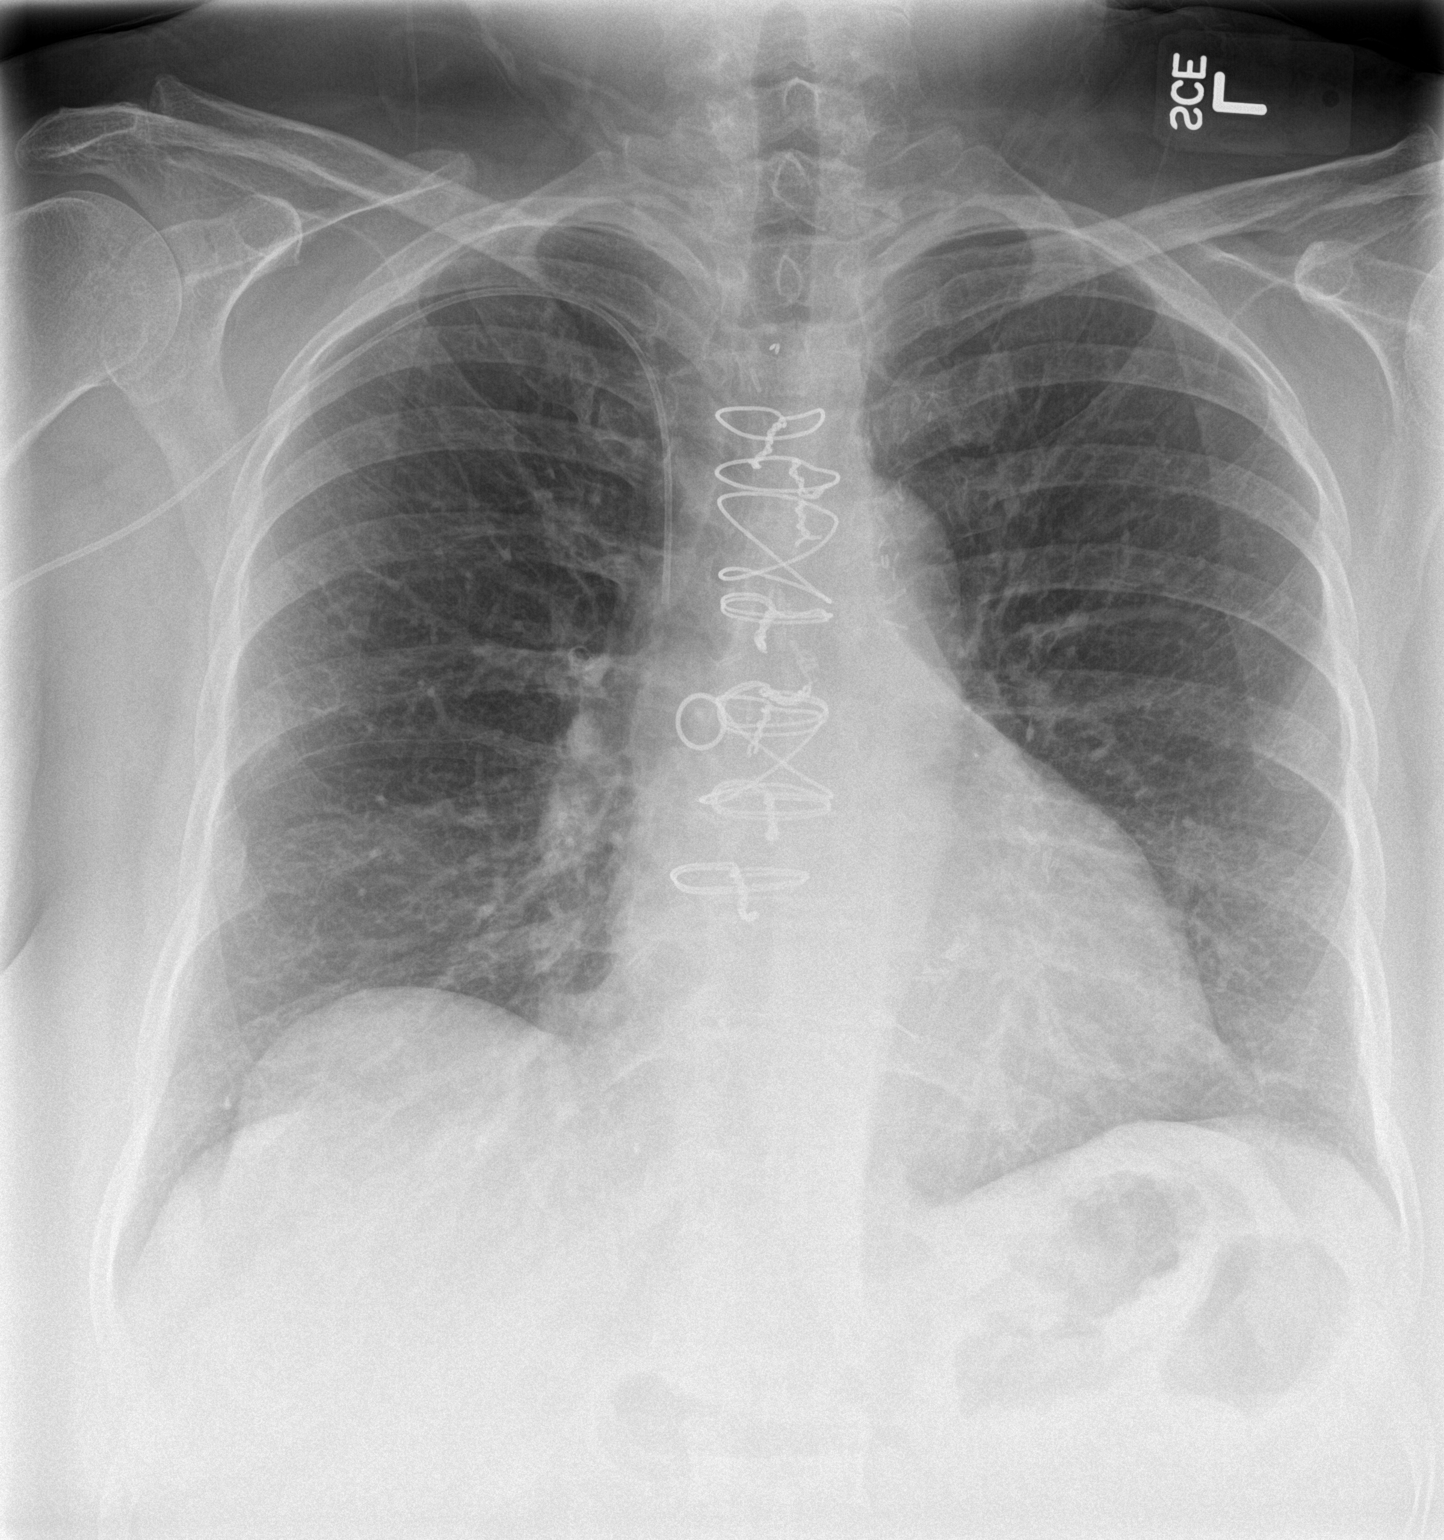

[1 of 1 positions shown; findings below may reference images not displayed]

FINDINGS: Right PICC line tip overlies the proximal SVC level. Lungs are clear
bilaterally. The cardiopericardial silhouette is within normal
limits for size. Imaged bony structures of the thorax are intact.
IMPRESSION: Right PICC line tip projects at the proximal SVC level.

## 2017-05-28 ENCOUNTER — Inpatient Hospital Stay: Payer: Medicare HMO | Attending: Internal Medicine | Admitting: Internal Medicine

## 2017-05-28 ENCOUNTER — Inpatient Hospital Stay: Payer: Medicare HMO

## 2017-05-28 ENCOUNTER — Encounter: Payer: Self-pay | Admitting: Internal Medicine

## 2017-05-28 VITALS — BP 128/66 | Temp 97.9°F | Resp 16 | Wt 204.9 lb

## 2017-05-28 DIAGNOSIS — E1136 Type 2 diabetes mellitus with diabetic cataract: Secondary | ICD-10-CM | POA: Diagnosis not present

## 2017-05-28 DIAGNOSIS — Z7982 Long term (current) use of aspirin: Secondary | ICD-10-CM | POA: Diagnosis not present

## 2017-05-28 DIAGNOSIS — N183 Chronic kidney disease, stage 3 unspecified: Secondary | ICD-10-CM

## 2017-05-28 DIAGNOSIS — Z79899 Other long term (current) drug therapy: Secondary | ICD-10-CM

## 2017-05-28 DIAGNOSIS — Z951 Presence of aortocoronary bypass graft: Secondary | ICD-10-CM | POA: Insufficient documentation

## 2017-05-28 DIAGNOSIS — E785 Hyperlipidemia, unspecified: Secondary | ICD-10-CM | POA: Diagnosis not present

## 2017-05-28 DIAGNOSIS — D631 Anemia in chronic kidney disease: Secondary | ICD-10-CM

## 2017-05-28 DIAGNOSIS — I2510 Atherosclerotic heart disease of native coronary artery without angina pectoris: Secondary | ICD-10-CM | POA: Insufficient documentation

## 2017-05-28 DIAGNOSIS — I129 Hypertensive chronic kidney disease with stage 1 through stage 4 chronic kidney disease, or unspecified chronic kidney disease: Secondary | ICD-10-CM

## 2017-05-28 DIAGNOSIS — F319 Bipolar disorder, unspecified: Secondary | ICD-10-CM | POA: Diagnosis not present

## 2017-05-28 DIAGNOSIS — E1122 Type 2 diabetes mellitus with diabetic chronic kidney disease: Secondary | ICD-10-CM | POA: Insufficient documentation

## 2017-05-28 DIAGNOSIS — M1990 Unspecified osteoarthritis, unspecified site: Secondary | ICD-10-CM | POA: Insufficient documentation

## 2017-05-28 LAB — COMPREHENSIVE METABOLIC PANEL
ALBUMIN: 4.3 g/dL (ref 3.5–5.0)
ALT: 11 U/L — ABNORMAL LOW (ref 14–54)
AST: 21 U/L (ref 15–41)
Alkaline Phosphatase: 66 U/L (ref 38–126)
Anion gap: 7 (ref 5–15)
BUN: 54 mg/dL — AB (ref 6–20)
CHLORIDE: 109 mmol/L (ref 101–111)
CO2: 23 mmol/L (ref 22–32)
Calcium: 9.7 mg/dL (ref 8.9–10.3)
Creatinine, Ser: 1.96 mg/dL — ABNORMAL HIGH (ref 0.44–1.00)
GFR calc Af Amer: 27 mL/min — ABNORMAL LOW (ref >60)
GFR calc non Af Amer: 23 mL/min — ABNORMAL LOW (ref >60)
GLUCOSE: 109 mg/dL — AB (ref 65–99)
POTASSIUM: 4.1 mmol/L (ref 3.5–5.1)
Sodium: 139 mmol/L (ref 135–145)
Total Bilirubin: 0.6 mg/dL (ref 0.3–1.2)
Total Protein: 7.4 g/dL (ref 6.5–8.1)

## 2017-05-28 LAB — FERRITIN: Ferritin: 41 ng/mL (ref 11–307)

## 2017-05-28 LAB — CBC WITH DIFFERENTIAL/PLATELET
BASOS ABS: 0.1 10*3/uL (ref 0–0.1)
BASOS PCT: 1 %
EOS PCT: 3 %
Eosinophils Absolute: 0.2 10*3/uL (ref 0–0.7)
HEMATOCRIT: 31.5 % — AB (ref 35.0–47.0)
Hemoglobin: 10.3 g/dL — ABNORMAL LOW (ref 12.0–16.0)
Lymphocytes Relative: 21 %
Lymphs Abs: 1.3 10*3/uL (ref 1.0–3.6)
MCH: 28.6 pg (ref 26.0–34.0)
MCHC: 32.7 g/dL (ref 32.0–36.0)
MCV: 87.5 fL (ref 80.0–100.0)
MONO ABS: 0.5 10*3/uL (ref 0.2–0.9)
Monocytes Relative: 9 %
NEUTROS ABS: 4.1 10*3/uL (ref 1.4–6.5)
Neutrophils Relative %: 66 %
PLATELETS: 144 10*3/uL — AB (ref 150–440)
RBC: 3.6 MIL/uL — ABNORMAL LOW (ref 3.80–5.20)
RDW: 14.2 % (ref 11.5–14.5)
WBC: 6.2 10*3/uL (ref 3.6–11.0)

## 2017-05-28 LAB — LACTATE DEHYDROGENASE: LDH: 172 U/L (ref 98–192)

## 2017-05-28 LAB — IRON AND TIBC
Iron: 37 ug/dL (ref 28–170)
SATURATION RATIOS: 10 % — AB (ref 10.4–31.8)
TIBC: 387 ug/dL (ref 250–450)
UIBC: 350 ug/dL

## 2017-05-28 LAB — TSH: TSH: 3.268 u[IU]/mL (ref 0.350–4.500)

## 2017-05-28 LAB — RETICULOCYTES
RBC.: 3.69 MIL/uL — ABNORMAL LOW (ref 3.80–5.20)
RETIC CT PCT: 1.2 % (ref 0.4–3.1)
Retic Count, Absolute: 44.3 10*3/uL (ref 19.0–183.0)

## 2017-05-28 LAB — FOLATE: Folate: 15.5 ng/mL (ref >5.9)

## 2017-05-28 LAB — VITAMIN B12: Vitamin B-12: 657 pg/mL (ref 180–914)

## 2017-05-28 NOTE — Assessment & Plan Note (Addendum)
#   Anemia normocytic symptomatic likely from CKD.  Long discussion the patient regarding the pathophysiology of anemia/chronic kidney disease.  Patient may be candidate for iron pills/IV iron/or Aranesp based upon the blood work and the severity of the anemia.  Discussed the possible iron infusion reactions-if needed.   # Check CBC CMP LDH iron studies ferritin kappa lambda light chain monoclonal workup W29 folic acid.  # Also discussed with patient that she will likely need colonoscopy-screening or diagnostic based upon of the workup.  Will again review at next visit  # fatigue-likely from symptomatic anemia. Check TSH.  # follow in [place] in 4 weeks/ or sooner; labs today. Will call with results.   Thank you Dr. for allowing me to participate in the care of your pleasant patient. Please do not hesitate to contact me with questions or concerns in the interim.  Dr.Lateef/Tejan-sie.

## 2017-05-28 NOTE — Progress Notes (Signed)
[name] NOTE  Patient Care Team: Jodi Marble, MD as PCP - General (Internal Medicine)  CHIEF COMPLAINTS/PURPOSE OF CONSULTATION: Anemia.   # ANEMIA- sec CKD/stage III-IV; no EGD/col  # CKD- III-IV [Dr.Lateef];   No history exists.     HISTORY OF PRESENTING ILLNESS:  [name] 77 y.o.  female with a history of chronic kidney disease attributed to diabetes hypertension -who follows up with Dr. Zollie Scale has been referred to [place] for further evaluation recommendations for worsening anemia.  Patient states that she had "low blood count for a long time"-she has been told to take p.o. iron in the past.  She is not on iron.  She never had blood in stools or black stools.  She never had colonoscopy or EGD.  Patient has been feeling tired for the last many months.  Gets short of breath on exertion.  Complains of chronic mild swelling in the legs.  Improving on diuretics.  Denies any headaches or vision changes.  Denies any weight loss or significant weight gain.  Patient has chronic mild arthritic pain not any worse.  ROS: A complete 10 point review of system is done which is negative except mentioned above in history of present illness  MEDICAL HISTORY:  Past Medical History:  Diagnosis Date  . Arthritis    "hands, feet, knees, shoulders" (05/05/2014)  . Bipolar disorder ([place])   . Cataracts, bilateral    immature  . Chronic kidney disease    "dr says they aren't functioning like they should" (05/05/2014)  . Coronary artery disease   . Depression    takes Zoloft daily  . History of bronchitis 15yrs ago  . Hyperlipidemia    takes Atorvastatin daily  . Hypertension    takes Metoprolol,Lisinopril,and Apresoline daily  . Joint pain   . Peripheral edema    takes Chlothalidone daily  . 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 Type II diabetes mellitus ([place])    takes Tradjenta daily    SURGICAL HISTORY: Past Surgical History:   Procedure Laterality Date  . CORONARY ARTERY BYPASS GRAFT  05/12/2007   "CABG X1; at [facility]"  . DILATION AND CURETTAGE OF UTERUS  2005  . FRACTURE SURGERY    . I&D EXTREMITY Right 04/30/2014   Procedure: IRRIGATION AND DEBRIDEMENT EXTREMITY;  Surgeon: Wylene Simmer, MD;  Location: [facility];  Service: Orthopedics;  Laterality: Right;  . INCISION AND DRAINAGE Right 08/11/2014   Procedure: REMOVAL OF DEEP IMPLANTS OF RIGHT TIBIAL FIBIAL INCISION AND DRAINAGE OF RIGHT ANKLE WOUND;  Surgeon: Wylene Simmer, MD;  Location: [facility];  Service: Orthopedics;  Laterality: Right;  . ORIF ANKLE FRACTURE Right 04/30/2014   Procedure: OPEN REDUCTION INTERNAL FIXATION (ORIF) ANKLE FRACTURE;  Surgeon: Wylene Simmer, MD;  Location: [facility];  Service: Orthopedics;  Laterality: Right;  . REMOVAL OF IMPLANT Right 08/11/2014   Procedure: REMOVAL OF DEEP IMPLANTS OF RIGHT TIBIAL FIBIAL INCISION AND DRAINAGE OF RIGHT ANKLE WOUND;  Surgeon: Wylene Simmer, MD;  Location: [facility];  Service: Orthopedics;  Laterality: Right;  . SQUAMOUS CELL CARCINOMA EXCISION Right 2012   "nose"  . 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 Self; wakls with walker- sec arthritis. Alone.  Social History   Socioeconomic History  . Marital status: Widowed    Spouse name: Not on file  . Number of children: Not on file  . Years  of education: Not on file  . Highest education level: Not on file  Social Needs  . Financial resource strain: Not on file  . Food insecurity - worry: Not on file  . Food insecurity - inability: Not on file  . Transportation needs - medical: Not on file  . Transportation needs - non-medical: Not on file  Occupational History  . Not on file  Tobacco Use  . Smoking status: Never Smoker  . Smokeless tobacco: Never Used  Substance and Sexual Activity  . Alcohol use: No    Alcohol/week: 0.0 oz  . Drug use: No  . Sexual activity: Not on file  Other Topics Concern  .  Not on file  Social History Narrative  . Not on file    FAMILY HISTORY: History reviewed. No pertinent family history.  ALLERGIES:  is allergic to codeine.  MEDICATIONS:  Current Outpatient Medications  Medication Sig Dispense Refill  . aspirin EC 325 MG tablet Take 325 mg by mouth daily.    Marland Kitchen atorvastatin (LIPITOR) 40 MG tablet Take 40 mg by mouth at bedtime.    . cetirizine (ZYRTEC) 10 MG tablet Take by mouth.    . chlorthalidone (HYGROTON) 25 MG tablet Take 25 mg by mouth daily.   2  . docusate sodium (COLACE) 100 MG capsule Take 1 capsule (100 mg total) by mouth 2 (two) times daily. 30 capsule 0  . doxycycline (VIBRA-TABS) 100 MG tablet Take 1 tablet (100 mg total) by mouth 2 (two) times daily. To take after IV antibiotics are done 60 tablet 1  . hydrALAZINE (APRESOLINE) 50 MG tablet Take 50 mg by mouth 3 (three) times daily.   2  . linagliptin (TRADJENTA) 5 MG TABS tablet Take 5 mg by mouth daily.    Marland Kitchen lisinopril (PRINIVIL,ZESTRIL) 40 MG tablet Take 40 mg by mouth daily.   2  . ondansetron (ZOFRAN) 4 MG tablet Take 1 tablet (4 mg total) by mouth every 12 (twelve) hours as needed for nausea or vomiting. 30 tablet 1  . oxyCODONE (OXY IR/ROXICODONE) 5 MG immediate release tablet Take 1 tablet (5 mg total) by mouth every 4 (four) hours as needed for moderate pain or severe pain. 30 tablet 0  . pioglitazone (ACTOS) 30 MG tablet Take by mouth.    . senna (SENOKOT) 8.6 MG TABS tablet Take 2 tablets (17.2 mg total) by mouth 2 (two) times daily. 60 each 0  . sertraline (ZOLOFT) 50 MG tablet Take 50 mg by mouth daily.     . traMADol-acetaminophen (ULTRACET) 37.5-325 MG per tablet Take 1 tablet by mouth every 6 (six) hours as needed.     No current facility-administered medications for this visit.       Marland Kitchen  PHYSICAL EXAMINATION: ECOG PERFORMANCE STATUS: 1 - Symptomatic but completely ambulatory  Vitals:   05/28/17 0918  BP: 128/66  Resp: 16  Temp: 97.9 F (36.6 C)   Filed  Weights   05/28/17 0918  Weight: 204 lb 14.4 oz (92.9 kg)    GENERAL: Well-nourished well-developed; Alert, no distress and comfortable.   Alone; walks with walker EYES: no pallor or icterus OROPHARYNX: no thrush or ulceration; good dentition  NECK: supple, no masses felt LYMPH:  no palpable lymphadenopathy in the cervical, axillary or inguinal regions LUNGS: clear to auscultation and  No wheeze or crackles HEART/CVS: regular rate & rhythm and no murmurs; bil 1-2 Bil lower extremity edema ABDOMEN: abdomen soft, non-tender and normal bowel sounds Musculoskeletal:no cyanosis of digits  and no clubbing  PSYCH: alert & oriented x 3 with fluent speech NEURO: no focal motor/sensory deficits SKIN:  no rashes or significant lesions  LABORATORY DATA:  I have reviewed the data as listed Lab Results  Component Value Date   WBC 7.3 09/13/2014   HGB 11.6 (L) 09/13/2014   HCT 35.4 (L) 09/13/2014   MCV 81.8 09/13/2014   PLT 204 09/13/2014   No results for input(s): NA, K, CL, CO2, GLUCOSE, BUN, CREATININE, CALCIUM, GFRNONAA, GFRAA, PROT, ALBUMIN, AST, ALT, ALKPHOS, BILITOT, BILIDIR, IBILI in the last 8760 hours.  RADIOGRAPHIC STUDIES: I have personally reviewed the radiological images as listed and agreed with the findings in the report. No results found.  ASSESSMENT & PLAN:   Anemia due to stage 3 chronic kidney disease ([place]) # Anemia normocytic symptomatic likely from CKD.  Long discussion the patient regarding the pathophysiology of anemia/chronic kidney disease.  Patient may be candidate for iron pills/IV iron/or Aranesp based upon the blood work and the severity of the anemia.  Discussed the possible iron infusion reactions-if needed.   # Check CBC CMP LDH iron studies ferritin kappa lambda light chain monoclonal workup F62 folic acid.  # Also discussed with patient that she will likely need colonoscopy-screening or diagnostic based upon of the workup.  Will again review at next  visit  # fatigue-likely from symptomatic anemia. Check TSH.  # follow in [place] in 4 weeks/ or sooner; labs today. Will call with results.   Thank you Dr. for allowing me to participate in the care of your pleasant patient. Please do not hesitate to contact me with questions or concerns in the interim.  Dr.Lateef/Tejan-sie.     All questions were answered. The patient knows to call the clinic with any problems, questions or concerns.       Cammie Sickle, MD 05/28/2017 10:08 AM

## 2017-05-29 LAB — KAPPA/LAMBDA LIGHT CHAINS
Kappa free light chain: 37.4 mg/L — ABNORMAL HIGH (ref 3.3–19.4)
Kappa, lambda light chain ratio: 1.5 (ref 0.26–1.65)
Lambda free light chains: 25 mg/L (ref 5.7–26.3)

## 2017-06-02 LAB — MULTIPLE MYELOMA PANEL, SERUM
Albumin SerPl Elph-Mcnc: 3.9 g/dL (ref 2.9–4.4)
Albumin/Glob SerPl: 1.4 (ref 0.7–1.7)
Alpha 1: 0.3 g/dL (ref 0.0–0.4)
Alpha2 Glob SerPl Elph-Mcnc: 0.7 g/dL (ref 0.4–1.0)
B-Globulin SerPl Elph-Mcnc: 1 g/dL (ref 0.7–1.3)
Gamma Glob SerPl Elph-Mcnc: 1 g/dL (ref 0.4–1.8)
Globulin, Total: 2.9 g/dL (ref 2.2–3.9)
IGM (IMMUNOGLOBULIN M), SRM: 115 mg/dL (ref 26–217)
IgA: 171 mg/dL (ref 64–422)
IgG (Immunoglobin G), Serum: 937 mg/dL (ref 700–1600)
Total Protein ELP: 6.8 g/dL (ref 6.0–8.5)

## 2017-06-24 ENCOUNTER — Encounter: Payer: Self-pay | Admitting: Internal Medicine

## 2017-06-24 ENCOUNTER — Other Ambulatory Visit: Payer: Self-pay

## 2017-06-24 ENCOUNTER — Inpatient Hospital Stay (HOSPITAL_BASED_OUTPATIENT_CLINIC_OR_DEPARTMENT_OTHER): Payer: Medicare HMO | Admitting: Internal Medicine

## 2017-06-24 ENCOUNTER — Inpatient Hospital Stay: Payer: Medicare HMO | Attending: Internal Medicine

## 2017-06-24 VITALS — BP 137/59 | HR 67 | Temp 97.7°F | Resp 22 | Ht 64.0 in | Wt 205.0 lb

## 2017-06-24 DIAGNOSIS — D631 Anemia in chronic kidney disease: Secondary | ICD-10-CM

## 2017-06-24 DIAGNOSIS — Z79899 Other long term (current) drug therapy: Secondary | ICD-10-CM | POA: Insufficient documentation

## 2017-06-24 DIAGNOSIS — M1990 Unspecified osteoarthritis, unspecified site: Secondary | ICD-10-CM | POA: Insufficient documentation

## 2017-06-24 DIAGNOSIS — E1122 Type 2 diabetes mellitus with diabetic chronic kidney disease: Secondary | ICD-10-CM | POA: Insufficient documentation

## 2017-06-24 DIAGNOSIS — N183 Chronic kidney disease, stage 3 unspecified: Secondary | ICD-10-CM

## 2017-06-24 DIAGNOSIS — I129 Hypertensive chronic kidney disease with stage 1 through stage 4 chronic kidney disease, or unspecified chronic kidney disease: Secondary | ICD-10-CM | POA: Diagnosis present

## 2017-06-24 DIAGNOSIS — E785 Hyperlipidemia, unspecified: Secondary | ICD-10-CM | POA: Insufficient documentation

## 2017-06-24 LAB — CBC WITH DIFFERENTIAL/PLATELET
Basophils Absolute: 0 10*3/uL (ref 0–0.1)
Basophils Relative: 1 %
EOS PCT: 4 %
Eosinophils Absolute: 0.3 10*3/uL (ref 0–0.7)
HEMATOCRIT: 32.7 % — AB (ref 35.0–47.0)
Hemoglobin: 11 g/dL — ABNORMAL LOW (ref 12.0–16.0)
LYMPHS ABS: 1.4 10*3/uL (ref 1.0–3.6)
Lymphocytes Relative: 21 %
MCH: 28.9 pg (ref 26.0–34.0)
MCHC: 33.5 g/dL (ref 32.0–36.0)
MCV: 86.3 fL (ref 80.0–100.0)
MONOS PCT: 8 %
Monocytes Absolute: 0.5 10*3/uL (ref 0.2–0.9)
Neutro Abs: 4.4 10*3/uL (ref 1.4–6.5)
Neutrophils Relative %: 66 %
Platelets: 172 10*3/uL (ref 150–440)
RBC: 3.8 MIL/uL (ref 3.80–5.20)
RDW: 14.1 % (ref 11.5–14.5)
WBC: 6.6 10*3/uL (ref 3.6–11.0)

## 2017-06-24 NOTE — Progress Notes (Signed)
[place] NOTE  Patient Care Team: Jodi Marble, MD as PCP - General (Internal Medicine)  CHIEF COMPLAINTS/PURPOSE OF CONSULTATION: Anemia.   # IRON DEF ANEMIA- sec CKD/stage III-IV; no EGD/col  # CKD- III-IV [Dr.Lateef]; Myeloma work up-NEG.    No history exists.     HISTORY OF PRESENTING ILLNESS:  [name] 77 y.o.  female CKD/anemia is here to review the results of her workup.  Patient continues to be fatigued.  Complains of shortness of breath with exertion.  She continues to deny any blood in stools or black colored stools.  Patient states that she had "low blood count for a long time"-she has been told to take p.o. iron in the past.  She is not on iron.  She never had blood in stools or black stools.    ROS: A complete 10 point review of system is done which is negative except mentioned above in history of present illness  MEDICAL HISTORY:  Past Medical History:  Diagnosis Date  . Arthritis    "hands, feet, knees, shoulders" (05/05/2014)  . Bipolar disorder ([place])   . Cataracts, bilateral    immature  . Chronic kidney disease    "dr says they aren't functioning like they should" (05/05/2014)  . Coronary artery disease   . Depression    takes Zoloft daily  . History of bronchitis 70yrs ago  . Hyperlipidemia    takes Atorvastatin daily  . Hypertension    takes Metoprolol,Lisinopril,and Apresoline daily  . Joint pain   . Peripheral edema    takes Chlothalidone daily  . 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 Type II diabetes mellitus ([place])    takes Tradjenta daily    SURGICAL HISTORY: Past Surgical History:  Procedure Laterality Date  . CORONARY ARTERY BYPASS GRAFT  05/12/2007   "CABG X1; at [facility]"  . DILATION AND CURETTAGE OF UTERUS  2005  . FRACTURE SURGERY    . I&D EXTREMITY Right 04/30/2014   Procedure: IRRIGATION AND DEBRIDEMENT EXTREMITY;  Surgeon: Wylene Simmer, MD;  Location: [place];  Service:  Orthopedics;  Laterality: Right;  . INCISION AND DRAINAGE Right 08/11/2014   Procedure: REMOVAL OF DEEP IMPLANTS OF RIGHT TIBIAL FIBIAL INCISION AND DRAINAGE OF RIGHT ANKLE WOUND;  Surgeon: Wylene Simmer, MD;  Location: [place];  Service: Orthopedics;  Laterality: Right;  . ORIF ANKLE FRACTURE Right 04/30/2014   Procedure: OPEN REDUCTION INTERNAL FIXATION (ORIF) ANKLE FRACTURE;  Surgeon: Wylene Simmer, MD;  Location: [place];  Service: Orthopedics;  Laterality: Right;  . REMOVAL OF IMPLANT Right 08/11/2014   Procedure: REMOVAL OF DEEP IMPLANTS OF RIGHT TIBIAL FIBIAL INCISION AND DRAINAGE OF RIGHT ANKLE WOUND;  Surgeon: Wylene Simmer, MD;  Location: [place];  Service: Orthopedics;  Laterality: Right;  . SQUAMOUS CELL CARCINOMA EXCISION Right 2012   "nose"  . 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 Self; wakls with walker- sec arthritis. Alone.  Social History   Socioeconomic History  . Marital status: Widowed    Spouse name: Not on file  . Number of children: Not on file  . Years of education: Not on file  . Highest education level: Not on file  Social Needs  . Financial resource strain: Not on file  . Food insecurity - worry: Not on file  . Food insecurity - inability: Not on file  . Transportation needs - medical:  Not on file  . Transportation needs - non-medical: Not on file  Occupational History  . Not on file  Tobacco Use  . Smoking status: Never Smoker  . Smokeless tobacco: Never Used  Substance and Sexual Activity  . Alcohol use: No    Alcohol/week: 0.0 oz  . Drug use: No  . Sexual activity: Not on file  Other Topics Concern  . Not on file  Social History Narrative  . Not on file    FAMILY HISTORY: History reviewed. No pertinent family history.  ALLERGIES:  is allergic to codeine.  MEDICATIONS:  Current Outpatient Medications  Medication Sig Dispense Refill  . aspirin EC 325 MG tablet Take 325 mg by mouth daily.     Marland Kitchen atorvastatin (LIPITOR) 40 MG tablet Take 40 mg by mouth at bedtime.    . cetirizine (ZYRTEC) 10 MG tablet Take by mouth.    . chlorthalidone (HYGROTON) 25 MG tablet Take 25 mg by mouth daily.   2  . docusate sodium (COLACE) 100 MG capsule Take 1 capsule (100 mg total) by mouth 2 (two) times daily. 30 capsule 0  . doxycycline (VIBRA-TABS) 100 MG tablet Take 1 tablet (100 mg total) by mouth 2 (two) times daily. To take after IV antibiotics are done 60 tablet 1  . hydrALAZINE (APRESOLINE) 50 MG tablet Take 50 mg by mouth 3 (three) times daily.   2  . linagliptin (TRADJENTA) 5 MG TABS tablet Take 5 mg by mouth daily.    Marland Kitchen lisinopril (PRINIVIL,ZESTRIL) 40 MG tablet Take 40 mg by mouth daily.   2  . ondansetron (ZOFRAN) 4 MG tablet Take 1 tablet (4 mg total) by mouth every 12 (twelve) hours as needed for nausea or vomiting. 30 tablet 1  . oxyCODONE (OXY IR/ROXICODONE) 5 MG immediate release tablet Take 1 tablet (5 mg total) by mouth every 4 (four) hours as needed for moderate pain or severe pain. 30 tablet 0  . pioglitazone (ACTOS) 30 MG tablet Take by mouth.    . senna (SENOKOT) 8.6 MG TABS tablet Take 2 tablets (17.2 mg total) by mouth 2 (two) times daily. 60 each 0  . sertraline (ZOLOFT) 50 MG tablet Take 50 mg by mouth daily.     . traMADol-acetaminophen (ULTRACET) 37.5-325 MG per tablet Take 1 tablet by mouth every 6 (six) hours as needed.     No current facility-administered medications for this visit.       Marland Kitchen  PHYSICAL EXAMINATION: ECOG PERFORMANCE STATUS: 1 - Symptomatic but completely ambulatory  Vitals:   06/24/17 1011  BP: (!) 137/59  Pulse: 67  Resp: (!) 22  Temp: 97.7 F (36.5 C)   Filed Weights   06/24/17 1011  Weight: 205 lb 0.4 oz (93 kg)    GENERAL: Well-nourished well-developed; Alert, no distress and comfortable.   Alone; walks with walker EYES: no pallor or icterus OROPHARYNX: no thrush or ulceration; good dentition  NECK: supple, no masses felt LYMPH:  no  palpable lymphadenopathy in the cervical, axillary or inguinal regions LUNGS: clear to auscultation and  No wheeze or crackles HEART/CVS: regular rate & rhythm and no murmurs; bil 1-2 Bil lower extremity edema ABDOMEN: abdomen soft, non-tender and normal bowel sounds Musculoskeletal:no cyanosis of digits and no clubbing  PSYCH: alert & oriented x 3 with fluent speech NEURO: no focal motor/sensory deficits SKIN:  no rashes or significant lesions  LABORATORY DATA:  I have reviewed the data as listed Lab Results  Component Value Date  WBC 6.6 06/24/2017   HGB 11.0 (L) 06/24/2017   HCT 32.7 (L) 06/24/2017   MCV 86.3 06/24/2017   PLT 172 06/24/2017   Recent Labs    05/28/17 1100  NA 139  K 4.1  CL 109  CO2 23  GLUCOSE 109*  BUN 54*  CREATININE 1.96*  CALCIUM 9.7  GFRNONAA 23*  GFRAA 27*  PROT 7.4  ALBUMIN 4.3  AST 21  ALT 11*  ALKPHOS 66  BILITOT 0.6    RADIOGRAPHIC STUDIES: I have personally reviewed the radiological images as listed and agreed with the findings in the report. No results found.  ASSESSMENT & PLAN:   Anemia due to stage 3 chronic kidney disease ([place]) # Anemia normocytic symptomatic likely from CKD. Hb- 9-10/ symptomatic with extreme fatigue.  Patient's iron saturation is 10%; ferritin 41.   # Long discussion the patient regarding the pathophysiology of anemia/chronic kidney disease/iron deficiency.  #Discussed regarding iron pills versus IV iron.  Patient is interested in IV iron; I think it is reasonable discussed the potential side effects of infusion reactions.  # recommend IV venofer weekly x4-if not improving hemoglobin not above 10 also discussed re: aranesp.   # colonoscopy-screening would be recommended-defer to PCP.   # IV venofer weekly x4; follow up in 2 months/labs/possible venofer.  Reviewed all labs with the patient in detail.  Dr.Lateef/Tejan-sie.    All questions were answered. The patient knows to call the clinic with any  problems, questions or concerns.     Cammie Sickle, MD 06/24/2017 1:10 PM

## 2017-06-24 NOTE — Assessment & Plan Note (Addendum)
#   Anemia normocytic symptomatic likely from CKD. Hb- 9-10/ symptomatic with extreme fatigue.  Patient's iron saturation is 10%; ferritin 41.   # Long discussion the patient regarding the pathophysiology of anemia/chronic kidney disease/iron deficiency.  #Discussed regarding iron pills versus IV iron.  Patient is interested in IV iron; I think it is reasonable discussed the potential side effects of infusion reactions.  # recommend IV venofer weekly x4-if not improving hemoglobin not above 10 also discussed re: aranesp.   # colonoscopy-screening would be recommended-defer to PCP.   # IV venofer weekly x4; follow up in 2 months/labs/possible venofer.  Reviewed all labs with the patient in detail.  Dr.Lateef/Tejan-sie.

## 2017-06-25 ENCOUNTER — Inpatient Hospital Stay: Payer: Medicare HMO

## 2017-06-25 VITALS — BP 133/69 | HR 68 | Temp 95.9°F | Resp 20

## 2017-06-25 DIAGNOSIS — I129 Hypertensive chronic kidney disease with stage 1 through stage 4 chronic kidney disease, or unspecified chronic kidney disease: Secondary | ICD-10-CM | POA: Diagnosis not present

## 2017-06-25 DIAGNOSIS — D631 Anemia in chronic kidney disease: Secondary | ICD-10-CM

## 2017-06-25 DIAGNOSIS — N183 Chronic kidney disease, stage 3 unspecified: Secondary | ICD-10-CM

## 2017-06-25 MED ORDER — SODIUM CHLORIDE 0.9 % IV SOLN
Freq: Once | INTRAVENOUS | Status: AC
  Administered 2017-06-25: 09:00:00 via INTRAVENOUS
  Filled 2017-06-25: qty 1000

## 2017-06-25 MED ORDER — IRON SUCROSE 20 MG/ML IV SOLN
200.0000 mg | Freq: Once | INTRAVENOUS | Status: AC
  Administered 2017-06-25: 200 mg via INTRAVENOUS
  Filled 2017-06-25: qty 10

## 2017-06-25 NOTE — Patient Instructions (Signed)
Iron Sucrose injection What is this medicine? IRON SUCROSE (AHY ern SOO krohs) is an iron complex. Iron is used to make healthy red blood cells, which carry oxygen and nutrients throughout the body. This medicine is used to treat iron deficiency anemia in people with chronic kidney disease. This medicine may be used for other purposes; ask your health care provider or pharmacist if you have questions. COMMON BRAND NAME(S): Venofer What should I tell my health care provider before I take this medicine? They need to know if you have any of these conditions: -anemia not caused by low iron levels -heart disease -high levels of iron in the blood -kidney disease -liver disease -an unusual or allergic reaction to iron, other medicines, foods, dyes, or preservatives -pregnant or trying to get pregnant -breast-feeding How should I use this medicine? This medicine is for infusion into a vein. It is given by a health care professional in a hospital or clinic setting. Talk to your pediatrician regarding the use of this medicine in children. While this drug may be prescribed for children as young as 2 years for selected conditions, precautions do apply. Overdosage: If you think you have taken too much of this medicine contact a poison control center or emergency room at once. NOTE: This medicine is only for you. Do not share this medicine with others. What if I miss a dose? It is important not to miss your dose. Call your doctor or health care professional if you are unable to keep an appointment. What may interact with this medicine? Do not take this medicine with any of the following medications: -deferoxamine -dimercaprol -other iron products This medicine may also interact with the following medications: -chloramphenicol -deferasirox This list may not describe all possible interactions. Give your health care provider a list of all the medicines, herbs, non-prescription drugs, or dietary  supplements you use. Also tell them if you smoke, drink alcohol, or use illegal drugs. Some items may interact with your medicine. What should I watch for while using this medicine? Visit your doctor or healthcare professional regularly. Tell your doctor or healthcare professional if your symptoms do not start to get better or if they get worse. You may need blood work done while you are taking this medicine. You may need to follow a special diet. Talk to your doctor. Foods that contain iron include: whole grains/cereals, dried fruits, beans, or peas, leafy green vegetables, and organ meats (liver, kidney). What side effects may I notice from receiving this medicine? Side effects that you should report to your doctor or health care professional as soon as possible: -allergic reactions like skin rash, itching or hives, swelling of the face, lips, or tongue -breathing problems -changes in blood pressure -cough -fast, irregular heartbeat -feeling faint or lightheaded, falls -fever or chills -flushing, sweating, or hot feelings -joint or muscle aches/pains -seizures -swelling of the ankles or feet -unusually weak or tired Side effects that usually do not require medical attention (report to your doctor or health care professional if they continue or are bothersome): -diarrhea -feeling achy -headache -irritation at site where injected -nausea, vomiting -stomach upset -tiredness This list may not describe all possible side effects. Call your doctor for medical advice about side effects. You may report side effects to FDA at 1-800-FDA-1088. Where should I keep my medicine? This drug is given in a hospital or clinic and will not be stored at home. NOTE: This sheet is a summary. It may not cover all possible information. If   you have questions about this medicine, talk to your doctor, pharmacist, or health care provider.  2018 Elsevier/Gold Standard (2011-01-10 17:14:35)  

## 2017-07-02 ENCOUNTER — Inpatient Hospital Stay: Payer: Medicare HMO

## 2017-07-02 VITALS — BP 115/71 | HR 59 | Temp 97.0°F | Resp 20

## 2017-07-02 DIAGNOSIS — I129 Hypertensive chronic kidney disease with stage 1 through stage 4 chronic kidney disease, or unspecified chronic kidney disease: Secondary | ICD-10-CM | POA: Diagnosis not present

## 2017-07-02 DIAGNOSIS — N183 Chronic kidney disease, stage 3 unspecified: Secondary | ICD-10-CM

## 2017-07-02 DIAGNOSIS — D631 Anemia in chronic kidney disease: Secondary | ICD-10-CM

## 2017-07-02 MED ORDER — IRON SUCROSE 20 MG/ML IV SOLN
200.0000 mg | Freq: Once | INTRAVENOUS | Status: AC
  Administered 2017-07-02: 200 mg via INTRAVENOUS
  Filled 2017-07-02: qty 10

## 2017-07-02 MED ORDER — IRON SUCROSE 20 MG/ML IV SOLN
INTRAVENOUS | Status: AC
  Filled 2017-07-02: qty 10

## 2017-07-02 MED ORDER — SODIUM CHLORIDE 0.9 % IV SOLN
Freq: Once | INTRAVENOUS | Status: AC
Start: 2017-07-02 — End: 2017-07-02
  Administered 2017-07-02: 09:00:00 via INTRAVENOUS
  Filled 2017-07-02: qty 1000

## 2017-07-02 NOTE — Patient Instructions (Signed)
Iron Sucrose injection What is this medicine? IRON SUCROSE (AHY ern SOO krohs) is an iron complex. Iron is used to make healthy red blood cells, which carry oxygen and nutrients throughout the body. This medicine is used to treat iron deficiency anemia in people with chronic kidney disease. This medicine may be used for other purposes; ask your health care provider or pharmacist if you have questions. COMMON BRAND NAME(S): Venofer What should I tell my health care provider before I take this medicine? They need to know if you have any of these conditions: -anemia not caused by low iron levels -heart disease -high levels of iron in the blood -kidney disease -liver disease -an unusual or allergic reaction to iron, other medicines, foods, dyes, or preservatives -pregnant or trying to get pregnant -breast-feeding How should I use this medicine? This medicine is for infusion into a vein. It is given by a health care professional in a hospital or clinic setting. Talk to your pediatrician regarding the use of this medicine in children. While this drug may be prescribed for children as young as 2 years for selected conditions, precautions do apply. Overdosage: If you think you have taken too much of this medicine contact a poison control center or emergency room at once. NOTE: This medicine is only for you. Do not share this medicine with others. What if I miss a dose? It is important not to miss your dose. Call your doctor or health care professional if you are unable to keep an appointment. What may interact with this medicine? Do not take this medicine with any of the following medications: -deferoxamine -dimercaprol -other iron products This medicine may also interact with the following medications: -chloramphenicol -deferasirox This list may not describe all possible interactions. Give your health care provider a list of all the medicines, herbs, non-prescription drugs, or dietary  supplements you use. Also tell them if you smoke, drink alcohol, or use illegal drugs. Some items may interact with your medicine. What should I watch for while using this medicine? Visit your doctor or healthcare professional regularly. Tell your doctor or healthcare professional if your symptoms do not start to get better or if they get worse. You may need blood work done while you are taking this medicine. You may need to follow a special diet. Talk to your doctor. Foods that contain iron include: whole grains/cereals, dried fruits, beans, or peas, leafy green vegetables, and organ meats (liver, kidney). What side effects may I notice from receiving this medicine? Side effects that you should report to your doctor or health care professional as soon as possible: -allergic reactions like skin rash, itching or hives, swelling of the face, lips, or tongue -breathing problems -changes in blood pressure -cough -fast, irregular heartbeat -feeling faint or lightheaded, falls -fever or chills -flushing, sweating, or hot feelings -joint or muscle aches/pains -seizures -swelling of the ankles or feet -unusually weak or tired Side effects that usually do not require medical attention (report to your doctor or health care professional if they continue or are bothersome): -diarrhea -feeling achy -headache -irritation at site where injected -nausea, vomiting -stomach upset -tiredness This list may not describe all possible side effects. Call your doctor for medical advice about side effects. You may report side effects to FDA at 1-800-FDA-1088. Where should I keep my medicine? This drug is given in a hospital or clinic and will not be stored at home. NOTE: This sheet is a summary. It may not cover all possible information. If   you have questions about this medicine, talk to your doctor, pharmacist, or health care provider.  2018 Elsevier/Gold Standard (2011-01-10 17:14:35)  

## 2017-07-09 ENCOUNTER — Inpatient Hospital Stay: Payer: Medicare HMO

## 2017-07-09 VITALS — BP 111/59 | HR 64 | Temp 97.0°F | Resp 20

## 2017-07-09 DIAGNOSIS — I129 Hypertensive chronic kidney disease with stage 1 through stage 4 chronic kidney disease, or unspecified chronic kidney disease: Secondary | ICD-10-CM | POA: Diagnosis not present

## 2017-07-09 DIAGNOSIS — N183 Chronic kidney disease, stage 3 (moderate): Principal | ICD-10-CM

## 2017-07-09 DIAGNOSIS — D631 Anemia in chronic kidney disease: Secondary | ICD-10-CM

## 2017-07-09 MED ORDER — SODIUM CHLORIDE 0.9 % IV SOLN
Freq: Once | INTRAVENOUS | Status: AC
  Administered 2017-07-09: 14:00:00 via INTRAVENOUS
  Filled 2017-07-09: qty 1000

## 2017-07-09 MED ORDER — IRON SUCROSE 20 MG/ML IV SOLN
200.0000 mg | Freq: Once | INTRAVENOUS | Status: AC
  Administered 2017-07-09: 200 mg via INTRAVENOUS
  Filled 2017-07-09: qty 10

## 2017-07-09 MED ORDER — IRON SUCROSE 20 MG/ML IV SOLN
INTRAVENOUS | Status: AC
Start: 2017-07-09 — End: ?
  Filled 2017-07-09: qty 10

## 2017-07-09 NOTE — Patient Instructions (Signed)
Iron Sucrose injection What is this medicine? IRON SUCROSE (AHY ern SOO krohs) is an iron complex. Iron is used to make healthy red blood cells, which carry oxygen and nutrients throughout the body. This medicine is used to treat iron deficiency anemia in people with chronic kidney disease. This medicine may be used for other purposes; ask your health care provider or pharmacist if you have questions. COMMON BRAND NAME(S): Venofer What should I tell my health care provider before I take this medicine? They need to know if you have any of these conditions: -anemia not caused by low iron levels -heart disease -high levels of iron in the blood -kidney disease -liver disease -an unusual or allergic reaction to iron, other medicines, foods, dyes, or preservatives -pregnant or trying to get pregnant -breast-feeding How should I use this medicine? This medicine is for infusion into a vein. It is given by a health care professional in a hospital or clinic setting. Talk to your pediatrician regarding the use of this medicine in children. While this drug may be prescribed for children as young as 2 years for selected conditions, precautions do apply. Overdosage: If you think you have taken too much of this medicine contact a poison control center or emergency room at once. NOTE: This medicine is only for you. Do not share this medicine with others. What if I miss a dose? It is important not to miss your dose. Call your doctor or health care professional if you are unable to keep an appointment. What may interact with this medicine? Do not take this medicine with any of the following medications: -deferoxamine -dimercaprol -other iron products This medicine may also interact with the following medications: -chloramphenicol -deferasirox This list may not describe all possible interactions. Give your health care provider a list of all the medicines, herbs, non-prescription drugs, or dietary  supplements you use. Also tell them if you smoke, drink alcohol, or use illegal drugs. Some items may interact with your medicine. What should I watch for while using this medicine? Visit your doctor or healthcare professional regularly. Tell your doctor or healthcare professional if your symptoms do not start to get better or if they get worse. You may need blood work done while you are taking this medicine. You may need to follow a special diet. Talk to your doctor. Foods that contain iron include: whole grains/cereals, dried fruits, beans, or peas, leafy green vegetables, and organ meats (liver, kidney). What side effects may I notice from receiving this medicine? Side effects that you should report to your doctor or health care professional as soon as possible: -allergic reactions like skin rash, itching or hives, swelling of the face, lips, or tongue -breathing problems -changes in blood pressure -cough -fast, irregular heartbeat -feeling faint or lightheaded, falls -fever or chills -flushing, sweating, or hot feelings -joint or muscle aches/pains -seizures -swelling of the ankles or feet -unusually weak or tired Side effects that usually do not require medical attention (report to your doctor or health care professional if they continue or are bothersome): -diarrhea -feeling achy -headache -irritation at site where injected -nausea, vomiting -stomach upset -tiredness This list may not describe all possible side effects. Call your doctor for medical advice about side effects. You may report side effects to FDA at 1-800-FDA-1088. Where should I keep my medicine? This drug is given in a hospital or clinic and will not be stored at home. NOTE: This sheet is a summary. It may not cover all possible information. If   you have questions about this medicine, talk to your doctor, pharmacist, or health care provider.  2018 Elsevier/Gold Standard (2011-01-10 17:14:35)  

## 2017-07-16 ENCOUNTER — Inpatient Hospital Stay: Payer: Medicare HMO | Attending: Internal Medicine

## 2017-07-16 VITALS — BP 118/70 | HR 64 | Temp 97.2°F | Resp 20

## 2017-07-16 DIAGNOSIS — M1990 Unspecified osteoarthritis, unspecified site: Secondary | ICD-10-CM | POA: Diagnosis not present

## 2017-07-16 DIAGNOSIS — D631 Anemia in chronic kidney disease: Secondary | ICD-10-CM | POA: Diagnosis not present

## 2017-07-16 DIAGNOSIS — I129 Hypertensive chronic kidney disease with stage 1 through stage 4 chronic kidney disease, or unspecified chronic kidney disease: Secondary | ICD-10-CM | POA: Diagnosis not present

## 2017-07-16 DIAGNOSIS — Z79899 Other long term (current) drug therapy: Secondary | ICD-10-CM | POA: Diagnosis not present

## 2017-07-16 DIAGNOSIS — E1122 Type 2 diabetes mellitus with diabetic chronic kidney disease: Secondary | ICD-10-CM | POA: Insufficient documentation

## 2017-07-16 DIAGNOSIS — E785 Hyperlipidemia, unspecified: Secondary | ICD-10-CM | POA: Insufficient documentation

## 2017-07-16 DIAGNOSIS — N183 Chronic kidney disease, stage 3 (moderate): Secondary | ICD-10-CM | POA: Diagnosis not present

## 2017-07-16 MED ORDER — IRON SUCROSE 20 MG/ML IV SOLN
200.0000 mg | Freq: Once | INTRAVENOUS | Status: AC
  Administered 2017-07-16: 200 mg via INTRAVENOUS
  Filled 2017-07-16: qty 10

## 2017-07-16 MED ORDER — SODIUM CHLORIDE 0.9 % IV SOLN
Freq: Once | INTRAVENOUS | Status: AC
  Administered 2017-07-16: 14:00:00 via INTRAVENOUS
  Filled 2017-07-16: qty 1000

## 2017-08-19 ENCOUNTER — Inpatient Hospital Stay: Payer: Medicare HMO

## 2017-08-19 ENCOUNTER — Inpatient Hospital Stay: Payer: Medicare HMO | Attending: Internal Medicine | Admitting: Internal Medicine

## 2017-08-19 VITALS — BP 124/68 | HR 66 | Resp 16

## 2017-08-19 VITALS — BP 101/58 | HR 70 | Temp 98.5°F | Resp 16 | Wt 208.8 lb

## 2017-08-19 DIAGNOSIS — D509 Iron deficiency anemia, unspecified: Secondary | ICD-10-CM | POA: Insufficient documentation

## 2017-08-19 DIAGNOSIS — D631 Anemia in chronic kidney disease: Secondary | ICD-10-CM

## 2017-08-19 DIAGNOSIS — N183 Chronic kidney disease, stage 3 unspecified: Secondary | ICD-10-CM

## 2017-08-19 LAB — CBC WITH DIFFERENTIAL/PLATELET
Basophils Absolute: 0.1 10*3/uL (ref 0–0.1)
Basophils Relative: 1 %
Eosinophils Absolute: 0.2 10*3/uL (ref 0–0.7)
Eosinophils Relative: 3 %
HEMATOCRIT: 33.5 % — AB (ref 35.0–47.0)
HEMOGLOBIN: 11 g/dL — AB (ref 12.0–16.0)
LYMPHS ABS: 1.7 10*3/uL (ref 1.0–3.6)
Lymphocytes Relative: 24 %
MCH: 28.4 pg (ref 26.0–34.0)
MCHC: 32.9 g/dL (ref 32.0–36.0)
MCV: 86.4 fL (ref 80.0–100.0)
MONOS PCT: 9 %
Monocytes Absolute: 0.6 10*3/uL (ref 0.2–0.9)
NEUTROS ABS: 4.4 10*3/uL (ref 1.4–6.5)
NEUTROS PCT: 63 %
Platelets: 163 10*3/uL (ref 150–440)
RBC: 3.88 MIL/uL (ref 3.80–5.20)
RDW: 14.2 % (ref 11.5–14.5)
WBC: 7 10*3/uL (ref 3.6–11.0)

## 2017-08-19 LAB — BASIC METABOLIC PANEL
ANION GAP: 10 (ref 5–15)
BUN: 46 mg/dL — ABNORMAL HIGH (ref 6–20)
CHLORIDE: 106 mmol/L (ref 101–111)
CO2: 23 mmol/L (ref 22–32)
Calcium: 9.5 mg/dL (ref 8.9–10.3)
Creatinine, Ser: 1.73 mg/dL — ABNORMAL HIGH (ref 0.44–1.00)
GFR calc non Af Amer: 27 mL/min — ABNORMAL LOW (ref >60)
GFR, EST AFRICAN AMERICAN: 32 mL/min — AB (ref >60)
Glucose, Bld: 94 mg/dL (ref 65–99)
POTASSIUM: 3.9 mmol/L (ref 3.5–5.1)
SODIUM: 139 mmol/L (ref 135–145)

## 2017-08-19 MED ORDER — SODIUM CHLORIDE 0.9 % IV SOLN
Freq: Once | INTRAVENOUS | Status: AC
  Administered 2017-08-19: 12:00:00 via INTRAVENOUS
  Filled 2017-08-19: qty 1000

## 2017-08-19 MED ORDER — IRON SUCROSE 20 MG/ML IV SOLN
200.0000 mg | Freq: Once | INTRAVENOUS | Status: AC
  Administered 2017-08-19: 200 mg via INTRAVENOUS
  Filled 2017-08-19: qty 10

## 2017-08-19 NOTE — Patient Instructions (Signed)
Iron Sucrose injection What is this medicine? IRON SUCROSE (AHY ern SOO krohs) is an iron complex. Iron is used to make healthy red blood cells, which carry oxygen and nutrients throughout the body. This medicine is used to treat iron deficiency anemia in people with chronic kidney disease. This medicine may be used for other purposes; ask your health care provider or pharmacist if you have questions. COMMON BRAND NAME(S): Venofer What should I tell my health care provider before I take this medicine? They need to know if you have any of these conditions: -anemia not caused by low iron levels -heart disease -high levels of iron in the blood -kidney disease -liver disease -an unusual or allergic reaction to iron, other medicines, foods, dyes, or preservatives -pregnant or trying to get pregnant -breast-feeding How should I use this medicine? This medicine is for infusion into a vein. It is given by a health care professional in a hospital or clinic setting. Talk to your pediatrician regarding the use of this medicine in children. While this drug may be prescribed for children as young as 2 years for selected conditions, precautions do apply. Overdosage: If you think you have taken too much of this medicine contact a poison control center or emergency room at once. NOTE: This medicine is only for you. Do not share this medicine with others. What if I miss a dose? It is important not to miss your dose. Call your doctor or health care professional if you are unable to keep an appointment. What may interact with this medicine? Do not take this medicine with any of the following medications: -deferoxamine -dimercaprol -other iron products This medicine may also interact with the following medications: -chloramphenicol -deferasirox This list may not describe all possible interactions. Give your health care provider a list of all the medicines, herbs, non-prescription drugs, or dietary  supplements you use. Also tell them if you smoke, drink alcohol, or use illegal drugs. Some items may interact with your medicine. What should I watch for while using this medicine? Visit your doctor or healthcare professional regularly. Tell your doctor or healthcare professional if your symptoms do not start to get better or if they get worse. You may need blood work done while you are taking this medicine. You may need to follow a special diet. Talk to your doctor. Foods that contain iron include: whole grains/cereals, dried fruits, beans, or peas, leafy green vegetables, and organ meats (liver, kidney). What side effects may I notice from receiving this medicine? Side effects that you should report to your doctor or health care professional as soon as possible: -allergic reactions like skin rash, itching or hives, swelling of the face, lips, or tongue -breathing problems -changes in blood pressure -cough -fast, irregular heartbeat -feeling faint or lightheaded, falls -fever or chills -flushing, sweating, or hot feelings -joint or muscle aches/pains -seizures -swelling of the ankles or feet -unusually weak or tired Side effects that usually do not require medical attention (report to your doctor or health care professional if they continue or are bothersome): -diarrhea -feeling achy -headache -irritation at site where injected -nausea, vomiting -stomach upset -tiredness This list may not describe all possible side effects. Call your doctor for medical advice about side effects. You may report side effects to FDA at 1-800-FDA-1088. Where should I keep my medicine? This drug is given in a hospital or clinic and will not be stored at home. NOTE: This sheet is a summary. It may not cover all possible information. If   you have questions about this medicine, talk to your doctor, pharmacist, or health care provider.  2018 Elsevier/Gold Standard (2011-01-10 17:14:35)  

## 2017-08-19 NOTE — Assessment & Plan Note (Addendum)
#  Anemia/iron deficiency chronic kidney disease-based on iron saturation 10%/hemoglobin 9-10; status post IV iron improved  # s/p IV iron- Hb improved 11; proceed with Iv iron today. dsicussed re: iron rich food/ and alos PO iron. Hold aranesp.   # colonoscopy-screening would be recommended-defer to PCP.   # follow up in 4 months/labs-IV venofer/ labs- cbc/iorn studies.   Dr.Lateef/Tejan-sie.

## 2017-08-19 NOTE — Progress Notes (Signed)
[place] NOTE  Patient Care Team: Jodi Marble, MD as PCP - General (Internal Medicine)  CHIEF COMPLAINTS/PURPOSE OF CONSULTATION: Anemia.   # IRON DEF ANEMIA- sec CKD/stage III-IV; no EGD/col  # CKD- III-IV [Dr.Lateef]; Myeloma work up-NEG.    No history exists.     HISTORY OF PRESENTING ILLNESS:  EVALISE [name] 77 y.o.  female iron deficiency anemia/CKD is here for follow-up.  Patient status post IV Venofer x4; her symptoms of fatigue improved.  Shortness of breath is improved.  She is not currently on iron pills.  No chest pain or shortness of breath or cough.  Awaiting to see her PCP regarding screening colonoscopy.  Review of Systems  Constitutional: Negative for chills, diaphoresis, fever, malaise/fatigue and weight loss.  HENT: Negative for nosebleeds and sore throat.   Eyes: Negative for double vision.  Respiratory: Negative for cough, hemoptysis, sputum production, shortness of breath and wheezing.   Cardiovascular: Negative for chest pain, palpitations, orthopnea and leg swelling.  Gastrointestinal: Negative for abdominal pain, blood in stool, constipation, diarrhea, heartburn, melena, nausea and vomiting.  Genitourinary: Negative for dysuria, frequency and urgency.  Musculoskeletal: Negative for back pain and joint pain.  Skin: Negative.  Negative for itching and rash.  Neurological: Negative for dizziness, tingling, focal weakness, weakness and headaches.  Endo/Heme/Allergies: Does not bruise/bleed easily.  Psychiatric/Behavioral: Negative for depression. The patient is not nervous/anxious and does not have insomnia.      MEDICAL HISTORY:  Past Medical History:  Diagnosis Date  . Arthritis    "hands, feet, knees, shoulders" (05/05/2014)  . Bipolar disorder ([place])   . Cataracts, bilateral    immature  . Chronic kidney disease    "dr says they aren't functioning like they should" (05/05/2014)  . Coronary artery disease   .  Depression    takes Zoloft daily  . History of bronchitis 50yrs ago  . Hyperlipidemia    takes Atorvastatin daily  . Hypertension    takes Metoprolol,Lisinopril,and Apresoline daily  . Joint pain   . Peripheral edema    takes Chlothalidone daily  . 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 Type II diabetes mellitus ([place])    takes Tradjenta daily    SURGICAL HISTORY: Past Surgical History:  Procedure Laterality Date  . CORONARY ARTERY BYPASS GRAFT  05/12/2007   "[facility]G X1; at [facility]"  . DILATION AND CURETTAGE OF UTERUS  2005  . FRACTURE SURGERY    . I&D EXTREMITY Right 04/30/2014   Procedure: IRRIGATION AND DEBRIDEMENT EXTREMITY;  Surgeon: Wylene Simmer, MD;  Location: [place];  Service: Orthopedics;  Laterality: Right;  . INCISION AND DRAINAGE Right 08/11/2014   Procedure: REMOVAL OF DEEP IMPLANTS OF RIGHT TIBIAL FIBIAL INCISION AND DRAINAGE OF RIGHT ANKLE WOUND;  Surgeon: Wylene Simmer, MD;  Location: [place];  Service: Orthopedics;  Laterality: Right;  . ORIF ANKLE FRACTURE Right 04/30/2014   Procedure: OPEN REDUCTION INTERNAL FIXATION (ORIF) ANKLE FRACTURE;  Surgeon: Wylene Simmer, MD;  Location: [place];  Service: Orthopedics;  Laterality: Right;  . REMOVAL OF IMPLANT Right 08/11/2014   Procedure: REMOVAL OF DEEP IMPLANTS OF RIGHT TIBIAL FIBIAL INCISION AND DRAINAGE OF RIGHT ANKLE WOUND;  Surgeon: Wylene Simmer, MD;  Location: [place];  Service: Orthopedics;  Laterality: Right;  . SQUAMOUS CELL CARCINOMA EXCISION Right 2012   "nose"  . 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 Alone.  Social History   Socioeconomic History  . Marital status: Widowed    Spouse name: Not on file  . Number of children: Not on file  . Years of education: Not on file  . Highest education level: Not on file  Occupational History  . Not on file  Social Needs  . Financial resource strain: Not on file   . Food insecurity:    Worry: Not on file    Inability: Not on file  . Transportation needs:    Medical: Not on file    Non-medical: Not on file  Tobacco Use  . Smoking status: Never Smoker  . Smokeless tobacco: Never Used  Substance and Sexual Activity  . Alcohol use: No    Alcohol/week: 0.0 oz  . Drug use: No  . Sexual activity: Not on file  Lifestyle  . Physical activity:    Days per week: Not on file    Minutes per session: Not on file  . Stress: Not on file  Relationships  . Social connections:    Talks on phone: Not on file    Gets together: Not on file    Attends religious service: Not on file    Active member of club or organization: Not on file    Attends meetings of clubs or organizations: Not on file    Relationship status: Not on file  . Intimate partner violence:    Fear of current or ex partner: Not on file    Emotionally abused: Not on file    Physically abused: Not on file    Forced sexual activity: Not on file  Other Topics Concern  . Not on file  Social History Narrative  . Not on file    FAMILY HISTORY: No family history on file.  ALLERGIES:  is allergic to codeine.  MEDICATIONS:  Current Outpatient Medications  Medication Sig Dispense Refill  . aspirin EC 325 MG tablet Take 325 mg by mouth daily.    Marland Kitchen atorvastatin (LIPITOR) 40 MG tablet Take 40 mg by mouth at bedtime.    . cetirizine (ZYRTEC) 10 MG tablet Take by mouth.    . chlorthalidone (HYGROTON) 25 MG tablet Take 25 mg by mouth daily.   2  . docusate sodium (COLACE) 100 MG capsule Take 1 capsule (100 mg total) by mouth 2 (two) times daily. 30 capsule 0  . doxycycline (VIBRA-TABS) 100 MG tablet Take 1 tablet (100 mg total) by mouth 2 (two) times daily. To take after IV antibiotics are done 60 tablet 1  . hydrALAZINE (APRESOLINE) 50 MG tablet Take 50 mg by mouth 3 (three) times daily.   2  . linagliptin (TRADJENTA) 5 MG TABS tablet Take 5 mg by mouth daily.    Marland Kitchen lisinopril  (PRINIVIL,ZESTRIL) 40 MG tablet Take 40 mg by mouth daily.   2  . oxyCODONE (OXY IR/ROXICODONE) 5 MG immediate release tablet Take 1 tablet (5 mg total) by mouth every 4 (four) hours as needed for moderate pain or severe pain. 30 tablet 0  . pioglitazone (ACTOS) 30 MG tablet Take by mouth.    . senna (SENOKOT) 8.6 MG TABS tablet Take 2 tablets (17.2 mg total) by mouth 2 (two) times daily. 60 each 0  . sertraline (ZOLOFT) 50 MG tablet Take 50 mg by mouth daily.     . traMADol-acetaminophen (ULTRACET) 37.5-325 MG per tablet Take 1 tablet by mouth every 6 (six) hours as needed.    Marland Kitchen  ondansetron (ZOFRAN) 4 MG tablet Take 1 tablet (4 mg total) by mouth every 12 (twelve) hours as needed for nausea or vomiting. (Patient not taking: Reported on 08/19/2017) 30 tablet 1   No current facility-administered medications for this visit.       Marland Kitchen  PHYSICAL EXAMINATION: ECOG PERFORMANCE STATUS: 1 - Symptomatic but completely ambulatory  Vitals:   08/19/17 1045  BP: (!) 101/58  Pulse: 70  Resp: 16  Temp: 98.5 F (36.9 C)   Filed Weights   08/19/17 1045  Weight: 208 lb 12.4 oz (94.7 kg)    GENERAL: Well-nourished well-developed; Alert, no distress and comfortable.  Alone walks with a walker. EYES: no pallor or icterus OROPHARYNX: no thrush or ulceration; NECK: supple; no lymph nodes felt. LYMPH:  no palpable lymphadenopathy in the axillary or inguinal regions LUNGS: Decreased breath sounds auscultation bilaterally. No wheeze or crackles HEART/CVS: regular rate & rhythm and no murmurs; No lower extremity edema ABDOMEN:abdomen soft, non-tender and normal bowel sounds. No hepatomegaly or splenomegaly.  Musculoskeletal:no cyanosis of digits and no clubbing  PSYCH: alert & oriented x 3 with fluent speech NEURO: no focal motor/sensory deficits SKIN:  no rashes or significant lesions  LABORATORY DATA:  I have reviewed the data as listed Lab Results  Component Value Date   WBC 7.0 08/19/2017   HGB  11.0 (L) 08/19/2017   HCT 33.5 (L) 08/19/2017   MCV 86.4 08/19/2017   PLT 163 08/19/2017   Recent Labs    05/28/17 1100 08/19/17 1015  NA 139 139  K 4.1 3.9  CL 109 106  CO2 23 23  GLUCOSE 109* 94  BUN 54* 46*  CREATININE 1.96* 1.73*  CALCIUM 9.7 9.5  GFRNONAA 23* 27*  GFRAA 27* 32*  PROT 7.4  --   ALBUMIN 4.3  --   AST 21  --   ALT 11*  --   ALKPHOS 66  --   BILITOT 0.6  --     RADIOGRAPHIC STUDIES: I have personally reviewed the radiological images as listed and agreed with the findings in the report. No results found.  ASSESSMENT & PLAN:   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) #Anemia/iron deficiency chronic kidney disease-based on iron saturation 10%/hemoglobin 9-10; status post IV iron improved  # s/p IV iron- Hb improved 11; proceed with Iv iron today. dsicussed re: iron rich food/ and alos PO iron. Hold aranesp.   # colonoscopy-screening would be recommended-defer to PCP.   # follow up in 4 months/labs-IV venofer/ labs- cbc/iorn studies.   Dr.Lateef/Tejan-sie.   All questions were answered. The patient knows to call the clinic with any problems, questions or concerns.    Cammie Sickle, MD 08/19/2017 12:03 PM

## 2017-12-19 ENCOUNTER — Inpatient Hospital Stay: Payer: Medicare HMO | Attending: Internal Medicine

## 2017-12-19 DIAGNOSIS — D509 Iron deficiency anemia, unspecified: Secondary | ICD-10-CM | POA: Insufficient documentation

## 2017-12-19 DIAGNOSIS — I129 Hypertensive chronic kidney disease with stage 1 through stage 4 chronic kidney disease, or unspecified chronic kidney disease: Secondary | ICD-10-CM | POA: Diagnosis not present

## 2017-12-19 DIAGNOSIS — N183 Chronic kidney disease, stage 3 (moderate): Secondary | ICD-10-CM | POA: Diagnosis present

## 2017-12-19 DIAGNOSIS — D631 Anemia in chronic kidney disease: Secondary | ICD-10-CM

## 2017-12-19 DIAGNOSIS — E1122 Type 2 diabetes mellitus with diabetic chronic kidney disease: Secondary | ICD-10-CM | POA: Diagnosis not present

## 2017-12-19 LAB — CBC WITH DIFFERENTIAL/PLATELET
Basophils Absolute: 0 10*3/uL (ref 0–0.1)
Basophils Relative: 1 %
EOS ABS: 0.2 10*3/uL (ref 0–0.7)
Eosinophils Relative: 3 %
HCT: 35.6 % (ref 35.0–47.0)
HEMOGLOBIN: 11.9 g/dL — AB (ref 12.0–16.0)
LYMPHS ABS: 1.4 10*3/uL (ref 1.0–3.6)
Lymphocytes Relative: 26 %
MCH: 29.6 pg (ref 26.0–34.0)
MCHC: 33.4 g/dL (ref 32.0–36.0)
MCV: 88.8 fL (ref 80.0–100.0)
Monocytes Absolute: 0.5 10*3/uL (ref 0.2–0.9)
Monocytes Relative: 9 %
NEUTROS PCT: 61 %
Neutro Abs: 3.3 10*3/uL (ref 1.4–6.5)
Platelets: 153 10*3/uL (ref 150–440)
RBC: 4.02 MIL/uL (ref 3.80–5.20)
RDW: 13.3 % (ref 11.5–14.5)
WBC: 5.4 10*3/uL (ref 3.6–11.0)

## 2017-12-19 LAB — COMPREHENSIVE METABOLIC PANEL
ALT: 15 U/L (ref 0–44)
ANION GAP: 9 (ref 5–15)
AST: 23 U/L (ref 15–41)
Albumin: 4.2 g/dL (ref 3.5–5.0)
Alkaline Phosphatase: 76 U/L (ref 38–126)
BUN: 33 mg/dL — ABNORMAL HIGH (ref 8–23)
CHLORIDE: 112 mmol/L — AB (ref 98–111)
CO2: 22 mmol/L (ref 22–32)
CREATININE: 1.52 mg/dL — AB (ref 0.44–1.00)
Calcium: 9.9 mg/dL (ref 8.9–10.3)
GFR, EST AFRICAN AMERICAN: 37 mL/min — AB (ref >60)
GFR, EST NON AFRICAN AMERICAN: 32 mL/min — AB (ref >60)
Glucose, Bld: 105 mg/dL — ABNORMAL HIGH (ref 70–99)
Potassium: 4.6 mmol/L (ref 3.5–5.1)
Sodium: 143 mmol/L (ref 135–145)
Total Bilirubin: 0.5 mg/dL (ref 0.3–1.2)
Total Protein: 7.4 g/dL (ref 6.5–8.1)

## 2017-12-19 LAB — IRON AND TIBC
IRON: 71 ug/dL (ref 28–170)
SATURATION RATIOS: 22 % (ref 10.4–31.8)
TIBC: 319 ug/dL (ref 250–450)
UIBC: 248 ug/dL

## 2017-12-19 LAB — FERRITIN: FERRITIN: 179 ng/mL (ref 11–307)

## 2017-12-23 ENCOUNTER — Inpatient Hospital Stay: Payer: Medicare HMO | Admitting: Internal Medicine

## 2017-12-23 ENCOUNTER — Other Ambulatory Visit: Payer: Self-pay

## 2017-12-23 ENCOUNTER — Encounter: Payer: Self-pay | Admitting: Internal Medicine

## 2017-12-23 ENCOUNTER — Inpatient Hospital Stay: Payer: Medicare HMO

## 2017-12-23 VITALS — BP 128/66 | HR 54 | Resp 18

## 2017-12-23 VITALS — BP 133/77 | HR 66 | Temp 97.6°F | Resp 18 | Wt 205.0 lb

## 2017-12-23 DIAGNOSIS — E1122 Type 2 diabetes mellitus with diabetic chronic kidney disease: Secondary | ICD-10-CM | POA: Diagnosis not present

## 2017-12-23 DIAGNOSIS — D509 Iron deficiency anemia, unspecified: Secondary | ICD-10-CM

## 2017-12-23 DIAGNOSIS — N183 Chronic kidney disease, stage 3 (moderate): Secondary | ICD-10-CM | POA: Diagnosis not present

## 2017-12-23 DIAGNOSIS — I129 Hypertensive chronic kidney disease with stage 1 through stage 4 chronic kidney disease, or unspecified chronic kidney disease: Secondary | ICD-10-CM

## 2017-12-23 DIAGNOSIS — D631 Anemia in chronic kidney disease: Secondary | ICD-10-CM

## 2017-12-23 MED ORDER — SODIUM CHLORIDE 0.9 % IV SOLN
Freq: Once | INTRAVENOUS | Status: AC
  Administered 2017-12-23: 10:00:00 via INTRAVENOUS
  Filled 2017-12-23: qty 250

## 2017-12-23 MED ORDER — IRON SUCROSE 20 MG/ML IV SOLN
200.0000 mg | Freq: Once | INTRAVENOUS | Status: AC
  Administered 2017-12-23: 200 mg via INTRAVENOUS
  Filled 2017-12-23: qty 10

## 2017-12-23 MED ORDER — IRON SUCROSE 20 MG/ML IV SOLN: 200.0000 mg | Freq: Once | INTRAVENOUS | Status: DC

## 2017-12-23 MED ORDER — SODIUM CHLORIDE 0.9 % IV SOLN: 200.0000 mg | Freq: Once | INTRAVENOUS | Status: DC

## 2017-12-23 NOTE — Progress Notes (Signed)
[place] NOTE  Patient Care Team: Jodi Marble, MD as PCP - General (Internal Medicine)  CHIEF COMPLAINTS/PURPOSE OF CONSULTATION: Anemia.   # IRON DEF ANEMIA- sec CKD/stage III-IV; EGD/col in oct 2019 [[facility]]  # CKD- III-IV [Dr.Lateef]; Myeloma work up-NEG.    No history exists.     HISTORY OF PRESENTING ILLNESS:  [name] 77 y.o.  female iron deficiency anemia/CKD is here for follow-up.  Patient had IV iron approximately 4 months ago-noted to have improvement of her hemoglobin around 11.  She continues to be on p.o. Iron; she is compliant on iron rich foods.  No new shortness of breath or cough.  Review of Systems  Constitutional: Negative for chills, diaphoresis, fever, malaise/fatigue and weight loss.  HENT: Negative for nosebleeds and sore throat.   Eyes: Negative for double vision.  Respiratory: Negative for cough, hemoptysis, sputum production, shortness of breath and wheezing.   Cardiovascular: Negative for chest pain, palpitations, orthopnea and leg swelling.  Gastrointestinal: Negative for abdominal pain, blood in stool, constipation, diarrhea, heartburn, melena, nausea and vomiting.  Genitourinary: Negative for dysuria, frequency and urgency.  Musculoskeletal: Negative for back pain and joint pain.  Skin: Negative.  Negative for itching and rash.  Neurological: Negative for dizziness, tingling, focal weakness, weakness and headaches.  Endo/Heme/Allergies: Does not bruise/bleed easily.  Psychiatric/Behavioral: Negative for depression. The patient is not nervous/anxious and does not have insomnia.      MEDICAL HISTORY:  Past Medical History:  Diagnosis Date  . Arthritis    "hands, feet, knees, shoulders" (05/05/2014)  . Bipolar disorder ([place])   . Cataracts, bilateral    immature  . Chronic kidney disease    "dr says they aren't functioning like they should" (05/05/2014)  . Coronary artery disease   . Depression    takes  Zoloft daily  . History of bronchitis 54yrs ago  . Hyperlipidemia    takes Atorvastatin daily  . Hypertension    takes Metoprolol,Lisinopril,and Apresoline daily  . Joint pain   . Peripheral edema    takes Chlothalidone daily  . 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 Type II diabetes mellitus ([place])    takes Tradjenta daily    SURGICAL HISTORY: Past Surgical History:  Procedure Laterality Date  . CORONARY ARTERY BYPASS GRAFT  05/12/2007   "C[facility]G X1; at [facility]"  . DILATION AND CURETTAGE OF UTERUS  2005  . FRACTURE SURGERY    . I&D EXTREMITY Right 04/30/2014   Procedure: IRRIGATION AND DEBRIDEMENT EXTREMITY;  Surgeon: Wylene Simmer, MD;  Location: [facility];  Service: Orthopedics;  Laterality: Right;  . INCISION AND DRAINAGE Right 08/11/2014   Procedure: REMOVAL OF DEEP IMPLANTS OF RIGHT TIBIAL FIBIAL INCISION AND DRAINAGE OF RIGHT ANKLE WOUND;  Surgeon: Wylene Simmer, MD;  Location: [facility];  Service: Orthopedics;  Laterality: Right;  . ORIF ANKLE FRACTURE Right 04/30/2014   Procedure: OPEN REDUCTION INTERNAL FIXATION (ORIF) ANKLE FRACTURE;  Surgeon: Wylene Simmer, MD;  Location: [facility];  Service: Orthopedics;  Laterality: Right;  . REMOVAL OF IMPLANT Right 08/11/2014   Procedure: REMOVAL OF DEEP IMPLANTS OF RIGHT TIBIAL FIBIAL INCISION AND DRAINAGE OF RIGHT ANKLE WOUND;  Surgeon: Wylene Simmer, MD;  Location: [facility];  Service: Orthopedics;  Laterality: Right;  . SQUAMOUS CELL CARCINOMA EXCISION Right 2012   "nose"  . 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 Self; wakls with  walker- sec arthritis. Alone.  Social History   Socioeconomic History  . Marital status: Widowed    Spouse name: Not on file  . Number of children: Not on file  . Years of education: Not on file  . Highest education level: Not on file  Occupational History  . Not on file  Social Needs  . Financial resource strain: Not on file  . Food insecurity:     Worry: Not on file    Inability: Not on file  . Transportation needs:    Medical: Not on file    Non-medical: Not on file  Tobacco Use  . Smoking status: Never Smoker  . Smokeless tobacco: Never Used  Substance and Sexual Activity  . Alcohol use: No    Alcohol/week: 0.0 standard drinks  . Drug use: No  . Sexual activity: Not on file  Lifestyle  . Physical activity:    Days per week: Not on file    Minutes per session: Not on file  . Stress: Not on file  Relationships  . Social connections:    Talks on phone: Not on file    Gets together: Not on file    Attends religious service: Not on file    Active member of club or organization: Not on file    Attends meetings of clubs or organizations: Not on file    Relationship status: Not on file  . Intimate partner violence:    Fear of current or ex partner: Not on file    Emotionally abused: Not on file    Physically abused: Not on file    Forced sexual activity: Not on file  Other Topics Concern  . Not on file  Social History Narrative  . Not on file    FAMILY HISTORY: History reviewed. No pertinent family history.  ALLERGIES:  is allergic to codeine.  MEDICATIONS:  Current Outpatient Medications  Medication Sig Dispense Refill  . aspirin EC 325 MG tablet Take 325 mg by mouth daily.    Marland Kitchen atorvastatin (LIPITOR) 40 MG tablet Take 40 mg by mouth at bedtime.    . cetirizine (ZYRTEC) 10 MG tablet Take by mouth.    . chlorthalidone (HYGROTON) 25 MG tablet Take 25 mg by mouth daily.   2  . Cholecalciferol (VITAMIN D3) 5000 units TABS Take 1 tablet by mouth daily.    . ferrous sulfate 325 (65 FE) MG tablet Take 1 tablet by mouth daily.    . hydrALAZINE (APRESOLINE) 50 MG tablet Take 50 mg by mouth 3 (three) times daily.   2  . lisinopril (PRINIVIL,ZESTRIL) 40 MG tablet Take 40 mg by mouth daily.   2  . sertraline (ZOLOFT) 50 MG tablet Take 50 mg by mouth daily.     . traMADol-acetaminophen (ULTRACET) 37.5-325 MG per tablet  Take 1 tablet by mouth every 6 (six) hours as needed.     No current facility-administered medications for this visit.    Facility-Administered Medications Ordered in Other Visits  Medication Dose Route Frequency Provider Last Rate Last Dose  . iron sucrose (VENOFER) injection 200 mg  200 mg Intravenous Once Charlaine Dalton R, MD   200 mg at 12/23/17 1037      .  PHYSICAL EXAMINATION: ECOG PERFORMANCE STATUS: 1 - Symptomatic but completely ambulatory  Vitals:   12/23/17 1000  BP: 133/77  Pulse: 66  Resp: 18  Temp: 97.6 F (36.4 C)   Filed Weights   12/23/17 0958  Weight: 205 lb 0.4  oz (93 kg)   Physical Exam  Constitutional: She is oriented to person, place, and time and well-developed, well-nourished, and in no distress.   Alone. walks with a walker.  Obese.  HENT:  Head: Normocephalic and atraumatic.  Mouth/Throat: Oropharynx is clear and moist. No oropharyngeal exudate.  Eyes: Pupils are equal, round, and reactive to light.  Neck: Normal range of motion. Neck supple.  Cardiovascular: Normal rate and regular rhythm.  Pulmonary/Chest: No respiratory distress. She has no wheezes.  Abdominal: Soft. Bowel sounds are normal. She exhibits no distension and no mass. There is no tenderness. There is no rebound and no guarding.  Musculoskeletal: Normal range of motion. She exhibits no edema or tenderness.  Neurological: She is alert and oriented to person, place, and time.  Skin: Skin is warm.  Psychiatric: Affect normal.     LABORATORY DATA:  I have reviewed the data as listed Lab Results  Component Value Date   WBC 5.4 12/19/2017   HGB 11.9 (L) 12/19/2017   HCT 35.6 12/19/2017   MCV 88.8 12/19/2017   PLT 153 12/19/2017   Recent Labs    05/28/17 1100 08/19/17 1015 12/19/17 1006  NA 139 139 143  K 4.1 3.9 4.6  CL 109 106 112*  CO2 23 23 22   GLUCOSE 109* 94 105*  BUN 54* 46* 33*  CREATININE 1.96* 1.73* 1.52*  CALCIUM 9.7 9.5 9.9  GFRNONAA 23* 27* 32*   GFRAA 27* 32* 37*  PROT 7.4  --  7.4  ALBUMIN 4.3  --  4.2  AST 21  --  23  ALT 11*  --  15  ALKPHOS 66  --  76  BILITOT 0.6  --  0.5    RADIOGRAPHIC STUDIES: I have personally reviewed the radiological images as listed and agreed with the findings in the report. No results found.  ASSESSMENT & PLAN:   Anemia due to stage 3 chronic kidney disease ([place]) # Anemia/iron deficiency chronic kidney disease-status post IV iron improved  # s/p IV iron- Hb improved 11.9; proceed with Iv iron today.  Continue iron rich food/ and also PO iron.  Continue to hold Aranesp.  # colonoscopy-screening 22nd Oct 2019/[facility].   # follow up in 6 months/labs-IV venofer/ labs- cbc/iorn studies.   Dr.Lateef/Tejan-sie.   All questions were answered. The patient knows to call the clinic with any problems, questions or concerns.    Cammie Sickle, MD 12/23/2017 10:40 AM

## 2017-12-23 NOTE — Patient Instructions (Signed)
Iron Sucrose injection What is this medicine? IRON SUCROSE (AHY ern SOO krohs) is an iron complex. Iron is used to make healthy red blood cells, which carry oxygen and nutrients throughout the body. This medicine is used to treat iron deficiency anemia in people with chronic kidney disease. This medicine may be used for other purposes; ask your health care provider or pharmacist if you have questions. COMMON BRAND NAME(S): Venofer What should I tell my health care provider before I take this medicine? They need to know if you have any of these conditions: -anemia not caused by low iron levels -heart disease -high levels of iron in the blood -kidney disease -liver disease -an unusual or allergic reaction to iron, other medicines, foods, dyes, or preservatives -pregnant or trying to get pregnant -breast-feeding How should I use this medicine? This medicine is for infusion into a vein. It is given by a health care professional in a hospital or clinic setting. Talk to your pediatrician regarding the use of this medicine in children. While this drug may be prescribed for children as young as 2 years for selected conditions, precautions do apply. Overdosage: If you think you have taken too much of this medicine contact a poison control center or emergency room at once. NOTE: This medicine is only for you. Do not share this medicine with others. What if I miss a dose? It is important not to miss your dose. Call your doctor or health care professional if you are unable to keep an appointment. What may interact with this medicine? Do not take this medicine with any of the following medications: -deferoxamine -dimercaprol -other iron products This medicine may also interact with the following medications: -chloramphenicol -deferasirox This list may not describe all possible interactions. Give your health care provider a list of all the medicines, herbs, non-prescription drugs, or dietary  supplements you use. Also tell them if you smoke, drink alcohol, or use illegal drugs. Some items may interact with your medicine. What should I watch for while using this medicine? Visit your doctor or healthcare professional regularly. Tell your doctor or healthcare professional if your symptoms do not start to get better or if they get worse. You may need blood work done while you are taking this medicine. You may need to follow a special diet. Talk to your doctor. Foods that contain iron include: whole grains/cereals, dried fruits, beans, or peas, leafy green vegetables, and organ meats (liver, kidney). What side effects may I notice from receiving this medicine? Side effects that you should report to your doctor or health care professional as soon as possible: -allergic reactions like skin rash, itching or hives, swelling of the face, lips, or tongue -breathing problems -changes in blood pressure -cough -fast, irregular heartbeat -feeling faint or lightheaded, falls -fever or chills -flushing, sweating, or hot feelings -joint or muscle aches/pains -seizures -swelling of the ankles or feet -unusually weak or tired Side effects that usually do not require medical attention (report to your doctor or health care professional if they continue or are bothersome): -diarrhea -feeling achy -headache -irritation at site where injected -nausea, vomiting -stomach upset -tiredness This list may not describe all possible side effects. Call your doctor for medical advice about side effects. You may report side effects to FDA at 1-800-FDA-1088. Where should I keep my medicine? This drug is given in a hospital or clinic and will not be stored at home. NOTE: This sheet is a summary. It may not cover all possible information. If   you have questions about this medicine, talk to your doctor, pharmacist, or health care provider.  2018 Elsevier/Gold Standard (2011-01-10 17:14:35)  

## 2017-12-23 NOTE — Assessment & Plan Note (Addendum)
#   Anemia/iron deficiency chronic kidney disease-status post IV iron improved  # s/p IV iron- Hb improved 11.9; proceed with Iv iron today.  Continue iron rich food/ and also PO iron.  Continue to hold Aranesp.  # colonoscopy-screening 22nd Oct 2019/[facility].   # follow up in 6 months/labs-IV venofer/ labs- cbc/iorn studies.   Dr.Lateef/Tejan-sie.

## 2018-02-02 ENCOUNTER — Encounter: Payer: Self-pay | Admitting: *Deleted

## 2018-02-03 ENCOUNTER — Encounter
Admission: RE | Disposition: A | Discharge status: Home / Self Care | Payer: Self-pay | Source: Ambulatory Visit | Attending: Gastroenterology

## 2018-02-03 ENCOUNTER — Ambulatory Visit: Payer: Medicare HMO | Admitting: Anesthesiology

## 2018-02-03 ENCOUNTER — Ambulatory Visit
Admission: RE | Admit: 2018-02-03 | Discharge: 2018-02-03 | Disposition: A | Discharge status: Home / Self Care | Payer: Medicare HMO | Source: Ambulatory Visit | Attending: Gastroenterology | Admitting: Gastroenterology

## 2018-02-03 DIAGNOSIS — D125 Benign neoplasm of sigmoid colon: Secondary | ICD-10-CM | POA: Insufficient documentation

## 2018-02-03 DIAGNOSIS — D124 Benign neoplasm of descending colon: Secondary | ICD-10-CM | POA: Insufficient documentation

## 2018-02-03 DIAGNOSIS — Z1211 Encounter for screening for malignant neoplasm of colon: Secondary | ICD-10-CM | POA: Diagnosis not present

## 2018-02-03 DIAGNOSIS — N189 Chronic kidney disease, unspecified: Secondary | ICD-10-CM | POA: Insufficient documentation

## 2018-02-03 DIAGNOSIS — K5730 Diverticulosis of large intestine without perforation or abscess without bleeding: Secondary | ICD-10-CM | POA: Diagnosis not present

## 2018-02-03 DIAGNOSIS — E1122 Type 2 diabetes mellitus with diabetic chronic kidney disease: Secondary | ICD-10-CM | POA: Diagnosis not present

## 2018-02-03 DIAGNOSIS — Z7982 Long term (current) use of aspirin: Secondary | ICD-10-CM | POA: Insufficient documentation

## 2018-02-03 DIAGNOSIS — F319 Bipolar disorder, unspecified: Secondary | ICD-10-CM | POA: Diagnosis not present

## 2018-02-03 DIAGNOSIS — I2510 Atherosclerotic heart disease of native coronary artery without angina pectoris: Secondary | ICD-10-CM | POA: Diagnosis not present

## 2018-02-03 DIAGNOSIS — K644 Residual hemorrhoidal skin tags: Secondary | ICD-10-CM | POA: Diagnosis not present

## 2018-02-03 DIAGNOSIS — Z79899 Other long term (current) drug therapy: Secondary | ICD-10-CM | POA: Diagnosis not present

## 2018-02-03 DIAGNOSIS — I129 Hypertensive chronic kidney disease with stage 1 through stage 4 chronic kidney disease, or unspecified chronic kidney disease: Secondary | ICD-10-CM | POA: Insufficient documentation

## 2018-02-03 DIAGNOSIS — E785 Hyperlipidemia, unspecified: Secondary | ICD-10-CM | POA: Insufficient documentation

## 2018-02-03 DIAGNOSIS — Z7984 Long term (current) use of oral hypoglycemic drugs: Secondary | ICD-10-CM | POA: Diagnosis not present

## 2018-02-03 DIAGNOSIS — K6389 Other specified diseases of intestine: Secondary | ICD-10-CM | POA: Diagnosis not present

## 2018-02-03 DIAGNOSIS — K641 Second degree hemorrhoids: Secondary | ICD-10-CM | POA: Diagnosis not present

## 2018-02-03 HISTORY — PX: COLONOSCOPY WITH PROPOFOL: SHX5780

## 2018-02-03 SURGERY — COLONOSCOPY WITH PROPOFOL
Anesthesia: General

## 2018-02-03 MED ORDER — EPHEDRINE SULFATE 50 MG/ML IJ SOLN
INTRAMUSCULAR | Status: DC | PRN
  Administered 2018-02-03: 10 mg via INTRAVENOUS

## 2018-02-03 MED ORDER — PROPOFOL 500 MG/50ML IV EMUL
INTRAVENOUS | Status: AC
  Filled 2018-02-03: qty 50

## 2018-02-03 MED ORDER — PROPOFOL 500 MG/50ML IV EMUL
INTRAVENOUS | Status: DC | PRN
  Administered 2018-02-03: 160 ug/kg/min via INTRAVENOUS

## 2018-02-03 MED ORDER — LIDOCAINE 2% (20 MG/ML) 5 ML SYRINGE
INTRAMUSCULAR | Status: DC | PRN
  Administered 2018-02-03: 30 mg via INTRAVENOUS

## 2018-02-03 MED ORDER — PHENYLEPHRINE HCL 10 MG/ML IJ SOLN
INTRAMUSCULAR | Status: DC | PRN
  Administered 2018-02-03: 100 ug via INTRAVENOUS

## 2018-02-03 MED ORDER — FENTANYL CITRATE (PF) 100 MCG/2ML IJ SOLN
INTRAMUSCULAR | Status: AC
  Filled 2018-02-03: qty 2

## 2018-02-03 MED ORDER — FENTANYL CITRATE (PF) 100 MCG/2ML IJ SOLN
INTRAMUSCULAR | Status: DC | PRN
  Administered 2018-02-03 (×2): 50 ug via INTRAVENOUS

## 2018-02-03 MED ORDER — SODIUM CHLORIDE 0.9 % IV SOLN
INTRAVENOUS | Status: DC
  Administered 2018-02-03: 1000 mL via INTRAVENOUS

## 2018-02-03 MED ORDER — PROPOFOL 10 MG/ML IV BOLUS
INTRAVENOUS | Status: DC | PRN
  Administered 2018-02-03: 100 mg via INTRAVENOUS

## 2018-02-03 NOTE — Anesthesia Preprocedure Evaluation (Signed)
Anesthesia Evaluation  Patient identified by MRN, date of birth, ID band Patient awake    Reviewed: Allergy & Precautions, NPO status , Patient's Chart, lab work & pertinent test results  History of Anesthesia Complications Negative for: history of anesthetic complications  Airway Mallampati: II       Dental   Pulmonary neg sleep apnea, neg COPD,           Cardiovascular hypertension, Pt. on medications + CABG  (-) Past MI and (-) CHF (-) dysrhythmias (-) Valvular Problems/Murmurs     Neuro/Psych neg Seizures Depression Bipolar Disorder    GI/Hepatic Neg liver ROS, neg GERD  ,  Endo/Other  diabetes, Type 2, Oral Hypoglycemic Agents  Renal/GU Renal InsufficiencyRenal disease     Musculoskeletal   Abdominal   Peds  Hematology  (+) anemia ,   Anesthesia Other Findings   Reproductive/Obstetrics                             Anesthesia Physical Anesthesia Plan  ASA: III  Anesthesia Plan:    Post-op Pain Management:    Induction:   PONV Risk Score and Plan: 2 and Propofol infusion and TIVA  Airway Management Planned: Nasal Cannula  Additional Equipment:   Intra-op Plan:   Post-operative Plan:   Informed Consent: I have reviewed the patients History and Physical, chart, labs and discussed the procedure including the risks, benefits and alternatives for the proposed anesthesia with the patient or authorized representative who has indicated his/her understanding and acceptance.     Plan Discussed with:   Anesthesia Plan Comments:         Anesthesia Quick Evaluation

## 2018-02-03 NOTE — Anesthesia Postprocedure Evaluation (Signed)
Anesthesia Post Note  Patient: [name]  Procedure(s) Performed: COLONOSCOPY WITH PROPOFOL (N/A )  Patient location during evaluation: Endoscopy Anesthesia Type: General Level of consciousness: awake and alert Pain management: pain level controlled Vital Signs Assessment: post-procedure vital signs reviewed and stable Respiratory status: spontaneous breathing and respiratory function stable Cardiovascular status: stable Anesthetic complications: no     Last Vitals:  Vitals:   02/03/18 1250 02/03/18 1300  BP: (!) 124/54 120/80  Pulse: 83 90  Resp: 16 17  Temp:    SpO2: 95% 97%    Last Pain:  Vitals:   02/03/18 1300  TempSrc:   PainSc: 0-No pain                 Ethylene Reznick K

## 2018-02-03 NOTE — H&P (Signed)
Outpatient short stay form Pre-procedure 02/03/2018 11:43 AM Suzanne Sails MD  Primary Physician:  Dr Pernell Dupre  Reason for visit: Colonoscopy  History of present illness: Patient is a 77 year old female presenting today for colonoscopy.  This is actually her first colonoscopy.  There is no rectal bleeding abdominal pain or diarrhea.  Tolerating her prep well.  She has held her 325 mg aspirin.  Takes no other aspirin products or blood thinning agent.    Current Facility-Administered Medications:  .  0.9 %  sodium chloride infusion, , Intravenous, Continuous, Suzanne Sails, MD, Last Rate: 20 mL/hr at 02/03/18 1132  Medications Prior to Admission  Medication Sig Dispense Refill Last Dose  . amLODipine (NORVASC) 10 MG tablet Take 10 mg by mouth daily.   02/03/2018 at 0600  . aspirin EC 325 MG tablet Take 325 mg by mouth daily.   Past Week at Unknown time  . atorvastatin (LIPITOR) 40 MG tablet Take 40 mg by mouth at bedtime.   Past Week at Unknown time  . cetirizine (ZYRTEC) 10 MG tablet Take by mouth.   Past Week at Unknown time  . chlorthalidone (HYGROTON) 25 MG tablet Take 25 mg by mouth daily.   2 Past Week at Unknown time  . Cholecalciferol (VITAMIN D3) 5000 units TABS Take 1 tablet by mouth daily.   Past Week at Unknown time  . ferrous sulfate 325 (65 FE) MG tablet Take 1 tablet by mouth daily.   Past Week at Unknown time  . fluticasone (FLONASE) 50 MCG/ACT nasal spray Place 1 spray into both nostrils daily.   Past Week at Unknown time  . hydrALAZINE (APRESOLINE) 50 MG tablet Take 50 mg by mouth 3 (three) times daily.   2 Past Week at Unknown time  . lisinopril (PRINIVIL,ZESTRIL) 40 MG tablet Take 40 mg by mouth daily.   2 Past Week at Unknown time  . sertraline (ZOLOFT) 50 MG tablet Take 50 mg by mouth daily.    Past Week at Unknown time  . traMADol-acetaminophen (ULTRACET) 37.5-325 MG per tablet Take 1 tablet by mouth every 6 (six) hours as needed.   Past Week at  Unknown time     Allergies  Allergen Reactions  . Codeine Nausea And Vomiting    "Makes me deathly sick"     Past Medical History:  Diagnosis Date  . Arthritis    "hands, feet, knees, shoulders" (05/05/2014)  . Bipolar disorder ([place])   . Cataracts, bilateral    immature  . Chronic kidney disease    "dr says they aren't functioning like they should" (05/05/2014)  . Coronary artery disease   . Depression    takes Zoloft daily  . History of bronchitis 15yrs ago  . Hyperlipidemia    takes Atorvastatin daily  . Hypertension    takes Metoprolol,Lisinopril,and Apresoline daily  . Joint pain   . Peripheral edema    takes Chlothalidone daily  . 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 Squamous cell cancer of skin of nose   . Type II diabetes mellitus ([place])    takes Tradjenta daily    Review of systems:      Physical Exam    Heart and lungs: Regular rate and rhythm without rub or gallop, lungs are bilaterally clear.    HEENT: Normocephalic atraumatic eyes are anicteric    Other:    Pertinant exam for procedure: Soft nontender nondistended bowel sounds positive normoactive  Planned proceedures: Colonoscopy and indicated procedures. I have discussed the risks benefits and complications of procedures to include not limited to bleeding, infection, perforation and the risk of sedation and the patient wishes to proceed.    Suzanne Sails, MD Gastroenterology 02/03/2018  11:43 AM

## 2018-02-03 NOTE — Op Note (Signed)
[facility] Gastroenterology Patient Name: [name] Procedure Date: 02/03/2018 11:49 AM MRN: [id number] Account #: [id number] Date of Birth: 1940/10/29 Admit Type: Outpatient Age: 77 Room: [facility] ENDO ROOM 3 Gender: Female Note Status: Finalized Procedure:            Colonoscopy Indications:          Screening for colorectal malignant neoplasm, This is                        the patient's first colonoscopy Providers:            Lollie Sails, MD Referring MD:         Venetia Maxon. Elijio Miles, MD (Referring MD) Medicines:            Monitored Anesthesia Care Complications:        No immediate complications. Procedure:            Pre-Anesthesia Assessment:                       - ASA Grade Assessment: III - A patient with severe                        systemic disease.                       After obtaining informed consent, the colonoscope was                        passed under direct vision. Throughout the procedure,                        the patient's blood pressure, pulse, and oxygen                        saturations were monitored continuously. The                        Colonoscope was introduced through the anus and                        advanced to the the cecum, identified by appendiceal                        orifice and ileocecal valve. The colonoscopy was                        performed without difficulty. The patient tolerated the                        procedure well. The quality of the bowel preparation                        was good. Findings:      Multiple small-mouthed diverticula were found in the sigmoid colon and       descending colon.      A 3 mm polyp was found in the sigmoid colon. The polyp was sessile. The       polyp was removed with a cold snare. Resection and retrieval were       complete.      A 3 mm polyp was found in the descending  colon. The polyp was sessile.       The polyp was removed with a cold biopsy forceps.  Resection and       retrieval were complete.      A diffuse area of mild melanosis was found in the entire colon.      Non-bleeding external and internal hemorrhoids were found during       retroflexion, during digital exam and during anoscopy. The hemorrhoids       were small and Grade II (internal hemorrhoids that prolapse but reduce       spontaneously). Impression:           - Diverticulosis in the sigmoid colon and in the                        descending colon.                       - One 3 mm polyp in the sigmoid colon, removed with a                        cold snare. Resected and retrieved.                       - One 3 mm polyp in the descending colon, removed with                        a cold biopsy forceps. Resected and retrieved.                       - Melanosis in the colon.                       - Non-bleeding external and internal hemorrhoids. Recommendation:       - Telephone GI clinic for pathology results in 1 week. Procedure Code(s):    --- Professional ---                       [phone], Colonoscopy, flexible; with removal of tumor(s),                        polyp(s), or other lesion(s) by snare technique                       45380, 20, Colonoscopy, flexible; with biopsy, single                        or multiple Diagnosis Code(s):    --- Professional ---                       Z12.11, Encounter for screening for malignant neoplasm                        of colon                       K64.1, Second degree hemorrhoids                       D12.5, Benign neoplasm of sigmoid colon  D12.4, Benign neoplasm of descending colon                       K63.89, Other specified diseases of intestine                       K57.30, Diverticulosis of large intestine without                        perforation or abscess without bleeding CPT copyright 2018 American Medical Association. All rights reserved. The codes documented in this report are preliminary and  upon coder review may  be revised to meet current compliance requirements. Lollie Sails, MD 02/03/2018 12:27:29 PM This report has been signed electronically. Number of Addenda: 0 Note Initiated On: 02/03/2018 11:49 AM Scope Withdrawal Time: 0 hours 8 minutes 7 seconds  Total Procedure Duration: 0 hours 24 minutes 24 seconds       [facility]

## 2018-02-03 NOTE — Transfer of Care (Signed)
Immediate Anesthesia Transfer of Care Note  Patient: [name]  Procedure(s) Performed: COLONOSCOPY WITH PROPOFOL (N/A )  Patient Location: PACU and Endoscopy Unit  Anesthesia Type:General  Level of Consciousness: drowsy  Airway & Oxygen Therapy: Patient Spontanous Breathing and Patient connected to nasal cannula oxygen  Post-op Assessment: Report given to RN and Post -op Vital signs reviewed and stable  Post vital signs: Reviewed and stable  Last Vitals:  Vitals Value Taken Time  BP    Temp    Pulse    Resp    SpO2      Last Pain:  Vitals:   02/03/18 1120  TempSrc: Tympanic  PainSc: 0-No pain         Complications: No apparent anesthesia complications

## 2018-02-03 NOTE — Anesthesia Post-op Follow-up Note (Signed)
Anesthesia QCDR form completed.        

## 2018-02-04 ENCOUNTER — Encounter: Payer: Self-pay | Admitting: Gastroenterology

## 2018-02-04 LAB — SURGICAL PATHOLOGY

## 2018-02-25 ENCOUNTER — Other Ambulatory Visit: Payer: Self-pay | Admitting: Otolaryngology

## 2018-02-25 DIAGNOSIS — H918X1 Other specified hearing loss, right ear: Secondary | ICD-10-CM

## 2018-02-25 DIAGNOSIS — IMO0001 Reserved for inherently not codable concepts without codable children: Secondary | ICD-10-CM

## 2018-03-17 ENCOUNTER — Other Ambulatory Visit: Payer: Self-pay | Admitting: Otolaryngology

## 2018-03-17 ENCOUNTER — Ambulatory Visit
Admission: RE | Admit: 2018-03-17 | Discharge: 2018-03-17 | Disposition: A | Discharge status: Home / Self Care | Payer: Medicare HMO | Source: Ambulatory Visit | Attending: Otolaryngology | Admitting: Otolaryngology

## 2018-03-17 DIAGNOSIS — IMO0001 Reserved for inherently not codable concepts without codable children: Secondary | ICD-10-CM

## 2018-03-17 DIAGNOSIS — H9041 Sensorineural hearing loss, unilateral, right ear, with unrestricted hearing on the contralateral side: Secondary | ICD-10-CM | POA: Diagnosis present

## 2018-03-17 DIAGNOSIS — H918X1 Other specified hearing loss, right ear: Secondary | ICD-10-CM

## 2018-03-17 LAB — POCT I-STAT CREATININE: CREATININE: 1.6 mg/dL — AB (ref 0.44–1.00)

## 2018-06-18 ENCOUNTER — Inpatient Hospital Stay: Payer: Medicare HMO | Attending: Urgent Care

## 2018-06-18 DIAGNOSIS — N183 Chronic kidney disease, stage 3 (moderate): Secondary | ICD-10-CM | POA: Diagnosis not present

## 2018-06-18 DIAGNOSIS — D509 Iron deficiency anemia, unspecified: Secondary | ICD-10-CM | POA: Insufficient documentation

## 2018-06-18 DIAGNOSIS — Z79899 Other long term (current) drug therapy: Secondary | ICD-10-CM | POA: Insufficient documentation

## 2018-06-18 DIAGNOSIS — I129 Hypertensive chronic kidney disease with stage 1 through stage 4 chronic kidney disease, or unspecified chronic kidney disease: Secondary | ICD-10-CM | POA: Insufficient documentation

## 2018-06-18 DIAGNOSIS — E1122 Type 2 diabetes mellitus with diabetic chronic kidney disease: Secondary | ICD-10-CM | POA: Insufficient documentation

## 2018-06-18 DIAGNOSIS — D631 Anemia in chronic kidney disease: Secondary | ICD-10-CM | POA: Diagnosis not present

## 2018-06-18 LAB — CBC WITH DIFFERENTIAL/PLATELET
ABS IMMATURE GRANULOCYTES: 0.04 10*3/uL (ref 0.00–0.07)
BASOS ABS: 0.1 10*3/uL (ref 0.0–0.1)
BASOS PCT: 1 %
EOS ABS: 0.3 10*3/uL (ref 0.0–0.5)
Eosinophils Relative: 3 %
HCT: 40.9 % (ref 36.0–46.0)
Hemoglobin: 13.5 g/dL (ref 12.0–15.0)
IMMATURE GRANULOCYTES: 0 %
Lymphocytes Relative: 26 %
Lymphs Abs: 2.7 10*3/uL (ref 0.7–4.0)
MCH: 29.2 pg (ref 26.0–34.0)
MCHC: 33 g/dL (ref 30.0–36.0)
MCV: 88.3 fL (ref 80.0–100.0)
Monocytes Absolute: 0.7 10*3/uL (ref 0.1–1.0)
Monocytes Relative: 7 %
NEUTROS PCT: 63 %
NRBC: 0 % (ref 0.0–0.2)
Neutro Abs: 6.7 10*3/uL (ref 1.7–7.7)
PLATELETS: 204 10*3/uL (ref 150–400)
RBC: 4.63 MIL/uL (ref 3.87–5.11)
RDW: 13.4 % (ref 11.5–15.5)
WBC: 10.5 10*3/uL (ref 4.0–10.5)

## 2018-06-18 LAB — COMPREHENSIVE METABOLIC PANEL
ALBUMIN: 4.4 g/dL (ref 3.5–5.0)
ALK PHOS: 108 U/L (ref 38–126)
ALT: 19 U/L (ref 0–44)
ANION GAP: 11 (ref 5–15)
AST: 29 U/L (ref 15–41)
BILIRUBIN TOTAL: 0.8 mg/dL (ref 0.3–1.2)
BUN: 28 mg/dL — ABNORMAL HIGH (ref 8–23)
CALCIUM: 9.4 mg/dL (ref 8.9–10.3)
CO2: 20 mmol/L — AB (ref 22–32)
Chloride: 108 mmol/L (ref 98–111)
Creatinine, Ser: 1.64 mg/dL — ABNORMAL HIGH (ref 0.44–1.00)
GFR calc non Af Amer: 30 mL/min — ABNORMAL LOW (ref >60)
GFR, EST AFRICAN AMERICAN: 34 mL/min — AB (ref >60)
Glucose, Bld: 142 mg/dL — ABNORMAL HIGH (ref 70–99)
Potassium: 3.9 mmol/L (ref 3.5–5.1)
SODIUM: 139 mmol/L (ref 135–145)
TOTAL PROTEIN: 7.7 g/dL (ref 6.5–8.1)

## 2018-06-18 LAB — IRON AND TIBC
Iron: 68 ug/dL (ref 28–170)
SATURATION RATIOS: 22 % (ref 10.4–31.8)
TIBC: 307 ug/dL (ref 250–450)
UIBC: 239 ug/dL

## 2018-06-18 LAB — FERRITIN: Ferritin: 220 ng/mL (ref 11–307)

## 2018-06-23 ENCOUNTER — Ambulatory Visit: Payer: Medicare HMO | Admitting: Internal Medicine

## 2018-06-23 ENCOUNTER — Ambulatory Visit: Payer: Medicare HMO

## 2018-06-24 ENCOUNTER — Ambulatory Visit: Payer: Medicare HMO | Admitting: Hematology and Oncology

## 2018-06-24 ENCOUNTER — Inpatient Hospital Stay: Payer: Medicare HMO

## 2018-06-24 ENCOUNTER — Inpatient Hospital Stay: Payer: Medicare HMO | Admitting: Hematology and Oncology

## 2018-07-15 ENCOUNTER — Other Ambulatory Visit: Payer: Self-pay

## 2018-07-15 DIAGNOSIS — D631 Anemia in chronic kidney disease: Secondary | ICD-10-CM

## 2018-07-15 DIAGNOSIS — N183 Chronic kidney disease, stage 3 unspecified: Secondary | ICD-10-CM

## 2018-07-21 ENCOUNTER — Other Ambulatory Visit: Payer: Self-pay

## 2018-07-21 ENCOUNTER — Other Ambulatory Visit: Payer: Medicare HMO

## 2018-07-21 NOTE — Progress Notes (Signed)
[facility]  [address], [address] [place], [place] [address] Phone: [phone]  Fax: [phone]   Telephone Office Visit:  07/22/2018   Referring physician:  Jodi Marble, MD   I connected with [name] on 07/22/18 at 1:26 PM EDT by telephone and verified that I was speaking with the correct person using 2 identifiers.  The patient was at home.  I discussed the limitations, risk, security and privacy concerns of performing an evaluation and management service by telephone and the availability of in person appointments.  I also discussed with the patient that there may be a patient responsible charge related to this service.  The patient expressed understanding and agreed to proceed.    Chief Complaint: [name] is a 78 y.o. 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  HPI:   The patient was initially seen in consultation by Dr. Rogue Bussing on 05/28/2017 for worsening anemia.  She has chronic kidney disease felt secondary to diabetes and hypertension.  She had been feeling fatigued for several months.  She noted shortness of breath on exertion  Work-up revealed a hematocrit of 31.5, hemoglobin 10.3, MCV 87.5, platelets 144,000, white count 6200 with an ANC of 4100.  Retic was 1.2%.  Ferritin was 41.  Iron saturation was 10% with a TIBC of 387.  B12 was 657.  Folate was 15.5.  TSH was 3.268.  BUN was 54 with a creatinine of 1.96.  SPEP revealed no monoclonal protein.  Kappa free light chains were 37.4, lambda free light chains 25, and ratio 1.5 (normal).  LDH was 172.  The patient was last seen by Dr. Yevette Edwards on 12/23/2017.  At that time she denied any of shortness of breath or cough.  Hemoglobin had improved to 11.9 after IV iron.  Plan was to continue iron rich food and oral iron.  Aranesp was being held.  Colonoscopy on 02/03/2018 by Dr Gustavo Lah revealed  diverticulosis in the sigmoid colon and in the descending colon.  There was one 3 mm polyp in the sigmoid colon and one 3 mm polyp in the descending colon.  There was melanosis in the colon and non-bleeding external and internal hemorrhoids.  Pathology revealed 2 tubular adenomas. Next  Colonoscopy planned in 2024.  CBCs have been followed: 09/13/2014:  Hematocrit 35.4, hemoglobin 11.6, and MCV 81.8. 05/28/2017:  Hematocrit 31.5, hemoglobin 10.3, and MCV 87.5. 08/19/2017:  Hematocrit 33.5, hemoglobin 11.0, and MCV 86.4 12/19/2017:  Hematocrit 35.6, hemoglobin 11.9, and MCV 88.8  She has received Venofer on several occasions: weekly x4 (06/25/2017 - 07/16/2017), 08/19/2017, and 12/23/2017.    Ferritin has been followed: 41 on 05/28/2017, 179 on 12/19/2017, and 220 on 06/18/2018.  She notes a good diet.  She eats chicken and [place] about 4 times a week.  She eats collards or spinach 4-7 times a week.  She takes oral iron 65 mg 1 a day   Symptomatically,she denies any complaints.  She denies any melena, hematochezia, hematuria or vaginal bleeding.   Past Medical History:  Diagnosis Date  . Arthritis    "hands, feet, knees, shoulders" (05/05/2014)  . Bipolar disorder ([place])   . Cataracts, bilateral    immature  . Chronic kidney disease    "dr says they aren't functioning like they should" (05/05/2014)  . Coronary artery disease   . Depression    takes Zoloft daily  . History  of bronchitis 56yrs ago  . Hyperlipidemia    takes Atorvastatin daily  . Hypertension    takes Metoprolol,Lisinopril,and Apresoline daily  . Joint pain   . Peripheral edema    takes Chlothalidone daily  . 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 Squamous cell cancer of skin of nose   . Type II diabetes mellitus ([place])    takes Tradjenta daily    Past Surgical History:  Procedure Laterality Date  . COLONOSCOPY WITH PROPOFOL N/A 02/03/2018   Procedure: COLONOSCOPY WITH PROPOFOL;  Surgeon:  Lollie Sails, MD;  Location: [facility] ENDOSCOPY;  Service: Endoscopy;  Laterality: N/A;  . CORONARY ARTERY BYPASS GRAFT  05/12/2007   "CABG X1; at [facility]"  . coronary artery bypass with arterial grafts    . DILATION AND CURETTAGE OF UTERUS  2005  . FRACTURE SURGERY    . I&D EXTREMITY Right 04/30/2014   Procedure: IRRIGATION AND DEBRIDEMENT EXTREMITY;  Surgeon: Wylene Simmer, MD;  Location: [place];  Service: Orthopedics;  Laterality: Right;  . INCISION AND DRAINAGE Right 08/11/2014   Procedure: REMOVAL OF DEEP IMPLANTS OF RIGHT TIBIAL FIBIAL INCISION AND DRAINAGE OF RIGHT ANKLE WOUND;  Surgeon: Wylene Simmer, MD;  Location: [facility];  Service: Orthopedics;  Laterality: Right;  . ORIF ANKLE FRACTURE Right 04/30/2014   Procedure: OPEN REDUCTION INTERNAL FIXATION (ORIF) ANKLE FRACTURE;  Surgeon: Wylene Simmer, MD;  Location: [facility];  Service: Orthopedics;  Laterality: Right;  . REMOVAL OF IMPLANT Right 08/11/2014   Procedure: REMOVAL OF DEEP IMPLANTS OF RIGHT TIBIAL FIBIAL INCISION AND DRAINAGE OF RIGHT ANKLE WOUND;  Surgeon: Wylene Simmer, MD;  Location: [place];  Service: Orthopedics;  Laterality: Right;  . SQUAMOUS CELL CARCINOMA EXCISION Right 2012   "nose"  . VAGINAL HYSTERECTOMY  2007    History reviewed. No pertinent family history.  Social History:  reports that she has never smoked. She has never used smokeless tobacco. She reports that she does not drink alcohol or use drugs.  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 She lives in [place].    Participants in the patient's visit included the patient and Waymon Budge, Therapist, sports, today.  The intake visit was provided by Waymon Budge, RN.   Allergies:  Allergies  Allergen Reactions  . Codeine Nausea And Vomiting    "Makes me deathly sick"    Current Medications: Current Outpatient Medications  Medication Sig Dispense Refill  . amLODipine (NORVASC) 10 MG tablet Take 10 mg by mouth daily.    Marland Kitchen aspirin EC 325 MG tablet Take 325 mg by mouth  daily.    Marland Kitchen atorvastatin (LIPITOR) 80 MG tablet Take 80 mg by mouth daily at 6 PM.     . cetirizine (ZYRTEC) 10 MG tablet Take 10 mg by mouth daily.     . chlorthalidone (HYGROTON) 25 MG tablet Take 25 mg by mouth daily.   2  . Cholecalciferol (VITAMIN D3) 5000 units TABS Take 1 tablet by mouth daily.    . diclofenac sodium (VOLTAREN) 1 % GEL Apply 2 g topically as needed.     . ferrous sulfate 325 (65 FE) MG tablet Take 1 tablet by mouth daily.    . fluticasone (FLONASE) 50 MCG/ACT nasal spray Place 1 spray into both nostrils daily.    . hydrALAZINE (APRESOLINE) 50 MG tablet Take 50 mg by mouth 3 (three) times daily.   2  . lisinopril (PRINIVIL,ZESTRIL) 40 MG tablet Take 40 mg by  mouth daily.   2  . metoprolol succinate (TOPROL-XL) 50 MG 24 hr tablet Take 50 mg by mouth daily.     . sertraline (ZOLOFT) 50 MG tablet Take 50 mg by mouth daily.     . traMADol-acetaminophen (ULTRACET) 37.5-325 MG per tablet Take 1 tablet by mouth every 6 (six) hours as needed.     No current facility-administered medications for this visit.     Review of Systems:  GENERAL:  Feels good.  No fevers, sweats or weight loss. PERFORMANCE STATUS (ECOG):  1 HEENT:  No visual changes, runny nose, sore throat, mouth sores or tenderness. Lungs:  No shortness of breath or cough.  No hemoptysis. Cardiac:  No chest pain, palpitations, orthopnea, or PND. GI:  No nausea, vomiting, diarrhea, constipation, melena or hematochezia. GU:  No urgency, frequency, dysuria, or hematuria. Musculoskeletal:  No back pain.  Arthritis in knees.  No muscle tenderness. Extremities:  No pain or swelling. Skin:  No rashes or skin changes. Neuro:  No headache, numbness or weakness, balance or coordination issues. Endocrine:  No diabetes, thyroid issues, hot flashes or night sweats. Psych:  Bipolar.  No mood changes, depression or anxiety. Pain:  Knee pain on tramadol. Review of systems:  All other systems reviewed and found to be negative.    No visits with results within 3 Day(s) from this visit.  Latest known visit with results is:  Appointment on 06/18/2018  Component Date Value Ref Range Status  . Iron 06/18/2018 68  28 - 170 ug/dL Final  . TIBC 06/18/2018 307  250 - 450 ug/dL Final  . Saturation Ratios 06/18/2018 22  10.4 - 31.8 % Final  . UIBC 06/18/2018 239  ug/dL Final   Performed at [facility], [address], [place], [place] [address]  . Ferritin 06/18/2018 220  11 - 307 ng/mL Final   Performed at [facility], [address] [place], [place] [address]  . Sodium 06/18/2018 139  135 - 145 mmol/L Final  . Potassium 06/18/2018 3.9  3.5 - 5.1 mmol/L Final  . Chloride 06/18/2018 108  98 - 111 mmol/L Final  . CO2 06/18/2018 20* 22 - 32 mmol/L Final  . Glucose, Bld 06/18/2018 142* 70 - 99 mg/dL Final  . BUN 06/18/2018 28* 8 - 23 mg/dL Final  . Creatinine, Ser 06/18/2018 1.64* 0.44 - 1.00 mg/dL Final  . Calcium 06/18/2018 9.4  8.9 - 10.3 mg/dL Final  . Total Protein 06/18/2018 7.7  6.5 - 8.1 g/dL Final  . Albumin 06/18/2018 4.4  3.5 - 5.0 g/dL Final  . AST 06/18/2018 29  15 - 41 U/L Final  . ALT 06/18/2018 19  0 - 44 U/L Final  . Alkaline Phosphatase 06/18/2018 108  38 - 126 U/L Final  . Total Bilirubin 06/18/2018 0.8  0.3 - 1.2 mg/dL Final  . GFR calc non Af Amer 06/18/2018 30* >60 mL/min Final  . GFR calc Af Amer 06/18/2018 34* >60 mL/min Final  . Anion gap 06/18/2018 11  5 - 15 Final   Performed at [facility], [address] [place], [place] [address]  . WBC 06/18/2018 10.5  4.0 - 10.5 K/uL Final  . RBC 06/18/2018 4.63  3.87 - 5.11 MIL/uL Final  . Hemoglobin 06/18/2018 13.5  12.0 - 15.0 g/dL Final  . HCT 06/18/2018 40.9  36.0 - 46.0 % Final  . MCV 06/18/2018 88.3  80.0 - 100.0 fL Final  . MCH 06/18/2018 29.2  26.0 -  34.0 pg Final  . MCHC 06/18/2018 33.0  30.0 - 36.0 g/dL Final  . RDW 06/18/2018 13.4  11.5 - 15.5 % Final  . Platelets 06/18/2018 204  150 - 400 K/uL Final   . nRBC 06/18/2018 0.0  0.0 - 0.2 % Final  . Neutrophils Relative % 06/18/2018 63  % Final  . Neutro Abs 06/18/2018 6.7  1.7 - 7.7 K/uL Final  . Lymphocytes Relative 06/18/2018 26  % Final  . Lymphs Abs 06/18/2018 2.7  0.7 - 4.0 K/uL Final  . Monocytes Relative 06/18/2018 7  % Final  . Monocytes Absolute 06/18/2018 0.7  0.1 - 1.0 K/uL Final  . Eosinophils Relative 06/18/2018 3  % Final  . Eosinophils Absolute 06/18/2018 0.3  0.0 - 0.5 K/uL Final  . Basophils Relative 06/18/2018 1  % Final  . Basophils Absolute 06/18/2018 0.1  0.0 - 0.1 K/uL Final  . Immature Granulocytes 06/18/2018 0  % Final  . Abs Immature Granulocytes 06/18/2018 0.04  0.00 - 0.07 K/uL Final   Performed at [facility], [address], [place],  [address]    Assessment:  [name] is a 78 y.o. female with iron deficiency anemia and stage III chronic kidney disease.  Work-up on 06/07/2017 revealed a hematocrit of 31.5, hemoglobin 10.3, MCV 87.5, platelets 144,000, white count 6200 with an [place] of 4100.  Retic was 1.2%.  Ferritin was 41.  Iron saturation was 10% with a TIBC of 387.  Normal studies included: B12 (657), folate, TSH, SPEP, free light chain ratio, and LDH. BUN was 54 with a creatinine of 1.96.    Colonoscopy on 02/03/2018 revealed diverticulosis in the sigmoid colon and in the descending colon.  There was one 3 mm polyp in the sigmoid colon and one 3 mm polyp in the descending colon.  There was melanosis in the colon and non-bleeding external and internal hemorrhoids.  Pathology revealed 2 tubular adenomas.  She has received Venofer on several occasions: weekly x4 (06/25/2017 - 07/16/2017), 08/19/2017, and 12/23/2017.    Ferritin has been followed: 41 on 05/28/2017, 179 on 12/19/2017, and 220 on 06/18/2018.  Symptomatically, she denies any complaint.  Plan: 1.   Discuss entire medical history, diagnosis and management of iron deficiency anemia and support of chronic kidney disease.  2.   Iron deficiency anemia  Hemoglobin 13.5.  MCV 88.3.  Ferritin 220.  No Venofer needed. 3.   Stage III chronic kidney disease  Hemoglobin > 10.  No ESA needed. 4.   RTC in 3 months for labs (CBC with diff, BMP, ferritin). 5.   RTC in 6 months for MD assessment and labs (CBC with diff, CMP, ferritin).   I discussed the assessment and treatment plan with the patient.  The patient was provided an opportunity to ask questions and all were answered.  The patient agreed with the plan and demonstrated an understanding of the instructions.  The patient was advised to call back or seek an in person evaluation if the symptoms worsen or if the condition fails to improve as anticipated.  I provided 14 minutes (1:26 PM - 1:40 PM) of non-face-to-face time during this encounter.  I provided these services from the [facility] office.   Lequita Asal, MD, PhD  07/22/2018, 1:26 PM

## 2018-07-22 ENCOUNTER — Encounter: Payer: Self-pay | Admitting: Hematology and Oncology

## 2018-07-22 ENCOUNTER — Ambulatory Visit: Payer: Medicare HMO

## 2018-07-22 ENCOUNTER — Inpatient Hospital Stay: Payer: Medicare HMO | Attending: Hematology and Oncology | Admitting: Hematology and Oncology

## 2018-07-22 DIAGNOSIS — N183 Chronic kidney disease, stage 3 (moderate): Secondary | ICD-10-CM | POA: Diagnosis not present

## 2018-07-22 DIAGNOSIS — D631 Anemia in chronic kidney disease: Secondary | ICD-10-CM

## 2018-07-22 DIAGNOSIS — D509 Iron deficiency anemia, unspecified: Secondary | ICD-10-CM

## 2018-07-22 NOTE — Progress Notes (Signed)
Confirmed name, DOB, and address. Pt. Denies any concerns at this time.

## 2018-10-21 ENCOUNTER — Other Ambulatory Visit: Payer: Self-pay

## 2018-10-21 ENCOUNTER — Inpatient Hospital Stay: Payer: Medicare HMO | Attending: Hematology and Oncology

## 2018-10-21 DIAGNOSIS — N183 Chronic kidney disease, stage 3 unspecified: Secondary | ICD-10-CM

## 2018-10-21 DIAGNOSIS — D509 Iron deficiency anemia, unspecified: Secondary | ICD-10-CM | POA: Diagnosis not present

## 2018-10-21 DIAGNOSIS — E1122 Type 2 diabetes mellitus with diabetic chronic kidney disease: Secondary | ICD-10-CM | POA: Diagnosis not present

## 2018-10-21 DIAGNOSIS — I129 Hypertensive chronic kidney disease with stage 1 through stage 4 chronic kidney disease, or unspecified chronic kidney disease: Secondary | ICD-10-CM | POA: Diagnosis present

## 2018-10-21 DIAGNOSIS — D631 Anemia in chronic kidney disease: Secondary | ICD-10-CM | POA: Diagnosis not present

## 2018-10-21 DIAGNOSIS — Z79899 Other long term (current) drug therapy: Secondary | ICD-10-CM | POA: Insufficient documentation

## 2018-10-21 LAB — CBC WITH DIFFERENTIAL/PLATELET
Abs Immature Granulocytes: 0.03 10*3/uL (ref 0.00–0.07)
Basophils Absolute: 0 10*3/uL (ref 0.0–0.1)
Basophils Relative: 0 %
Eosinophils Absolute: 0.2 10*3/uL (ref 0.0–0.5)
Eosinophils Relative: 2 %
HCT: 39.6 % (ref 36.0–46.0)
Hemoglobin: 13.3 g/dL (ref 12.0–15.0)
Immature Granulocytes: 0 %
Lymphocytes Relative: 26 %
Lymphs Abs: 2.4 10*3/uL (ref 0.7–4.0)
MCH: 29.6 pg (ref 26.0–34.0)
MCHC: 33.6 g/dL (ref 30.0–36.0)
MCV: 88.2 fL (ref 80.0–100.0)
Monocytes Absolute: 0.6 10*3/uL (ref 0.1–1.0)
Monocytes Relative: 7 %
Neutro Abs: 5.8 10*3/uL (ref 1.7–7.7)
Neutrophils Relative %: 65 %
Platelets: 210 10*3/uL (ref 150–400)
RBC: 4.49 MIL/uL (ref 3.87–5.11)
RDW: 13.3 % (ref 11.5–15.5)
WBC: 9 10*3/uL (ref 4.0–10.5)
nRBC: 0 % (ref 0.0–0.2)

## 2018-10-21 LAB — COMPREHENSIVE METABOLIC PANEL
ALT: 18 U/L (ref 0–44)
AST: 25 U/L (ref 15–41)
Albumin: 4.4 g/dL (ref 3.5–5.0)
Alkaline Phosphatase: 86 U/L (ref 38–126)
Anion gap: 12 (ref 5–15)
BUN: 25 mg/dL — ABNORMAL HIGH (ref 8–23)
CO2: 22 mmol/L (ref 22–32)
Calcium: 9.6 mg/dL (ref 8.9–10.3)
Chloride: 107 mmol/L (ref 98–111)
Creatinine, Ser: 1.5 mg/dL — ABNORMAL HIGH (ref 0.44–1.00)
GFR calc Af Amer: 38 mL/min — ABNORMAL LOW (ref >60)
GFR calc non Af Amer: 33 mL/min — ABNORMAL LOW (ref >60)
Glucose, Bld: 95 mg/dL (ref 70–99)
Potassium: 3.9 mmol/L (ref 3.5–5.1)
Sodium: 141 mmol/L (ref 135–145)
Total Bilirubin: 0.8 mg/dL (ref 0.3–1.2)
Total Protein: 7.9 g/dL (ref 6.5–8.1)

## 2018-10-21 LAB — FERRITIN: Ferritin: 200 ng/mL (ref 11–307)

## 2019-01-19 ENCOUNTER — Other Ambulatory Visit: Payer: Self-pay

## 2019-01-19 DIAGNOSIS — D509 Iron deficiency anemia, unspecified: Secondary | ICD-10-CM

## 2019-01-19 NOTE — Progress Notes (Signed)
[facility]  [address] [place], [place] [address] Phone: [phone]  Fax: [phone]   Clinic Day:  01/21/2019  Referring physician: Jodi Marble, MD  Chief Complaint: [name] is a 78 y.o. 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  HPI: The patient was last seen in the hematology clinic for a new patient visit via telemedicine on 07/22/2018. At that time, she denied any complaint. Hemoglobin 13.5. MCV 88.3. Ferritin was 220. She was on oral iron.   Labs on 10/21/2018: Hematocrit 39.6, hemoglobin 13.3, MCV 88.2, platelets 210,000, WBC 9,000. Ferritin 200. BUN 25. Creatinine 1.50.   During the interim, she feels "fine". Her energy level is "not good".  She has bilateral arthritic knee pain (5/10). She is eating well. She denies any bleeding. She is taking a multivitamin along with oral iron 325 mg daily and vitamin D. Her creatinine is 1.56 and her CrCl is 33.2 ml/min today. CBC is normal today.  I discussed cutting back her oral iron.   Her nephrologist is Dr. Holley Raring. She notes following up with her PCP and her cardiologist every 3 months.    Past Medical History:  Diagnosis Date  . Arthritis    "hands, feet, knees, shoulders" (05/05/2014)  . Bipolar disorder ([place])   . Cataracts, bilateral    immature  . Chronic kidney disease    "dr says they aren't functioning like they should" (05/05/2014)  . Coronary artery disease   . Depression    takes Zoloft daily  . History of bronchitis 62yrs ago  . Hyperlipidemia    takes Atorvastatin daily  . Hypertension    takes Metoprolol,Lisinopril,and Apresoline daily  . Joint pain   . Peripheral edema    takes Chlothalidone daily  . 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 Squamous cell cancer of skin of nose   . Type II diabetes mellitus ([place])    takes Tradjenta daily    Past Surgical  History:  Procedure Laterality Date  . COLONOSCOPY WITH PROPOFOL N/A 02/03/2018   Procedure: COLONOSCOPY WITH PROPOFOL;  Surgeon: Lollie Sails, MD;  Location: [facility] ENDOSCOPY;  Service: Endoscopy;  Laterality: N/A;  . CORONARY ARTERY BYPASS GRAFT  05/12/2007   "CABG X1; at [facility]"  . coronary artery bypass with arterial grafts    . DILATION AND CURETTAGE OF UTERUS  2005  . FRACTURE SURGERY    . I&D EXTREMITY Right 04/30/2014   Procedure: IRRIGATION AND DEBRIDEMENT EXTREMITY;  Surgeon: Wylene Simmer, MD;  Location: [place];  Service: Orthopedics;  Laterality: Right;  . INCISION AND DRAINAGE Right 08/11/2014   Procedure: REMOVAL OF DEEP IMPLANTS OF RIGHT TIBIAL FIBIAL INCISION AND DRAINAGE OF RIGHT ANKLE WOUND;  Surgeon: Wylene Simmer, MD;  Location: [place];  Service: Orthopedics;  Laterality: Right;  . ORIF ANKLE FRACTURE Right 04/30/2014   Procedure: OPEN REDUCTION INTERNAL FIXATION (ORIF) ANKLE FRACTURE;  Surgeon: Wylene Simmer, MD;  Location: [place];  Service: Orthopedics;  Laterality: Right;  . REMOVAL OF IMPLANT Right 08/11/2014   Procedure: REMOVAL OF DEEP IMPLANTS OF RIGHT TIBIAL FIBIAL INCISION AND DRAINAGE OF RIGHT ANKLE WOUND;  Surgeon: Wylene Simmer, MD;  Location: [place];  Service: Orthopedics;  Laterality: Right;  . SQUAMOUS CELL CARCINOMA EXCISION Right 2012   "nose"  . VAGINAL HYSTERECTOMY  2007    History reviewed. No pertinent family history.  Social  History:  reports that she has never smoked. She has never used smokeless tobacco. She reports that she does not drink alcohol or use drugs. 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 She lives in [place].   The patient is alone today.  Allergies:  Allergies  Allergen Reactions  . Codeine Nausea And Vomiting    "Makes me deathly sick"    Current Medications: Current Outpatient Medications  Medication Sig Dispense Refill  . amLODipine (NORVASC) 10 MG tablet Take 10 mg by mouth daily.    Marland Kitchen atorvastatin (LIPITOR) 80 MG tablet  Take 80 mg by mouth daily at 6 PM.     . cetirizine (ZYRTEC) 10 MG tablet Take 10 mg by mouth daily.     . chlorthalidone (HYGROTON) 25 MG tablet Take 25 mg by mouth daily.   2  . Cholecalciferol (VITAMIN D3) 5000 units TABS Take 1 tablet by mouth daily.    . ferrous sulfate 325 (65 FE) MG tablet Take 1 tablet by mouth daily.    . fluticasone (FLONASE) 50 MCG/ACT nasal spray Place 1 spray into both nostrils daily.    . hydrALAZINE (APRESOLINE) 50 MG tablet Take 50 mg by mouth 3 (three) times daily.   2  . lisinopril (PRINIVIL,ZESTRIL) 40 MG tablet Take 40 mg by mouth daily.   2  . sertraline (ZOLOFT) 50 MG tablet Take 50 mg by mouth daily.     . traMADol-acetaminophen (ULTRACET) 37.5-325 MG per tablet Take 1 tablet by mouth every 6 (six) hours as needed.    Marland Kitchen aspirin EC 325 MG tablet Take 325 mg by mouth daily.    . diclofenac sodium (VOLTAREN) 1 % GEL Apply 2 g topically as needed.     . metoprolol succinate (TOPROL-XL) 50 MG 24 hr tablet Take 50 mg by mouth daily.      No current facility-administered medications for this visit.     Review of Systems  Constitutional: Positive for malaise/fatigue. Negative for chills, diaphoresis, fever and weight loss (stable, since 12/23/2017).       Feels "fine".  Energy level is "not good".  HENT: Negative for congestion, ear pain, hearing loss (decreased), nosebleeds, sinus pain, sore throat and tinnitus.   Eyes: Negative.  Negative for blurred vision and double vision.  Respiratory: Negative.  Negative for cough, hemoptysis, sputum production and shortness of breath.   Cardiovascular: Negative.  Negative for chest pain, palpitations and leg swelling.  Gastrointestinal: Negative.  Negative for abdominal pain, blood in stool, constipation, diarrhea, heartburn, melena, nausea and vomiting.       Eating well.  Genitourinary: Negative.  Negative for dysuria, frequency, hematuria and urgency.  Musculoskeletal: Positive for joint pain (arthritis in knees,  on tramadol). Negative for back pain, myalgias and neck pain.  Skin: Negative.  Negative for itching and rash.  Neurological: Negative.  Negative for dizziness, tingling, sensory change, focal weakness, weakness and headaches.  Endo/Heme/Allergies: Negative.  Does not bruise/bleed easily.  Psychiatric/Behavioral: Negative for depression and memory loss. The patient is not nervous/anxious and does not have insomnia.        Bipolar disorder.  All other systems reviewed and are negative.  Performance status (ECOG): 1  Vitals Blood pressure 113/65, pulse 70, temperature 97.8 F (36.6 C), temperature source Tympanic, resp. rate 18, height 5' 4.5" (1.638 m), weight 204 lb 9.4 oz (92.8 kg), SpO2 98 %.  Physical Exam  Constitutional: She is oriented to person, place, and time. She appears well-developed and  well-nourished. No distress.  She has a rolling walker at her side.  HENT:  Head: Normocephalic and atraumatic.  Mouth/Throat: Oropharynx is clear and moist. No oropharyngeal exudate.  Long gray hair.  Mask.  Eyes: Pupils are equal, round, and reactive to light. Conjunctivae and EOM are normal. No scleral icterus.  Brown/hazel eyes.  Neck: Normal range of motion. Neck supple. No JVD present.  Cardiovascular: Normal rate, regular rhythm and normal heart sounds.  No murmur heard. Pulmonary/Chest: Effort normal and breath sounds normal. No respiratory distress. She has no wheezes. She has no rales. She exhibits no tenderness.  Abdominal: Soft. Bowel sounds are normal. She exhibits no distension and no mass. There is no abdominal tenderness. There is no rebound and no guarding.  Musculoskeletal: Normal range of motion.        General: No tenderness.  Lymphadenopathy:    She has no cervical adenopathy.    She has no axillary adenopathy.       Right: No inguinal and no supraclavicular adenopathy present.       Left: No inguinal and no supraclavicular adenopathy present.  Neurological: She is  alert and oriented to person, place, and time.  Skin: Skin is warm and dry. No rash noted. She is not diaphoretic. No erythema. No pallor.  Psychiatric: She has a normal mood and affect. Her behavior is normal. Judgment and thought content normal.  Nursing note and vitals reviewed.    Appointment on 01/21/2019  Component Date Value Ref Range Status  . WBC 01/21/2019 9.0  4.0 - 10.5 K/uL Final  . RBC 01/21/2019 4.18  3.87 - 5.11 MIL/uL Final  . Hemoglobin 01/21/2019 12.4  12.0 - 15.0 g/dL Final  . HCT 01/21/2019 36.4  36.0 - 46.0 % Final  . MCV 01/21/2019 87.1  80.0 - 100.0 fL Final  . MCH 01/21/2019 29.7  26.0 - 34.0 pg Final  . MCHC 01/21/2019 34.1  30.0 - 36.0 g/dL Final  . RDW 01/21/2019 13.2  11.5 - 15.5 % Final  . Platelets 01/21/2019 189  150 - 400 K/uL Final  . nRBC 01/21/2019 0.0  0.0 - 0.2 % Final  . Neutrophils Relative % 01/21/2019 65  % Final  . Neutro Abs 01/21/2019 5.8  1.7 - 7.7 K/uL Final  . Lymphocytes Relative 01/21/2019 23  % Final  . Lymphs Abs 01/21/2019 2.1  0.7 - 4.0 K/uL Final  . Monocytes Relative 01/21/2019 9  % Final  . Monocytes Absolute 01/21/2019 0.8  0.1 - 1.0 K/uL Final  . Eosinophils Relative 01/21/2019 2  % Final  . Eosinophils Absolute 01/21/2019 0.2  0.0 - 0.5 K/uL Final  . Basophils Relative 01/21/2019 1  % Final  . Basophils Absolute 01/21/2019 0.1  0.0 - 0.1 K/uL Final  . Immature Granulocytes 01/21/2019 0  % Final  . Abs Immature Granulocytes 01/21/2019 0.03  0.00 - 0.07 K/uL Final   Performed at [facility] [place], [address], [place], [place] [address]  . Sodium 01/21/2019 139  135 - 145 mmol/L Final  . Potassium 01/21/2019 3.8  3.5 - 5.1 mmol/L Final  . Chloride 01/21/2019 105  98 - 111 mmol/L Final  . CO2 01/21/2019 24  22 - 32 mmol/L Final  . Glucose, Bld 01/21/2019 113* 70 - 99 mg/dL Final  . BUN 01/21/2019 39* 8 - 23 mg/dL Final  . Creatinine, Ser 01/21/2019 1.56* 0.44 - 1.00 mg/dL Final  . Calcium 01/21/2019 9.5  8.9 -  10.3 mg/dL Final  .  Total Protein 01/21/2019 7.2  6.5 - 8.1 g/dL Final  . Albumin 01/21/2019 4.0  3.5 - 5.0 g/dL Final  . AST 01/21/2019 21  15 - 41 U/L Final  . ALT 01/21/2019 16  0 - 44 U/L Final  . Alkaline Phosphatase 01/21/2019 73  38 - 126 U/L Final  . Total Bilirubin 01/21/2019 0.6  0.3 - 1.2 mg/dL Final  . GFR calc non Af Amer 01/21/2019 31* >60 mL/min Final  . GFR calc Af Amer 01/21/2019 37* >60 mL/min Final  . Anion gap 01/21/2019 10  5 - 15 Final   Performed at [facility], [address], [place], [place] [address]    Assessment:  [name] is a 78 y.o. female with iron deficiency anemia and stage III chronic kidney disease.  Work-up on 06/07/2017 revealed a hematocrit of 31.5, hemoglobin 10.3, MCV 87.5, platelets 144,000, white count 6200 with an [place] of 4100.  Retic was 1.2%.  Ferritin was 41.  Iron saturation was 10% with a TIBC of 387.  Normal studies included: B12 (657), folate, TSH, SPEP, free light chain ratio, and LDH. BUN was 54 with a creatinine of 1.96.    Colonoscopy on 02/03/2018 revealed diverticulosis in the sigmoid colon and in the descending colon.  There was one 3 mm polyp in the sigmoid colon and one 3 mm polyp in the descending colon.  There was melanosis in the colon and non-bleeding external and internal hemorrhoids.  Pathology revealed 2 tubular adenomas.  She has received Venofer on several occasions: weekly x4 (06/25/2017 - 07/16/2017), 08/19/2017, and 12/23/2017.    Ferritin has been followed: 41 on 05/28/2017, 179 on 12/19/2017, 220 on 06/18/2018, 200 on 10/21/2018, and 181 on 01/21/2019.  Symptomatically, she feels fine.  Energy level remains poor.  Exam is stable.  Plan: 1.   Labs today: CBC with diff, CMP, ferritin, iron studies. 2.   Iron deficiency anemia             Hematocrit 36.4.  Hemoglobin 12.4.  MCV 87.1.             Ferritin 181 with an iron saturation 19% and a TIBC 302.             Ferritin goal 100.  Discuss  cutting back on oral iron. 3.   Stage III chronic kidney disease             Hemoglobin > 10.             No Procrit needed. 4.   Discuss decreasing frequency of visits (every 4 months then 6 months if stable). 5.   RTC in 4 months for labs (CBC with diff, BMP, ferritin).   6.   RTC in 8 months for MD assessment and labs (CBC with diff, BMP, ferritin).   I discussed the assessment and treatment plan with the patient.  The patient was provided an opportunity to ask questions and all were answered.  The patient agreed with the plan and demonstrated an understanding of the instructions.  The patient was advised to call back if the symptoms worsen or if the condition fails to improve as anticipated.   Lequita Asal, MD, PhD    01/21/2019, 2:21 PM  I, Selena Batten, am acting as scribe for [employer]. Mike Gip, MD, PhD.  I, Melissa C. Mike Gip, MD, have reviewed the above documentation for accuracy and completeness, and I agree with the above.

## 2019-01-21 ENCOUNTER — Inpatient Hospital Stay: Payer: Medicare HMO

## 2019-01-21 ENCOUNTER — Other Ambulatory Visit: Payer: Self-pay

## 2019-01-21 ENCOUNTER — Inpatient Hospital Stay: Payer: Medicare HMO | Attending: Hematology and Oncology | Admitting: Hematology and Oncology

## 2019-01-21 ENCOUNTER — Encounter: Payer: Self-pay | Admitting: Hematology and Oncology

## 2019-01-21 VITALS — BP 113/65 | HR 70 | Temp 97.8°F | Resp 18 | Ht 64.5 in | Wt 204.6 lb

## 2019-01-21 DIAGNOSIS — D509 Iron deficiency anemia, unspecified: Secondary | ICD-10-CM

## 2019-01-21 DIAGNOSIS — D631 Anemia in chronic kidney disease: Secondary | ICD-10-CM

## 2019-01-21 DIAGNOSIS — D125 Benign neoplasm of sigmoid colon: Secondary | ICD-10-CM | POA: Diagnosis not present

## 2019-01-21 DIAGNOSIS — K648 Other hemorrhoids: Secondary | ICD-10-CM | POA: Insufficient documentation

## 2019-01-21 DIAGNOSIS — D124 Benign neoplasm of descending colon: Secondary | ICD-10-CM | POA: Insufficient documentation

## 2019-01-21 DIAGNOSIS — N183 Chronic kidney disease, stage 3 unspecified: Secondary | ICD-10-CM

## 2019-01-21 DIAGNOSIS — I129 Hypertensive chronic kidney disease with stage 1 through stage 4 chronic kidney disease, or unspecified chronic kidney disease: Secondary | ICD-10-CM | POA: Diagnosis not present

## 2019-01-21 DIAGNOSIS — Z79899 Other long term (current) drug therapy: Secondary | ICD-10-CM | POA: Diagnosis not present

## 2019-01-21 LAB — COMPREHENSIVE METABOLIC PANEL
ALT: 16 U/L (ref 0–44)
AST: 21 U/L (ref 15–41)
Albumin: 4 g/dL (ref 3.5–5.0)
Alkaline Phosphatase: 73 U/L (ref 38–126)
Anion gap: 10 (ref 5–15)
BUN: 39 mg/dL — ABNORMAL HIGH (ref 8–23)
CO2: 24 mmol/L (ref 22–32)
Calcium: 9.5 mg/dL (ref 8.9–10.3)
Chloride: 105 mmol/L (ref 98–111)
Creatinine, Ser: 1.56 mg/dL — ABNORMAL HIGH (ref 0.44–1.00)
GFR calc Af Amer: 37 mL/min — ABNORMAL LOW (ref >60)
GFR calc non Af Amer: 31 mL/min — ABNORMAL LOW (ref >60)
Glucose, Bld: 113 mg/dL — ABNORMAL HIGH (ref 70–99)
Potassium: 3.8 mmol/L (ref 3.5–5.1)
Sodium: 139 mmol/L (ref 135–145)
Total Bilirubin: 0.6 mg/dL (ref 0.3–1.2)
Total Protein: 7.2 g/dL (ref 6.5–8.1)

## 2019-01-21 LAB — IRON AND TIBC
Iron: 58 ug/dL (ref 28–170)
Saturation Ratios: 19 % (ref 10.4–31.8)
TIBC: 302 ug/dL (ref 250–450)
UIBC: 244 ug/dL

## 2019-01-21 LAB — CBC WITH DIFFERENTIAL/PLATELET
Abs Immature Granulocytes: 0.03 10*3/uL (ref 0.00–0.07)
Basophils Absolute: 0.1 10*3/uL (ref 0.0–0.1)
Basophils Relative: 1 %
Eosinophils Absolute: 0.2 10*3/uL (ref 0.0–0.5)
Eosinophils Relative: 2 %
HCT: 36.4 % (ref 36.0–46.0)
Hemoglobin: 12.4 g/dL (ref 12.0–15.0)
Immature Granulocytes: 0 %
Lymphocytes Relative: 23 %
Lymphs Abs: 2.1 10*3/uL (ref 0.7–4.0)
MCH: 29.7 pg (ref 26.0–34.0)
MCHC: 34.1 g/dL (ref 30.0–36.0)
MCV: 87.1 fL (ref 80.0–100.0)
Monocytes Absolute: 0.8 10*3/uL (ref 0.1–1.0)
Monocytes Relative: 9 %
Neutro Abs: 5.8 10*3/uL (ref 1.7–7.7)
Neutrophils Relative %: 65 %
Platelets: 189 10*3/uL (ref 150–400)
RBC: 4.18 MIL/uL (ref 3.87–5.11)
RDW: 13.2 % (ref 11.5–15.5)
WBC: 9 10*3/uL (ref 4.0–10.5)
nRBC: 0 % (ref 0.0–0.2)

## 2019-01-21 LAB — FERRITIN: Ferritin: 181 ng/mL (ref 11–307)

## 2019-01-21 NOTE — Progress Notes (Signed)
Patient complains of bilateral arthritic knee pain, 5/10.

## 2019-02-02 DIAGNOSIS — N2581 Secondary hyperparathyroidism of renal origin: Secondary | ICD-10-CM | POA: Insufficient documentation

## 2019-05-20 ENCOUNTER — Other Ambulatory Visit: Payer: Self-pay

## 2019-05-21 ENCOUNTER — Inpatient Hospital Stay: Payer: Medicare HMO | Attending: Hematology and Oncology

## 2019-05-21 DIAGNOSIS — N183 Chronic kidney disease, stage 3 unspecified: Secondary | ICD-10-CM | POA: Insufficient documentation

## 2019-05-21 DIAGNOSIS — D631 Anemia in chronic kidney disease: Secondary | ICD-10-CM | POA: Insufficient documentation

## 2019-05-21 DIAGNOSIS — D509 Iron deficiency anemia, unspecified: Secondary | ICD-10-CM | POA: Insufficient documentation

## 2019-05-21 LAB — CBC WITH DIFFERENTIAL/PLATELET
Abs Immature Granulocytes: 0.02 10*3/uL (ref 0.00–0.07)
Basophils Absolute: 0.1 10*3/uL (ref 0.0–0.1)
Basophils Relative: 1 %
Eosinophils Absolute: 0.2 10*3/uL (ref 0.0–0.5)
Eosinophils Relative: 2 %
HCT: 37.4 % (ref 36.0–46.0)
Hemoglobin: 12.5 g/dL (ref 12.0–15.0)
Immature Granulocytes: 0 %
Lymphocytes Relative: 25 %
Lymphs Abs: 2.4 10*3/uL (ref 0.7–4.0)
MCH: 29 pg (ref 26.0–34.0)
MCHC: 33.4 g/dL (ref 30.0–36.0)
MCV: 86.8 fL (ref 80.0–100.0)
Monocytes Absolute: 0.7 10*3/uL (ref 0.1–1.0)
Monocytes Relative: 7 %
Neutro Abs: 6.1 10*3/uL (ref 1.7–7.7)
Neutrophils Relative %: 65 %
Platelets: 199 10*3/uL (ref 150–400)
RBC: 4.31 MIL/uL (ref 3.87–5.11)
RDW: 13 % (ref 11.5–15.5)
WBC: 9.5 10*3/uL (ref 4.0–10.5)
nRBC: 0 % (ref 0.0–0.2)

## 2019-05-21 LAB — BASIC METABOLIC PANEL
Anion gap: 11 (ref 5–15)
BUN: 33 mg/dL — ABNORMAL HIGH (ref 8–23)
CO2: 21 mmol/L — ABNORMAL LOW (ref 22–32)
Calcium: 10 mg/dL (ref 8.9–10.3)
Chloride: 108 mmol/L (ref 98–111)
Creatinine, Ser: 1.47 mg/dL — ABNORMAL HIGH (ref 0.44–1.00)
GFR calc Af Amer: 39 mL/min — ABNORMAL LOW (ref >60)
GFR calc non Af Amer: 34 mL/min — ABNORMAL LOW (ref >60)
Glucose, Bld: 98 mg/dL (ref 70–99)
Potassium: 3.7 mmol/L (ref 3.5–5.1)
Sodium: 140 mmol/L (ref 135–145)

## 2019-05-21 LAB — FERRITIN: Ferritin: 218 ng/mL (ref 11–307)

## 2019-05-24 ENCOUNTER — Other Ambulatory Visit: Payer: Medicare HMO

## 2019-06-19 ENCOUNTER — Other Ambulatory Visit: Payer: Self-pay | Admitting: Nurse Practitioner

## 2019-09-20 NOTE — Progress Notes (Signed)
[facility]  [address] [place], [place] [address] Phone: [phone]  Fax: [phone]   Clinic Day:  09/21/2019  Referring physician: Jodi Marble, MD  Chief Complaint: [name] is a 79 y.o. 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  HPI: The patient was last seen in the hematology clinic on 01/21/2019. At that time, she felt fine.  Energy level remained poor.  Exam was stable.  Hematocrit was 36.4, hemoglobin 12.4, MCV 87.1, platelets 189,000, WBC 9000, ANC 5800. BUN was 39, creatinine 1.56.  Ferritin was 181 with an iron saturation of 19% and a TIBC of 302.  Oral iron was decreased.  She is being followed by Dr. Holley Raring for her kidneys. She is currently stable with a GFR of 29 ml/min and will follow up again with him around 01/10/2020.   During the interim, she has been well overall. She is taking 65 mg of iron once per day. She states that she is eating well.   She has not yet gotten her COVID-19 vaccine, but she is interested in getting it.    Past Medical History:  Diagnosis Date  . Arthritis    "hands, feet, knees, shoulders" (05/05/2014)  . Bipolar disorder ([place])   . Cataracts, bilateral    immature  . Chronic kidney disease    "dr says they aren't functioning like they should" (05/05/2014)  . Coronary artery disease   . Depression    takes Zoloft daily  . History of bronchitis 67yrs ago  . Hyperlipidemia    takes Atorvastatin daily  . Hypertension    takes Metoprolol,Lisinopril,and Apresoline daily  . Joint pain   . Peripheral edema    takes Chlothalidone daily  . 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 Squamous cell cancer of skin of nose   . Type II diabetes mellitus ([place])    takes Tradjenta daily    Past Surgical History:  Procedure Laterality Date  . COLONOSCOPY WITH PROPOFOL N/A 02/03/2018   Procedure:  COLONOSCOPY WITH PROPOFOL;  Surgeon: Lollie Sails, MD;  Location: [facility];  Service: Endoscopy;  Laterality: N/A;  . CORONARY ARTERY BYPASS GRAFT  05/12/2007   "CABG X1; at [facility]"  . coronary artery bypass with arterial grafts    . DILATION AND CURETTAGE OF UTERUS  2005  . FRACTURE SURGERY    . I & D EXTREMITY Right 04/30/2014   Procedure: IRRIGATION AND DEBRIDEMENT EXTREMITY;  Surgeon: Wylene Simmer, MD;  Location: [place];  Service: Orthopedics;  Laterality: Right;  . INCISION AND DRAINAGE Right 08/11/2014   Procedure: REMOVAL OF DEEP IMPLANTS OF RIGHT TIBIAL FIBIAL INCISION AND DRAINAGE OF RIGHT ANKLE WOUND;  Surgeon: Wylene Simmer, MD;  Location: [place];  Service: Orthopedics;  Laterality: Right;  . ORIF ANKLE FRACTURE Right 04/30/2014   Procedure: OPEN REDUCTION INTERNAL FIXATION (ORIF) ANKLE FRACTURE;  Surgeon: Wylene Simmer, MD;  Location: [place];  Service: Orthopedics;  Laterality: Right;  . REMOVAL OF IMPLANT Right 08/11/2014   Procedure: REMOVAL OF DEEP IMPLANTS OF RIGHT TIBIAL FIBIAL INCISION AND DRAINAGE OF RIGHT ANKLE WOUND;  Surgeon: Wylene Simmer, MD;  Location: [place];  Service: Orthopedics;  Laterality: Right;  . SQUAMOUS CELL CARCINOMA EXCISION Right 2012   "nose"  . VAGINAL HYSTERECTOMY  2007    History reviewed. No pertinent family history.  Social History:  reports that she  has never smoked. She has never used smokeless tobacco. She reports that she does not drink alcohol or use drugs. 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 She lives in [place]. The patient is alone today.   Allergies:  Allergies  Allergen Reactions  . Codeine Nausea And Vomiting    "Makes me deathly sick"    Current Medications: Current Outpatient Medications  Medication Sig Dispense Refill  . amLODipine (NORVASC) 10 MG tablet Take 10 mg by mouth daily.    Marland Kitchen aspirin EC 325 MG tablet Take 325 mg by mouth daily.    . cetirizine (ZYRTEC) 10 MG tablet Take 10 mg by mouth daily.     .  chlorthalidone (HYGROTON) 25 MG tablet Take 25 mg by mouth daily.   2  . ferrous sulfate 325 (65 FE) MG tablet Take 1 tablet by mouth daily.    . fluticasone (FLONASE) 50 MCG/ACT nasal spray Place 1 spray into both nostrils daily.    . hydrALAZINE (APRESOLINE) 50 MG tablet Take 50 mg by mouth 3 (three) times daily.   2  . lisinopril (PRINIVIL,ZESTRIL) 40 MG tablet Take 40 mg by mouth daily.   2  . rosuvastatin (CRESTOR) 40 MG tablet Take by mouth.    . sertraline (ZOLOFT) 50 MG tablet Take 50 mg by mouth daily.     . traMADol (ULTRAM) 50 MG tablet Comments:   Filled Date: Jul 05 2016 12:00AM  Patient Notes: TK 1 TO 2 TS PO D PRN  Duration: 30    . Calcium Carb-Cholecalciferol (CALTRATE 600+D3 SOFT) 600-800 MG-UNIT CHEW Chew by mouth.    . Cholecalciferol (VITAMIN D3) 5000 units TABS Take 1 tablet by mouth daily.    . diclofenac sodium (VOLTAREN) 1 % GEL Apply 2 g topically as needed.     . metoprolol succinate (TOPROL-XL) 50 MG 24 hr tablet Take 50 mg by mouth daily.     . traMADol-acetaminophen (ULTRACET) 37.5-325 MG per tablet Take 1 tablet by mouth every 6 (six) hours as needed.     No current facility-administered medications for this visit.    Review of Systems  Constitutional: Positive for malaise/fatigue. Negative for chills, diaphoresis, fever and weight loss (up 4 lbs).       Feels "fine".   HENT: Positive for hearing loss (decreased). Negative for congestion, ear pain, nosebleeds, sinus pain, sore throat and tinnitus.   Eyes: Negative.  Negative for blurred vision and double vision.  Respiratory: Negative.  Negative for cough, hemoptysis, sputum production and shortness of breath.   Cardiovascular: Negative.  Negative for chest pain, palpitations and leg swelling.  Gastrointestinal: Negative.  Negative for abdominal pain, blood in stool, constipation, diarrhea, heartburn, melena, nausea and vomiting.       Eating well.  Genitourinary: Negative.  Negative for dysuria, frequency,  hematuria and urgency.  Musculoskeletal: Positive for joint pain (arthritis in knees, on tramadol). Negative for back pain, myalgias and neck pain.  Skin: Negative.  Negative for itching and rash.  Neurological: Negative.  Negative for dizziness, tingling, sensory change, focal weakness, weakness and headaches.  Endo/Heme/Allergies: Negative.  Does not bruise/bleed easily.       Hemoglobin A1c is 5.7.  Psychiatric/Behavioral: Negative for depression and memory loss. The patient is not nervous/anxious and does not have insomnia.        Bipolar disorder.  All other systems reviewed and are negative.  Performance status (ECOG): 1 - Symptomatic but completely ambulatory   Vitals Blood pressure Marland Kitchen)  146/60, pulse 81, temperature (!) 96.6 F (35.9 C), temperature source Tympanic, resp. rate 18, weight 209 lb 3.5 oz (94.9 kg), SpO2 97 %.  Physical Exam Vitals and nursing note reviewed.  Constitutional:      General: She is not in acute distress.    Appearance: She is well-developed and well-nourished. She is not diaphoretic.     Comments: She has a rolling walker at her side.  HENT:     Head: Normocephalic and atraumatic.     Mouth/Throat:     Mouth: Oropharynx is clear and moist.     Pharynx: No oropharyngeal exudate.      Comments: Long gray hair.  Mask. Eyes:     General: No scleral icterus.    Extraocular Movements: EOM normal.     Conjunctiva/sclera: Conjunctivae normal.     Pupils: Pupils are equal, round, and reactive to light.     Comments: Brown/hazel eyes.  Neck:     Vascular: No JVD.  Cardiovascular:     Rate and Rhythm: Normal rate and regular rhythm.     Heart sounds: Normal heart sounds. No murmur heard.   Pulmonary:     Effort: Pulmonary effort is normal. No respiratory distress.     Breath sounds: Normal breath sounds. No wheezing or rales.  Chest:     Chest wall: No tenderness.  Abdominal:     General: Bowel sounds are normal. There is no distension.      Palpations: Abdomen is soft. There is no mass.     Tenderness: There is no abdominal tenderness. There is no guarding or rebound.  Musculoskeletal:        General: Tenderness (lower legs) present. No edema.     Cervical back: Normal range of motion and neck supple.     Comments: Chronic lower extremity changes.  Lymphadenopathy:     Head:     Right side of head: No preauricular, posterior auricular or occipital adenopathy.     Left side of head: No preauricular, posterior auricular or occipital adenopathy.     Cervical: No cervical adenopathy.     Upper Body:  No axillary adenopathy present.    Right upper body: No supraclavicular adenopathy.     Left upper body: No supraclavicular adenopathy.     Lower Body: No right inguinal adenopathy. No left inguinal adenopathy.  Skin:    General: Skin is warm and dry.     Coloration: Skin is not pale.     Findings: No erythema or rash.  Neurological:     Mental Status: She is alert and oriented to person, place, and time.  Psychiatric:        Mood and Affect: Mood and affect normal.        Behavior: Behavior normal.        Thought Content: Thought content normal.        Judgment: Judgment normal.    Appointment on 09/21/2019  Component Date Value Ref Range Status  . Sodium 09/21/2019 139  135 - 145 mmol/L Final  . Potassium 09/21/2019 4.2  3.5 - 5.1 mmol/L Final  . Chloride 09/21/2019 108  98 - 111 mmol/L Final  . CO2 09/21/2019 22  22 - 32 mmol/L Final  . Glucose, Bld 09/21/2019 108* 70 - 99 mg/dL Final   Glucose reference range applies only to samples taken after fasting for at least 8 hours.  . BUN 09/21/2019 39* 8 - 23 mg/dL Final  . Creatinine, Ser 09/21/2019 1.71*  0.44 - 1.00 mg/dL Final  . Calcium 09/21/2019 9.7  8.9 - 10.3 mg/dL Final  . GFR calc non Af Amer 09/21/2019 28* >60 mL/min Final  . GFR calc Af Amer 09/21/2019 32* >60 mL/min Final  . Anion gap 09/21/2019 9  5 - 15 Final   Performed at [facility],  [address], [place], [place] [address]  . WBC 09/21/2019 9.8  4.0 - 10.5 K/uL Final  . RBC 09/21/2019 4.18  3.87 - 5.11 MIL/uL Final  . Hemoglobin 09/21/2019 12.2  12.0 - 15.0 g/dL Final  . HCT 09/21/2019 36.5  36.0 - 46.0 % Final  . MCV 09/21/2019 87.3  80.0 - 100.0 fL Final  . MCH 09/21/2019 29.2  26.0 - 34.0 pg Final  . MCHC 09/21/2019 33.4  30.0 - 36.0 g/dL Final  . RDW 09/21/2019 13.4  11.5 - 15.5 % Final  . Platelets 09/21/2019 171  150 - 400 K/uL Final  . nRBC 09/21/2019 0.0  0.0 - 0.2 % Final  . Neutrophils Relative % 09/21/2019 65  % Final  . Neutro Abs 09/21/2019 6.3  1.7 - 7.7 K/uL Final  . Lymphocytes Relative 09/21/2019 25  % Final  . Lymphs Abs 09/21/2019 2.5  0.7 - 4.0 K/uL Final  . Monocytes Relative 09/21/2019 8  % Final  . Monocytes Absolute 09/21/2019 0.8  0.1 - 1.0 K/uL Final  . Eosinophils Relative 09/21/2019 2  % Final  . Eosinophils Absolute 09/21/2019 0.2  0.0 - 0.5 K/uL Final  . Basophils Relative 09/21/2019 0  % Final  . Basophils Absolute 09/21/2019 0.0  0.0 - 0.1 K/uL Final  . Immature Granulocytes 09/21/2019 0  % Final  . Abs Immature Granulocytes 09/21/2019 0.03  0.00 - 0.07 K/uL Final   Performed at [facility], [address] [place],  [address]    Assessment:  [name] is a 79 y.o. female with iron deficiency anemia and stage III chronic kidney disease.  Work-up on 06/07/2017 revealed a hematocrit of 31.5, hemoglobin 10.3, MCV 87.5, platelets 144,000, white count 6200 with an [place] of 4100.  Retic was 1.2%.  Ferritin was 41.  Iron saturation was 10% with a TIBC of 387.  Normal studies included: B12 (657), folate, TSH, SPEP, free light chain ratio, and LDH. BUN was 54 with a creatinine of 1.96.    Colonoscopy on 02/03/2018 revealed diverticulosis in the sigmoid colon and in the descending colon.  There was one 3 mm polyp in the sigmoid colon and one 3 mm polyp in the descending colon.  There was melanosis in the colon and  non-bleeding external and internal hemorrhoids.  Pathology revealed 2 tubular adenomas.  She has received Venofer on several occasions: weekly x4 (06/25/2017 - 07/16/2017), 08/19/2017, and 12/23/2017.    Ferritin has been followed: 41 on 05/28/2017, 179 on 12/19/2017, 220 on 06/18/2018, 200 on 10/21/2018, 181 on 01/21/2019, 218 on 05/21/2019, and 189 on 09/21/2019.  She has not yet gotten her COVID-19 vaccine, but she is interested in getting it.   Symptomatically, she feels "fine".  She is eating well.  She is on oral iron.  Plan: 1.   Labs today: CBC with diff, BMP, ferritin. 2.   Iron deficiency anemia             Hematocrit 36.5.  Hemoglobin 12.2.  MCV 87.3.             Ferritin 189.  Ferritin goal 100.  Discuss cutting back on oral iron. 3.   Stage III chronic kidney disease             Hemoglobin > 10.             No Retacrit needed. 4.   RTC in 6 months for labs (CBC with differential, ferritin).   5.   RTC in 12 months for MD assessment and labs (CBC with diff, BMP, ferritin).    I discussed the assessment and treatment plan with the patient.  The patient was provided an opportunity to ask questions and all were answered.  The patient agreed with the plan and demonstrated an understanding of the instructions.  The patient was advised to call back if the symptoms worsen or if the condition fails to improve as anticipated.    Lequita Asal, MD, PhD    09/21/2019, 11:02 AM  I, Jacqualyn Posey, am acting as a Education administrator for [employer]. Mike Gip, MD.   I, Dmarion Perfect C. Mike Gip, MD, have reviewed the above documentation for accuracy and completeness, and I agree with the above.

## 2019-09-21 ENCOUNTER — Encounter: Payer: Self-pay | Admitting: Hematology and Oncology

## 2019-09-21 ENCOUNTER — Inpatient Hospital Stay: Payer: Medicare HMO | Admitting: Hematology and Oncology

## 2019-09-21 ENCOUNTER — Other Ambulatory Visit: Payer: Self-pay

## 2019-09-21 ENCOUNTER — Inpatient Hospital Stay: Payer: Medicare HMO | Attending: Hematology and Oncology

## 2019-09-21 VITALS — BP 146/60 | HR 81 | Temp 96.6°F | Resp 18 | Wt 209.2 lb

## 2019-09-21 DIAGNOSIS — E1122 Type 2 diabetes mellitus with diabetic chronic kidney disease: Secondary | ICD-10-CM | POA: Insufficient documentation

## 2019-09-21 DIAGNOSIS — E785 Hyperlipidemia, unspecified: Secondary | ICD-10-CM | POA: Insufficient documentation

## 2019-09-21 DIAGNOSIS — K648 Other hemorrhoids: Secondary | ICD-10-CM | POA: Insufficient documentation

## 2019-09-21 DIAGNOSIS — D631 Anemia in chronic kidney disease: Secondary | ICD-10-CM

## 2019-09-21 DIAGNOSIS — Z79899 Other long term (current) drug therapy: Secondary | ICD-10-CM | POA: Diagnosis not present

## 2019-09-21 DIAGNOSIS — M1990 Unspecified osteoarthritis, unspecified site: Secondary | ICD-10-CM | POA: Insufficient documentation

## 2019-09-21 DIAGNOSIS — I129 Hypertensive chronic kidney disease with stage 1 through stage 4 chronic kidney disease, or unspecified chronic kidney disease: Secondary | ICD-10-CM | POA: Diagnosis present

## 2019-09-21 DIAGNOSIS — D125 Benign neoplasm of sigmoid colon: Secondary | ICD-10-CM | POA: Insufficient documentation

## 2019-09-21 DIAGNOSIS — D124 Benign neoplasm of descending colon: Secondary | ICD-10-CM | POA: Insufficient documentation

## 2019-09-21 DIAGNOSIS — K5730 Diverticulosis of large intestine without perforation or abscess without bleeding: Secondary | ICD-10-CM | POA: Diagnosis not present

## 2019-09-21 DIAGNOSIS — N183 Chronic kidney disease, stage 3 unspecified: Secondary | ICD-10-CM

## 2019-09-21 DIAGNOSIS — Z7982 Long term (current) use of aspirin: Secondary | ICD-10-CM | POA: Insufficient documentation

## 2019-09-21 DIAGNOSIS — D509 Iron deficiency anemia, unspecified: Secondary | ICD-10-CM | POA: Diagnosis not present

## 2019-09-21 LAB — CBC WITH DIFFERENTIAL/PLATELET
Abs Immature Granulocytes: 0.03 10*3/uL (ref 0.00–0.07)
Basophils Absolute: 0 10*3/uL (ref 0.0–0.1)
Basophils Relative: 0 %
Eosinophils Absolute: 0.2 10*3/uL (ref 0.0–0.5)
Eosinophils Relative: 2 %
HCT: 36.5 % (ref 36.0–46.0)
Hemoglobin: 12.2 g/dL (ref 12.0–15.0)
Immature Granulocytes: 0 %
Lymphocytes Relative: 25 %
Lymphs Abs: 2.5 10*3/uL (ref 0.7–4.0)
MCH: 29.2 pg (ref 26.0–34.0)
MCHC: 33.4 g/dL (ref 30.0–36.0)
MCV: 87.3 fL (ref 80.0–100.0)
Monocytes Absolute: 0.8 10*3/uL (ref 0.1–1.0)
Monocytes Relative: 8 %
Neutro Abs: 6.3 10*3/uL (ref 1.7–7.7)
Neutrophils Relative %: 65 %
Platelets: 171 10*3/uL (ref 150–400)
RBC: 4.18 MIL/uL (ref 3.87–5.11)
RDW: 13.4 % (ref 11.5–15.5)
WBC: 9.8 10*3/uL (ref 4.0–10.5)
nRBC: 0 % (ref 0.0–0.2)

## 2019-09-21 LAB — BASIC METABOLIC PANEL
Anion gap: 9 (ref 5–15)
BUN: 39 mg/dL — ABNORMAL HIGH (ref 8–23)
CO2: 22 mmol/L (ref 22–32)
Calcium: 9.7 mg/dL (ref 8.9–10.3)
Chloride: 108 mmol/L (ref 98–111)
Creatinine, Ser: 1.71 mg/dL — ABNORMAL HIGH (ref 0.44–1.00)
GFR calc Af Amer: 32 mL/min — ABNORMAL LOW (ref >60)
GFR calc non Af Amer: 28 mL/min — ABNORMAL LOW (ref >60)
Glucose, Bld: 108 mg/dL — ABNORMAL HIGH (ref 70–99)
Potassium: 4.2 mmol/L (ref 3.5–5.1)
Sodium: 139 mmol/L (ref 135–145)

## 2019-09-21 LAB — FERRITIN: Ferritin: 189 ng/mL (ref 11–307)

## 2019-10-06 IMAGING — MR MR HEAD W/O CM
8 of 10 series · 37 of 48 positions shown · non-contrast
Comparison: Head CT 04/30/2014

CLINICAL DATA: Right-sided hearing loss, gradual, present for 2
years.

EXAM:
MRI HEAD WITHOUT CONTRAST
TECHNIQUE: Multiplanar, multiecho pulse sequences of the brain and surrounding
structures were obtained without intravenous contrast.

[Series 3: DWI · axial · 3.0mm · 1.20mm/px · z∈[-86,+82]mm · 7 of 57 slices shown (1 of 2)]
[im 1/57]
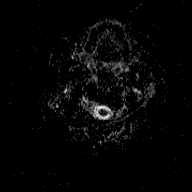
[im 10/57]
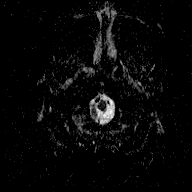
[im 19/57]
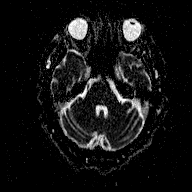
[im 29/57]
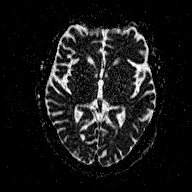
[im 38/57]
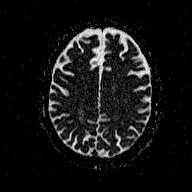
[im 47/57]
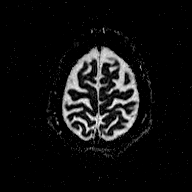
[im 57/57]
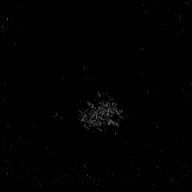

[Series 4: T1 · sagittal · 5.0mm · 0.45mm/px · 3 of 25 slices shown (1 of 2)]
[im 1/25]
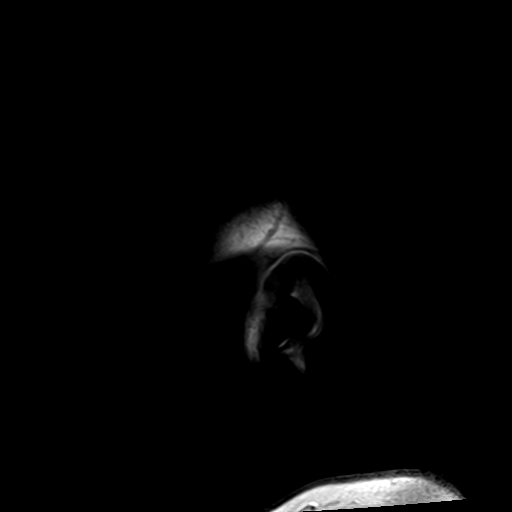
[im 13/25]
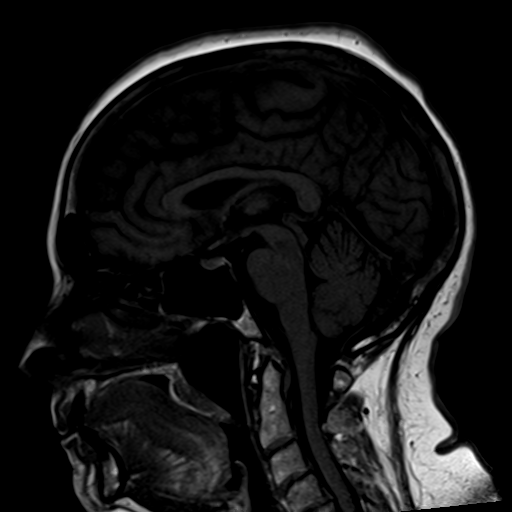
[im 25/25]
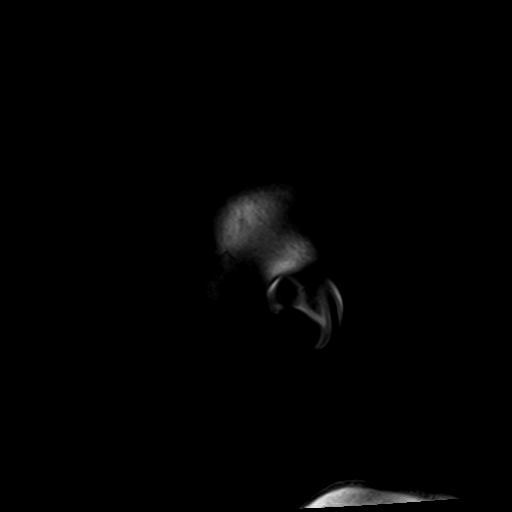

[Series 5: T2 · axial · 5.0mm · 0.72mm/px · z∈[-81,+75]mm · 4 of 25 slices shown (1 of 3)]
[im 1/25]
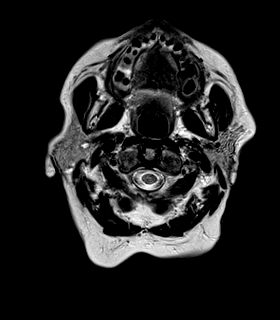
[im 9/25]
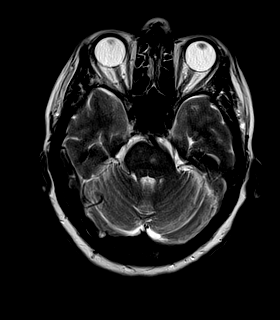
[im 17/25]
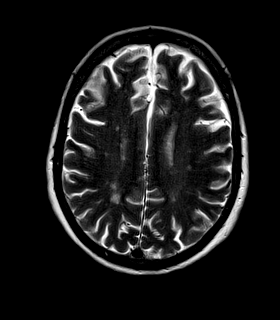
[im 25/25]
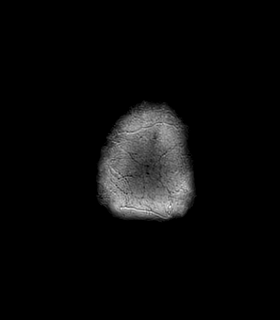

[Series 6: T2 · axial · 5.0mm · 0.72mm/px · z∈[-81,+75]mm · 4 of 25 slices shown (2 of 3)]
[im 1/25]
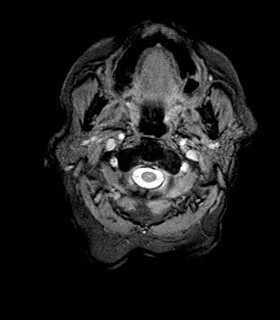
[im 9/25]
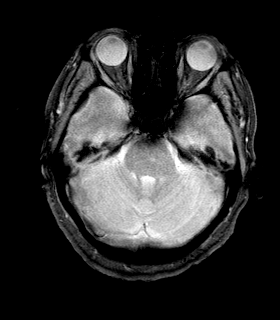
[im 17/25]
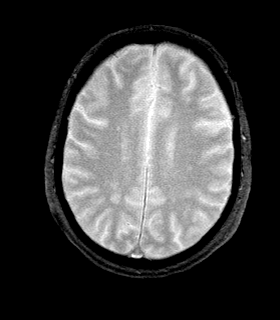
[im 25/25]
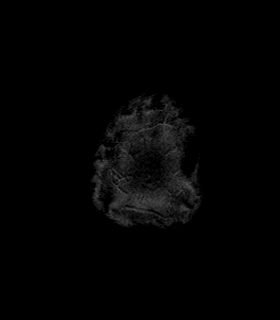

[Series 7: T1 · coronal · 3.0mm · 0.37mm/px · 2 of 11 slices shown (2 of 2)]
[im 1/11]
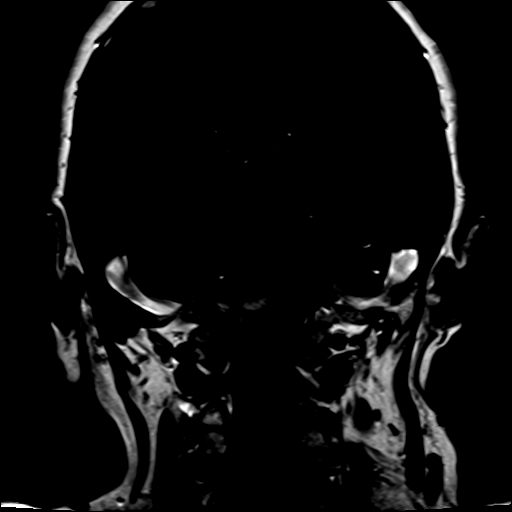
[im 11/11]
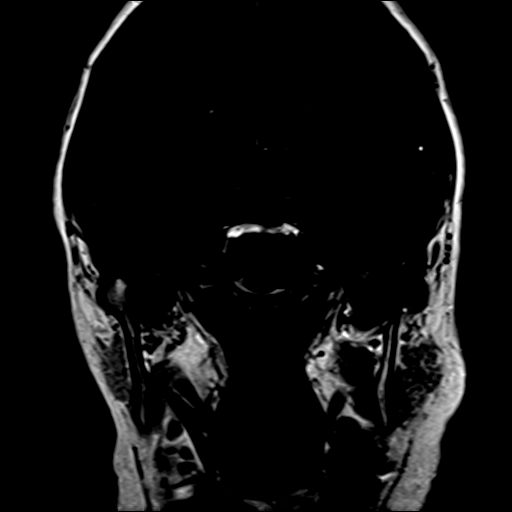

[Series 8: FLAIR · axial · 5.0mm · 0.45mm/px · z∈[-81,+75]mm · 4 of 25 slices shown]
[im 1/25]
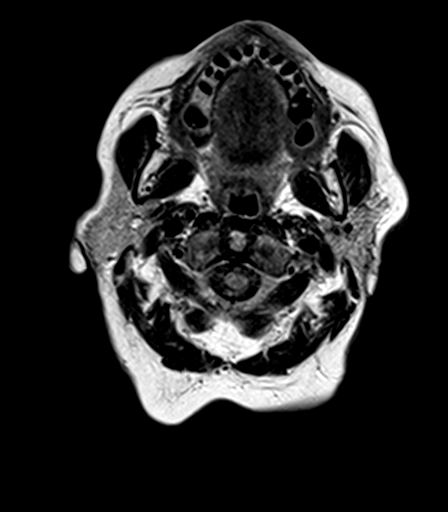
[im 9/25]
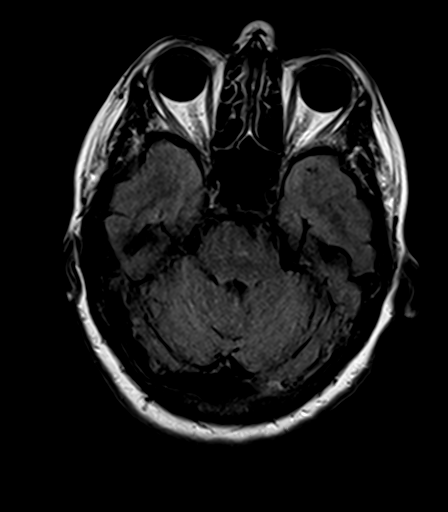
[im 17/25]
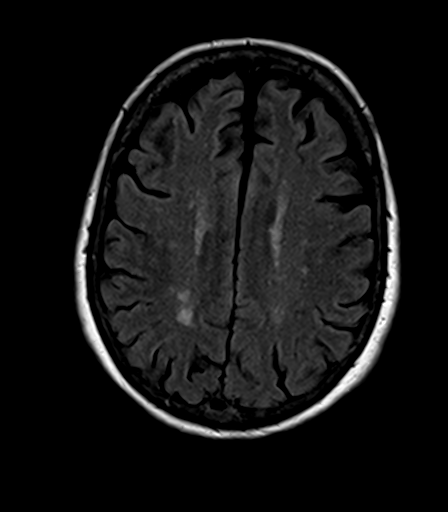
[im 25/25]
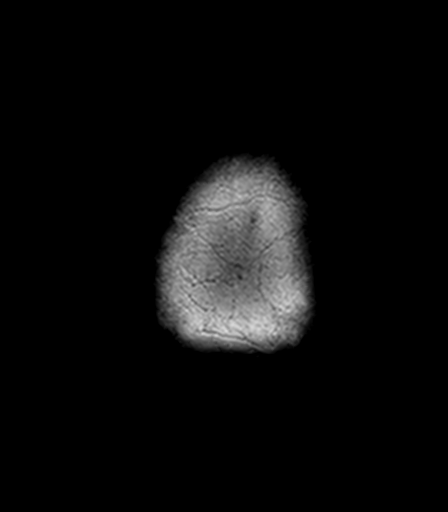

[Series 12: T2 · coronal · 5.0mm · 0.43mm/px · 5 of 31 slices shown (3 of 3)]
[im 1/31]
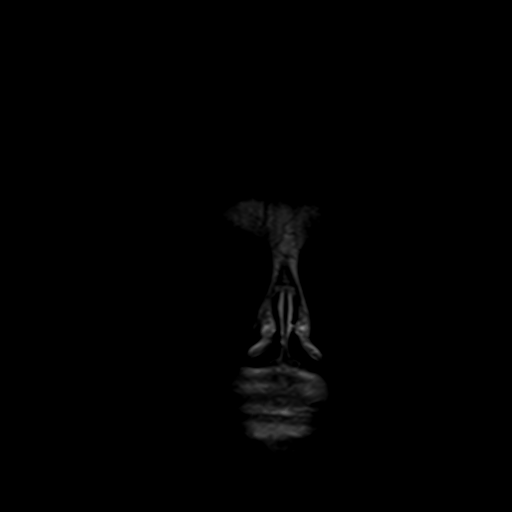
[im 8/31]
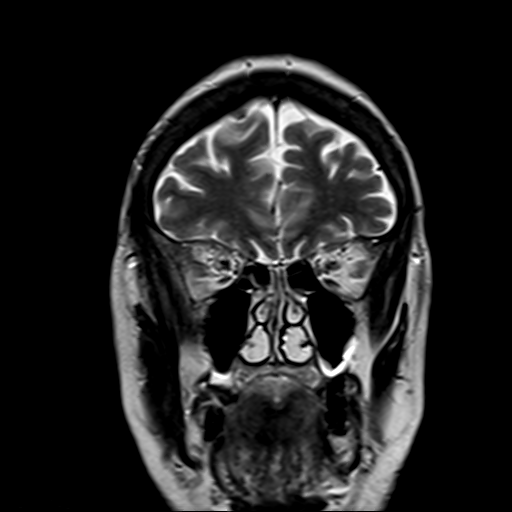
[im 16/31]
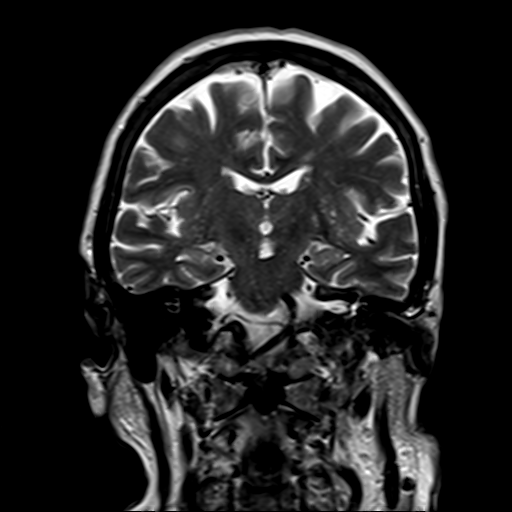
[im 23/31]
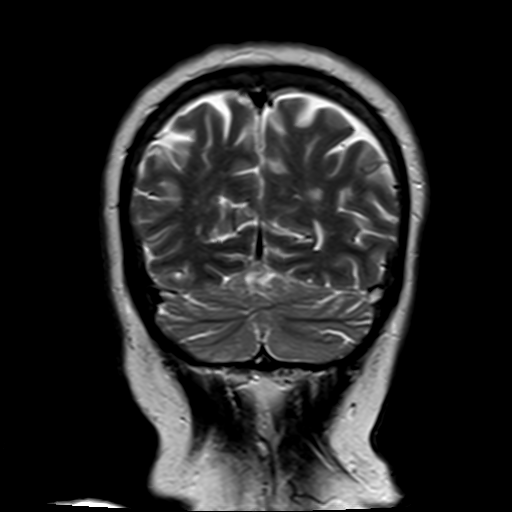
[im 31/31]
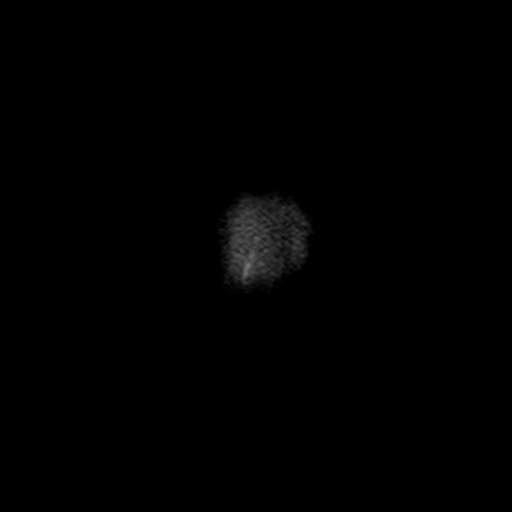

[Series 100: DWI · axial · 3.0mm · 1.20mm/px · z∈[-83,+82]mm · 8 of 56 slices shown (2 of 2)]
[im 1/56]
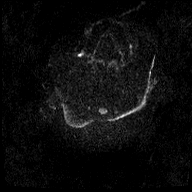
[im 8/56]
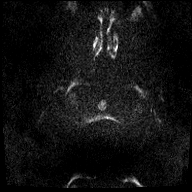
[im 16/56]
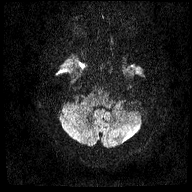
[im 24/56]
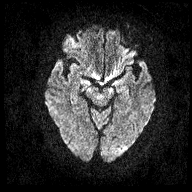
[im 32/56]
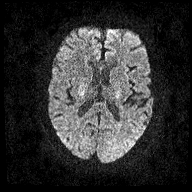
[im 40/56]
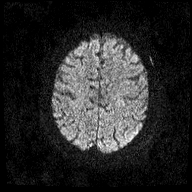
[im 48/56]
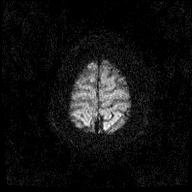
[im 56/56]
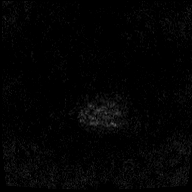

[37 of 48 positions shown; findings below may reference images not displayed]

FINDINGS: Brain: No finding to explain right hearing loss. There is no focal
abnormality in the right brainstem, cisterns, or labyrinth. The
vestibulocochlear nerves have a symmetric normal appearance

Mild chronic small vessel ischemic type change in the cerebral white
matter. There has been a remote lacunar infarct above the left
putamen. Age normal brain volume. No incidental acute infarct. No
blood products, hydrocephalus, or masslike finding.

Vascular: Asymmetric flow artifact at the left transverse sigmoid
sinus with patent vessel at the level of the jugular foramen.

Skull and upper cervical spine: No focal marrow lesion. Cervical
disc narrowing.

Sinuses/Orbits: Mild mucosal thickening in the left maxillary sinus.
Negative orbits.

Other: Contrast was not administered due to low GFR (31)
IMPRESSION: 1. Negative for retrocochlear lesion.
2. Contrast not administered due to low GFR.
3. Senescent changes described above.

## 2020-03-22 ENCOUNTER — Other Ambulatory Visit: Payer: Self-pay

## 2020-03-22 ENCOUNTER — Inpatient Hospital Stay: Payer: Medicare HMO | Attending: Hematology and Oncology

## 2020-03-22 DIAGNOSIS — D124 Benign neoplasm of descending colon: Secondary | ICD-10-CM | POA: Insufficient documentation

## 2020-03-22 DIAGNOSIS — E1122 Type 2 diabetes mellitus with diabetic chronic kidney disease: Secondary | ICD-10-CM | POA: Insufficient documentation

## 2020-03-22 DIAGNOSIS — D509 Iron deficiency anemia, unspecified: Secondary | ICD-10-CM

## 2020-03-22 DIAGNOSIS — Z7982 Long term (current) use of aspirin: Secondary | ICD-10-CM | POA: Insufficient documentation

## 2020-03-22 DIAGNOSIS — E785 Hyperlipidemia, unspecified: Secondary | ICD-10-CM | POA: Insufficient documentation

## 2020-03-22 DIAGNOSIS — K5730 Diverticulosis of large intestine without perforation or abscess without bleeding: Secondary | ICD-10-CM | POA: Insufficient documentation

## 2020-03-22 DIAGNOSIS — N183 Chronic kidney disease, stage 3 unspecified: Secondary | ICD-10-CM | POA: Diagnosis not present

## 2020-03-22 DIAGNOSIS — M1990 Unspecified osteoarthritis, unspecified site: Secondary | ICD-10-CM | POA: Diagnosis not present

## 2020-03-22 DIAGNOSIS — I129 Hypertensive chronic kidney disease with stage 1 through stage 4 chronic kidney disease, or unspecified chronic kidney disease: Secondary | ICD-10-CM | POA: Diagnosis not present

## 2020-03-22 DIAGNOSIS — D125 Benign neoplasm of sigmoid colon: Secondary | ICD-10-CM | POA: Diagnosis not present

## 2020-03-22 DIAGNOSIS — K648 Other hemorrhoids: Secondary | ICD-10-CM | POA: Insufficient documentation

## 2020-03-22 DIAGNOSIS — D631 Anemia in chronic kidney disease: Secondary | ICD-10-CM | POA: Insufficient documentation

## 2020-03-22 DIAGNOSIS — Z79899 Other long term (current) drug therapy: Secondary | ICD-10-CM | POA: Insufficient documentation

## 2020-03-22 LAB — CBC WITH DIFFERENTIAL/PLATELET
Abs Immature Granulocytes: 0.02 10*3/uL (ref 0.00–0.07)
Basophils Absolute: 0.1 10*3/uL (ref 0.0–0.1)
Basophils Relative: 1 %
Eosinophils Absolute: 0.2 10*3/uL (ref 0.0–0.5)
Eosinophils Relative: 2 %
HCT: 37 % (ref 36.0–46.0)
Hemoglobin: 12.1 g/dL (ref 12.0–15.0)
Immature Granulocytes: 0 %
Lymphocytes Relative: 32 %
Lymphs Abs: 2.5 10*3/uL (ref 0.7–4.0)
MCH: 28.7 pg (ref 26.0–34.0)
MCHC: 32.7 g/dL (ref 30.0–36.0)
MCV: 87.9 fL (ref 80.0–100.0)
Monocytes Absolute: 0.7 10*3/uL (ref 0.1–1.0)
Monocytes Relative: 8 %
Neutro Abs: 4.5 10*3/uL (ref 1.7–7.7)
Neutrophils Relative %: 57 %
Platelets: 195 10*3/uL (ref 150–400)
RBC: 4.21 MIL/uL (ref 3.87–5.11)
RDW: 13.4 % (ref 11.5–15.5)
WBC: 8 10*3/uL (ref 4.0–10.5)
nRBC: 0 % (ref 0.0–0.2)

## 2020-03-22 LAB — FERRITIN: Ferritin: 236 ng/mL (ref 11–307)

## 2020-05-12 ENCOUNTER — Telehealth: Payer: Self-pay | Admitting: Hematology and Oncology

## 2020-05-12 NOTE — Telephone Encounter (Signed)
05/12/2020 Left VM for pt informing her that appts on 6/9 have been moved to 6/23 due to clinic being closed 6/6-6/10. Encouraged pt to call back if there were any questions or conflicts with new appts  SRW

## 2020-09-21 ENCOUNTER — Other Ambulatory Visit: Payer: Medicare HMO

## 2020-09-21 ENCOUNTER — Ambulatory Visit: Payer: Medicare HMO | Admitting: Hematology and Oncology

## 2020-10-02 DIAGNOSIS — R600 Localized edema: Secondary | ICD-10-CM | POA: Diagnosis not present

## 2020-10-02 DIAGNOSIS — E1122 Type 2 diabetes mellitus with diabetic chronic kidney disease: Secondary | ICD-10-CM | POA: Diagnosis not present

## 2020-10-02 DIAGNOSIS — N2581 Secondary hyperparathyroidism of renal origin: Secondary | ICD-10-CM | POA: Diagnosis not present

## 2020-10-02 DIAGNOSIS — I10 Essential (primary) hypertension: Secondary | ICD-10-CM | POA: Diagnosis not present

## 2020-10-02 DIAGNOSIS — N184 Chronic kidney disease, stage 4 (severe): Secondary | ICD-10-CM | POA: Diagnosis not present

## 2020-10-02 DIAGNOSIS — R008 Other abnormalities of heart beat: Secondary | ICD-10-CM | POA: Diagnosis not present

## 2020-10-05 ENCOUNTER — Inpatient Hospital Stay: Payer: Medicare HMO | Attending: Oncology

## 2020-10-05 ENCOUNTER — Encounter: Payer: Self-pay | Admitting: Oncology

## 2020-10-05 ENCOUNTER — Inpatient Hospital Stay (HOSPITAL_BASED_OUTPATIENT_CLINIC_OR_DEPARTMENT_OTHER): Payer: Medicare HMO | Admitting: Oncology

## 2020-10-05 ENCOUNTER — Other Ambulatory Visit: Payer: Self-pay

## 2020-10-05 VITALS — BP 140/69 | HR 76 | Temp 97.7°F | Resp 20 | Wt 196.4 lb

## 2020-10-05 DIAGNOSIS — Z79899 Other long term (current) drug therapy: Secondary | ICD-10-CM | POA: Insufficient documentation

## 2020-10-05 DIAGNOSIS — D631 Anemia in chronic kidney disease: Secondary | ICD-10-CM | POA: Diagnosis not present

## 2020-10-05 DIAGNOSIS — N183 Chronic kidney disease, stage 3 unspecified: Secondary | ICD-10-CM

## 2020-10-05 DIAGNOSIS — D509 Iron deficiency anemia, unspecified: Secondary | ICD-10-CM

## 2020-10-05 DIAGNOSIS — I129 Hypertensive chronic kidney disease with stage 1 through stage 4 chronic kidney disease, or unspecified chronic kidney disease: Secondary | ICD-10-CM | POA: Insufficient documentation

## 2020-10-05 DIAGNOSIS — E1122 Type 2 diabetes mellitus with diabetic chronic kidney disease: Secondary | ICD-10-CM | POA: Insufficient documentation

## 2020-10-05 LAB — COMPREHENSIVE METABOLIC PANEL
ALT: 20 U/L (ref 0–44)
AST: 27 U/L (ref 15–41)
Albumin: 4.4 g/dL (ref 3.5–5.0)
Alkaline Phosphatase: 55 U/L (ref 38–126)
Anion gap: 7 (ref 5–15)
BUN: 25 mg/dL — ABNORMAL HIGH (ref 8–23)
CO2: 25 mmol/L (ref 22–32)
Calcium: 10.1 mg/dL (ref 8.9–10.3)
Chloride: 108 mmol/L (ref 98–111)
Creatinine, Ser: 1.65 mg/dL — ABNORMAL HIGH (ref 0.44–1.00)
GFR, Estimated: 31 mL/min — ABNORMAL LOW (ref >60)
Glucose, Bld: 145 mg/dL — ABNORMAL HIGH (ref 70–99)
Potassium: 4.2 mmol/L (ref 3.5–5.1)
Sodium: 140 mmol/L (ref 135–145)
Total Bilirubin: 0.6 mg/dL (ref 0.3–1.2)
Total Protein: 7.5 g/dL (ref 6.5–8.1)

## 2020-10-05 LAB — CBC WITH DIFFERENTIAL/PLATELET
Abs Immature Granulocytes: 0.04 10*3/uL (ref 0.00–0.07)
Basophils Absolute: 0.1 10*3/uL (ref 0.0–0.1)
Basophils Relative: 1 %
Eosinophils Absolute: 0.2 10*3/uL (ref 0.0–0.5)
Eosinophils Relative: 3 %
HCT: 37.5 % (ref 36.0–46.0)
Hemoglobin: 12.8 g/dL (ref 12.0–15.0)
Immature Granulocytes: 1 %
Lymphocytes Relative: 22 %
Lymphs Abs: 1.7 10*3/uL (ref 0.7–4.0)
MCH: 29.4 pg (ref 26.0–34.0)
MCHC: 34.1 g/dL (ref 30.0–36.0)
MCV: 86.2 fL (ref 80.0–100.0)
Monocytes Absolute: 0.5 10*3/uL (ref 0.1–1.0)
Monocytes Relative: 7 %
Neutro Abs: 5.3 10*3/uL (ref 1.7–7.7)
Neutrophils Relative %: 66 %
Platelets: 193 10*3/uL (ref 150–400)
RBC: 4.35 MIL/uL (ref 3.87–5.11)
RDW: 13.6 % (ref 11.5–15.5)
WBC: 7.9 10*3/uL (ref 4.0–10.5)
nRBC: 0 % (ref 0.0–0.2)

## 2020-10-05 LAB — IRON AND TIBC
Iron: 83 ug/dL (ref 28–170)
Saturation Ratios: 25 % (ref 10.4–31.8)
TIBC: 326 ug/dL (ref 250–450)
UIBC: 243 ug/dL

## 2020-10-05 LAB — FERRITIN: Ferritin: 251 ng/mL (ref 11–307)

## 2020-10-06 ENCOUNTER — Encounter: Payer: Self-pay | Admitting: Internal Medicine

## 2020-10-06 DIAGNOSIS — E139 Other specified diabetes mellitus without complications: Secondary | ICD-10-CM | POA: Diagnosis not present

## 2020-10-06 NOTE — Progress Notes (Signed)
Hematology Oncology Progress Note   Clinic Day:  10/06/2020  Referring physician: Jodi Marble, MD  Chief Complaint: [name] is a 80 y.o. 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 Work-up on 06/07/2017 revealed a hematocrit of 31.5, hemoglobin 10.3, MCV 87.5, platelets 144,000, white count 6200 with an [place] of 4100.  Retic was 1.2%.  Ferritin was 41.  Iron saturation was 10% with a TIBC of 387.  Normal studies included: B12 (657), folate, TSH, SPEP, free light chain ratio, and LDH. BUN was 54 with a creatinine of 1.96.     Colonoscopy on 02/03/2018 revealed diverticulosis in the sigmoid colon and in the descending colon.  There was one 3 mm polyp in the sigmoid colon and one 3 mm polyp in the descending colon.  There was melanosis in the colon and non-bleeding external and internal hemorrhoids.  Pathology revealed 2 tubular adenomas.   She has received Venofer on several occasions: weekly x4 (06/25/2017 - 07/16/2017), 08/19/2017, and 12/23/2017.     INTERVAL HISTORY [name] is a 80 y.o. female who has above history reviewed by me today presents for follow up visit for management of anemia.  Problems and complaints are listed below: She reports feeling well today.  Chronic fatigue unchanged.   Past Medical History:  Diagnosis Date   Arthritis    "hands, feet, knees, shoulders" (05/05/2014)   Bipolar disorder ([place])    Cataracts, bilateral    immature   Chronic kidney disease    "dr says they aren't functioning like they should" (05/05/2014)   Coronary artery disease    Depression    takes Zoloft daily   History of bronchitis 53yrs ago   Hyperlipidemia    takes  Atorvastatin daily   Hypertension    takes Metoprolol,Lisinopril,and Apresoline daily   Joint pain    Peripheral edema    takes Chlothalidone daily   Squamous cell cancer of skin of nose 2012   "right"   Squamous cell cancer of skin of nose    Type II diabetes mellitus ([place])    takes Tradjenta daily    Past Surgical History:  Procedure Laterality Date   COLONOSCOPY WITH PROPOFOL N/A 02/03/2018   Procedure: COLONOSCOPY WITH PROPOFOL;  Surgeon: Lollie Sails, MD;  Location: [facility] ENDOSCOPY;  Service: Endoscopy;  Laterality: N/A;   CORONARY ARTERY BYPASS GRAFT  05/12/2007   "CABG X1; at [place]"   coronary artery bypass with arterial grafts     DILATION AND CURETTAGE OF UTERUS  2005   FRACTURE SURGERY     I & D EXTREMITY Right 04/30/2014   Procedure: IRRIGATION AND DEBRIDEMENT EXTREMITY;  Surgeon: Wylene Simmer, MD;  Location: [place];  Service: Orthopedics;  Laterality: Right;   INCISION AND DRAINAGE Right 08/11/2014   Procedure: REMOVAL OF DEEP IMPLANTS OF RIGHT TIBIAL FIBIAL INCISION AND DRAINAGE OF RIGHT ANKLE WOUND;  Surgeon: Wylene Simmer, MD;  Location: [place];  Service: Orthopedics;  Laterality: Right;   ORIF ANKLE FRACTURE Right 04/30/2014   Procedure: OPEN REDUCTION INTERNAL FIXATION (ORIF) ANKLE FRACTURE;  Surgeon: Wylene Simmer, MD;  Location: [place];  Service: Orthopedics;  Laterality: Right;   REMOVAL OF IMPLANT Right 08/11/2014   Procedure:  REMOVAL OF DEEP IMPLANTS OF RIGHT TIBIAL FIBIAL INCISION AND DRAINAGE OF RIGHT ANKLE WOUND;  Surgeon: Wylene Simmer, MD;  Location: [place];  Service: Orthopedics;  Laterality: Right;   SQUAMOUS CELL CARCINOMA EXCISION Right 2012   "nose"   VAGINAL HYSTERECTOMY  2007    History reviewed. No pertinent family history.  Social History:  reports that she has never smoked. She has never used smokeless tobacco. She reports that she does not drink alcohol and does not use drugs. 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 She lives in [place]. The  patient is alone today.   Allergies:  Allergies  Allergen Reactions   Codeine Nausea And Vomiting    "Makes me deathly sick"    Current Medications: Current Outpatient Medications  Medication Sig Dispense Refill   amLODipine (NORVASC) 10 MG tablet Take 10 mg by mouth daily.     aspirin EC 325 MG tablet Take 325 mg by mouth daily.     calcitRIOL (ROCALTROL) 0.25 MCG capsule Take by mouth.     Calcium Carb-Cholecalciferol (CALTRATE 600+D3 SOFT) 600-800 MG-UNIT CHEW Chew by mouth.     cetirizine (ZYRTEC) 10 MG tablet Take 10 mg by mouth daily.      chlorthalidone (HYGROTON) 25 MG tablet Take 25 mg by mouth daily.   2   Cholecalciferol (VITAMIN D3) 5000 units TABS Take 1 tablet by mouth daily.     diclofenac sodium (VOLTAREN) 1 % GEL Apply 2 g topically as needed.      ferrous sulfate 325 (65 FE) MG tablet Take 1 tablet by mouth daily.     fluticasone (FLONASE) 50 MCG/ACT nasal spray Place 1 spray into both nostrils daily.     hydrALAZINE (APRESOLINE) 50 MG tablet Take 50 mg by mouth 3 (three) times daily.   2   lisinopril (PRINIVIL,ZESTRIL) 40 MG tablet Take 40 mg by mouth daily.   2   metoprolol succinate (TOPROL-XL) 50 MG 24 hr tablet Take 50 mg by mouth daily.      rosuvastatin (CRESTOR) 40 MG tablet Take by mouth.     sertraline (ZOLOFT) 50 MG tablet Take 50 mg by mouth daily.      traMADol-acetaminophen (ULTRACET) 37.5-325 MG per tablet Take 1 tablet by mouth every 6 (six) hours as needed.     traMADol (ULTRAM) 50 MG tablet Comments:   Filled Date: Jul 05 2016 12:00AM  Patient Notes: TK 1 TO 2 TS PO D PRN  Duration: 30 (Patient not taking: Reported on 10/05/2020)     No current facility-administered medications for this visit.    Review of Systems  Constitutional:  Positive for malaise/fatigue. Negative for chills, diaphoresis, fever and weight loss (up 4 lbs).       Feels "fine".   HENT:  Positive for hearing loss (decreased). Negative for congestion, ear pain, nosebleeds,  sinus pain, sore throat and tinnitus.   Eyes: Negative.  Negative for blurred vision and double vision.  Respiratory: Negative.  Negative for cough, hemoptysis, sputum production and shortness of breath.   Cardiovascular: Negative.  Negative for chest pain, palpitations and leg swelling.  Gastrointestinal: Negative.  Negative for abdominal pain, blood in stool, constipation, diarrhea, heartburn, melena, nausea and vomiting.       Eating well.  Genitourinary:  Negative for dysuria, frequency, hematuria and urgency.  Musculoskeletal:  Positive for joint pain (arthritis in knees, on tramadol). Negative for back pain, myalgias and neck pain.  Skin: Negative.  Negative for itching  and rash.  Neurological: Negative.  Negative for dizziness, tingling, sensory change, focal weakness, weakness and headaches.  Endo/Heme/Allergies: Negative.  Does not bruise/bleed easily.       Hemoglobin A1c is 5.7.  Psychiatric/Behavioral:  Negative for depression and memory loss. The patient is not nervous/anxious and does not have insomnia.        Bipolar disorder.  All other systems reviewed and are negative. Performance status (ECOG): 1 - Symptomatic but completely ambulatory   Vitals Blood pressure 140/69, pulse 76, temperature 97.7 F (36.5 C), resp. rate 20, weight 196 lb 6.9 oz (89.1 kg), SpO2 93 %.  Physical Exam Vitals and nursing note reviewed.  Constitutional:      General: She is not in acute distress.    Appearance: She is well-developed. She is not diaphoretic.     Comments: She has a rolling walker at her side.  HENT:     Head: Normocephalic and atraumatic.     Mouth/Throat:     Pharynx: No oropharyngeal exudate.  Eyes:     General: No scleral icterus.    Pupils: Pupils are equal, round, and reactive to light.  Neck:     Vascular: No JVD.  Cardiovascular:     Rate and Rhythm: Normal rate and regular rhythm.     Heart sounds: Normal heart sounds. No murmur heard. Pulmonary:     Effort:  Pulmonary effort is normal. No respiratory distress.     Breath sounds: Normal breath sounds. No wheezing or rales.  Chest:     Chest wall: No tenderness.  Breasts:    Right: No supraclavicular adenopathy.     Left: No supraclavicular adenopathy.  Abdominal:     General: Bowel sounds are normal. There is no distension.     Palpations: Abdomen is soft. There is no mass.     Tenderness: There is no abdominal tenderness. There is no guarding or rebound.  Musculoskeletal:        General: Tenderness (lower legs) present.     Cervical back: Normal range of motion and neck supple.     Comments: Chronic lower extremity changes.  Lymphadenopathy:     Head:     Right side of head: No preauricular, posterior auricular or occipital adenopathy.     Left side of head: No preauricular, posterior auricular or occipital adenopathy.     Cervical: No cervical adenopathy.     Upper Body:     Right upper body: No supraclavicular adenopathy.     Left upper body: No supraclavicular adenopathy.     Lower Body: No right inguinal adenopathy. No left inguinal adenopathy.  Skin:    General: Skin is warm and dry.     Coloration: Skin is not pale.     Findings: No erythema or rash.  Neurological:     Mental Status: She is alert and oriented to person, place, and time.  Psychiatric:        Mood and Affect: Mood normal.   Orders Only on 10/05/2020  Component Date Value Ref Range Status   Ferritin 10/05/2020 251  11 - 307 ng/mL Final   Performed at [facility], [address] [place], [place] [address]   Iron 10/05/2020 83  28 - 170 ug/dL Final   TIBC 10/05/2020 326  250 - 450 ug/dL Final   Saturation Ratios 10/05/2020 25  10.4 - 31.8 % Final   UIBC 10/05/2020 243  ug/dL Final   Performed at [facility], [address] [place], [place] [address]   Sodium 10/05/2020 140  135 - 145 mmol/L Final   Potassium 10/05/2020 4.2  3.5 - 5.1 mmol/L Final   Chloride 10/05/2020 108  98 - 111  mmol/L Final   CO2 10/05/2020 25  22 - 32 mmol/L Final   Glucose, Bld 10/05/2020 145 (A) 70 - 99 mg/dL Final   Glucose reference range applies only to samples taken after fasting for at least 8 hours.   BUN 10/05/2020 25 (A) 8 - 23 mg/dL Final   Creatinine, Ser 10/05/2020 1.65 (A) 0.44 - 1.00 mg/dL Final   Calcium 10/05/2020 10.1  8.9 - 10.3 mg/dL Final   Total Protein 10/05/2020 7.5  6.5 - 8.1 g/dL Final   Albumin 10/05/2020 4.4  3.5 - 5.0 g/dL Final   AST 10/05/2020 27  15 - 41 U/L Final   ALT 10/05/2020 20  0 - 44 U/L Final   Alkaline Phosphatase 10/05/2020 55  38 - 126 U/L Final   Total Bilirubin 10/05/2020 0.6  0.3 - 1.2 mg/dL Final   GFR, Estimated 10/05/2020 31 (A) >60 mL/min Final   Comment: (NOTE) Calculated using the CKD-EPI Creatinine Equation (2021)    Anion gap 10/05/2020 7  5 - 15 Final   Performed at [facility], [address] [place], [place] [address]   WBC 10/05/2020 7.9  4.0 - 10.5 K/uL Final   RBC 10/05/2020 4.35  3.87 - 5.11 MIL/uL Final   Hemoglobin 10/05/2020 12.8  12.0 - 15.0 g/dL Final   HCT 10/05/2020 37.5  36.0 - 46.0 % Final   MCV 10/05/2020 86.2  80.0 - 100.0 fL Final   MCH 10/05/2020 29.4  26.0 - 34.0 pg Final   MCHC 10/05/2020 34.1  30.0 - 36.0 g/dL Final   RDW 10/05/2020 13.6  11.5 - 15.5 % Final   Platelets 10/05/2020 193  150 - 400 K/uL Final   nRBC 10/05/2020 0.0  0.0 - 0.2 % Final   Neutrophils Relative % 10/05/2020 66  % Final   Neutro Abs 10/05/2020 5.3  1.7 - 7.7 K/uL Final   Lymphocytes Relative 10/05/2020 22  % Final   Lymphs Abs 10/05/2020 1.7  0.7 - 4.0 K/uL Final   Monocytes Relative 10/05/2020 7  % Final   Monocytes Absolute 10/05/2020 0.5  0.1 - 1.0 K/uL Final   Eosinophils Relative 10/05/2020 3  % Final   Eosinophils Absolute 10/05/2020 0.2  0.0 - 0.5 K/uL Final   Basophils Relative 10/05/2020 1  % Final   Basophils Absolute 10/05/2020 0.1  0.0 - 0.1 K/uL Final   Immature Granulocytes 10/05/2020 1  % Final   Abs  Immature Granulocytes 10/05/2020 0.04  0.00 - 0.07 K/uL Final   Performed at [facility], [address] [place], [place] [address]    Assessment and Plan: 1. Iron deficiency anemia, unspecified iron deficiency anemia type   2. Anemia due to stage 3 chronic kidney disease, unspecified whether stage 3a or 3b CKD ([place])     # Iron deficiency anemia Labs are reviewed and discussed with patient.  Counts are stable, no need for IV venofer.   #Stage III chronic kidney disease Normal hemoglobin. No need for retacrit.           RTC in 12 months for MD assessment and labs (CBC with diff, CMP, iron tibc, ferritin).    I discussed the assessment and treatment plan with the patient.  The patient was provided an  opportunity to ask questions and all were answered.  The patient agreed with the plan and demonstrated an understanding of the instructions.  The patient was advised to call back if the symptoms worsen or if the condition fails to improve as anticipated.  Earlie Server, MD, PhD Hematology Oncology [place] at [facility] 10/06/2020

## 2020-10-09 DIAGNOSIS — E46 Unspecified protein-calorie malnutrition: Secondary | ICD-10-CM | POA: Diagnosis not present

## 2020-10-09 DIAGNOSIS — I25810 Atherosclerosis of coronary artery bypass graft(s) without angina pectoris: Secondary | ICD-10-CM | POA: Diagnosis not present

## 2020-10-09 DIAGNOSIS — I495 Sick sinus syndrome: Secondary | ICD-10-CM | POA: Diagnosis not present

## 2020-10-09 DIAGNOSIS — H9190 Unspecified hearing loss, unspecified ear: Secondary | ICD-10-CM | POA: Diagnosis not present

## 2020-10-09 DIAGNOSIS — F329 Major depressive disorder, single episode, unspecified: Secondary | ICD-10-CM | POA: Diagnosis not present

## 2020-10-09 DIAGNOSIS — I10 Essential (primary) hypertension: Secondary | ICD-10-CM | POA: Diagnosis not present

## 2020-10-09 DIAGNOSIS — E139 Other specified diabetes mellitus without complications: Secondary | ICD-10-CM | POA: Diagnosis not present

## 2020-10-09 DIAGNOSIS — I2510 Atherosclerotic heart disease of native coronary artery without angina pectoris: Secondary | ICD-10-CM | POA: Diagnosis not present

## 2020-10-09 DIAGNOSIS — J301 Allergic rhinitis due to pollen: Secondary | ICD-10-CM | POA: Diagnosis not present

## 2020-10-09 DIAGNOSIS — E785 Hyperlipidemia, unspecified: Secondary | ICD-10-CM | POA: Diagnosis not present

## 2020-10-09 DIAGNOSIS — R0602 Shortness of breath: Secondary | ICD-10-CM | POA: Diagnosis not present

## 2021-01-05 DIAGNOSIS — E139 Other specified diabetes mellitus without complications: Secondary | ICD-10-CM | POA: Diagnosis not present

## 2021-01-05 DIAGNOSIS — I10 Essential (primary) hypertension: Secondary | ICD-10-CM | POA: Diagnosis not present

## 2021-01-09 DIAGNOSIS — I25810 Atherosclerosis of coronary artery bypass graft(s) without angina pectoris: Secondary | ICD-10-CM | POA: Diagnosis not present

## 2021-01-09 DIAGNOSIS — I10 Essential (primary) hypertension: Secondary | ICD-10-CM | POA: Diagnosis not present

## 2021-01-09 DIAGNOSIS — I2510 Atherosclerotic heart disease of native coronary artery without angina pectoris: Secondary | ICD-10-CM | POA: Diagnosis not present

## 2021-01-09 DIAGNOSIS — I351 Nonrheumatic aortic (valve) insufficiency: Secondary | ICD-10-CM | POA: Diagnosis not present

## 2021-01-09 DIAGNOSIS — E139 Other specified diabetes mellitus without complications: Secondary | ICD-10-CM | POA: Diagnosis not present

## 2021-01-09 DIAGNOSIS — R0602 Shortness of breath: Secondary | ICD-10-CM | POA: Diagnosis not present

## 2021-01-09 DIAGNOSIS — I340 Nonrheumatic mitral (valve) insufficiency: Secondary | ICD-10-CM | POA: Diagnosis not present

## 2021-01-09 DIAGNOSIS — E669 Obesity, unspecified: Secondary | ICD-10-CM | POA: Diagnosis not present

## 2021-01-09 DIAGNOSIS — E785 Hyperlipidemia, unspecified: Secondary | ICD-10-CM | POA: Diagnosis not present

## 2021-01-09 DIAGNOSIS — F329 Major depressive disorder, single episode, unspecified: Secondary | ICD-10-CM | POA: Diagnosis not present

## 2021-01-10 DIAGNOSIS — I10 Essential (primary) hypertension: Secondary | ICD-10-CM | POA: Diagnosis not present

## 2021-02-05 DIAGNOSIS — I10 Essential (primary) hypertension: Secondary | ICD-10-CM | POA: Diagnosis not present

## 2021-02-05 DIAGNOSIS — N2581 Secondary hyperparathyroidism of renal origin: Secondary | ICD-10-CM | POA: Diagnosis not present

## 2021-02-05 DIAGNOSIS — N184 Chronic kidney disease, stage 4 (severe): Secondary | ICD-10-CM | POA: Diagnosis not present

## 2021-02-05 DIAGNOSIS — R600 Localized edema: Secondary | ICD-10-CM | POA: Diagnosis not present

## 2021-02-05 DIAGNOSIS — E1122 Type 2 diabetes mellitus with diabetic chronic kidney disease: Secondary | ICD-10-CM | POA: Diagnosis not present

## 2021-04-13 DIAGNOSIS — I10 Essential (primary) hypertension: Secondary | ICD-10-CM | POA: Diagnosis not present

## 2021-04-13 DIAGNOSIS — E139 Other specified diabetes mellitus without complications: Secondary | ICD-10-CM | POA: Diagnosis not present

## 2021-04-17 DIAGNOSIS — E669 Obesity, unspecified: Secondary | ICD-10-CM | POA: Diagnosis not present

## 2021-04-17 DIAGNOSIS — I25810 Atherosclerosis of coronary artery bypass graft(s) without angina pectoris: Secondary | ICD-10-CM | POA: Diagnosis not present

## 2021-04-17 DIAGNOSIS — I2510 Atherosclerotic heart disease of native coronary artery without angina pectoris: Secondary | ICD-10-CM | POA: Diagnosis not present

## 2021-04-17 DIAGNOSIS — I10 Essential (primary) hypertension: Secondary | ICD-10-CM | POA: Diagnosis not present

## 2021-04-17 DIAGNOSIS — I340 Nonrheumatic mitral (valve) insufficiency: Secondary | ICD-10-CM | POA: Diagnosis not present

## 2021-04-17 DIAGNOSIS — I351 Nonrheumatic aortic (valve) insufficiency: Secondary | ICD-10-CM | POA: Diagnosis not present

## 2021-04-17 DIAGNOSIS — E785 Hyperlipidemia, unspecified: Secondary | ICD-10-CM | POA: Diagnosis not present

## 2021-04-20 DIAGNOSIS — E139 Other specified diabetes mellitus without complications: Secondary | ICD-10-CM | POA: Diagnosis not present

## 2021-04-20 DIAGNOSIS — I10 Essential (primary) hypertension: Secondary | ICD-10-CM | POA: Diagnosis not present

## 2021-04-20 DIAGNOSIS — Z23 Encounter for immunization: Secondary | ICD-10-CM | POA: Diagnosis not present

## 2021-04-20 DIAGNOSIS — E785 Hyperlipidemia, unspecified: Secondary | ICD-10-CM | POA: Diagnosis not present

## 2021-04-20 DIAGNOSIS — F329 Major depressive disorder, single episode, unspecified: Secondary | ICD-10-CM | POA: Diagnosis not present

## 2021-04-20 DIAGNOSIS — I2510 Atherosclerotic heart disease of native coronary artery without angina pectoris: Secondary | ICD-10-CM | POA: Diagnosis not present

## 2021-04-20 DIAGNOSIS — E669 Obesity, unspecified: Secondary | ICD-10-CM | POA: Diagnosis not present

## 2021-04-20 DIAGNOSIS — I25810 Atherosclerosis of coronary artery bypass graft(s) without angina pectoris: Secondary | ICD-10-CM | POA: Diagnosis not present

## 2021-04-20 DIAGNOSIS — I351 Nonrheumatic aortic (valve) insufficiency: Secondary | ICD-10-CM | POA: Diagnosis not present

## 2021-04-20 DIAGNOSIS — I340 Nonrheumatic mitral (valve) insufficiency: Secondary | ICD-10-CM | POA: Diagnosis not present

## 2021-05-01 DIAGNOSIS — E669 Obesity, unspecified: Secondary | ICD-10-CM | POA: Diagnosis not present

## 2021-05-01 DIAGNOSIS — I25810 Atherosclerosis of coronary artery bypass graft(s) without angina pectoris: Secondary | ICD-10-CM | POA: Diagnosis not present

## 2021-05-01 DIAGNOSIS — E785 Hyperlipidemia, unspecified: Secondary | ICD-10-CM | POA: Diagnosis not present

## 2021-05-01 DIAGNOSIS — I10 Essential (primary) hypertension: Secondary | ICD-10-CM | POA: Diagnosis not present

## 2021-05-01 DIAGNOSIS — I351 Nonrheumatic aortic (valve) insufficiency: Secondary | ICD-10-CM | POA: Diagnosis not present

## 2021-05-01 DIAGNOSIS — I2510 Atherosclerotic heart disease of native coronary artery without angina pectoris: Secondary | ICD-10-CM | POA: Diagnosis not present

## 2021-05-01 DIAGNOSIS — I340 Nonrheumatic mitral (valve) insufficiency: Secondary | ICD-10-CM | POA: Diagnosis not present

## 2021-05-08 DIAGNOSIS — I10 Essential (primary) hypertension: Secondary | ICD-10-CM | POA: Diagnosis not present

## 2021-05-08 DIAGNOSIS — E1122 Type 2 diabetes mellitus with diabetic chronic kidney disease: Secondary | ICD-10-CM | POA: Diagnosis not present

## 2021-05-08 DIAGNOSIS — R809 Proteinuria, unspecified: Secondary | ICD-10-CM | POA: Diagnosis not present

## 2021-05-08 DIAGNOSIS — N2581 Secondary hyperparathyroidism of renal origin: Secondary | ICD-10-CM | POA: Diagnosis not present

## 2021-05-08 DIAGNOSIS — N184 Chronic kidney disease, stage 4 (severe): Secondary | ICD-10-CM | POA: Diagnosis not present

## 2021-06-29 DIAGNOSIS — I2510 Atherosclerotic heart disease of native coronary artery without angina pectoris: Secondary | ICD-10-CM | POA: Diagnosis not present

## 2021-06-29 DIAGNOSIS — I25810 Atherosclerosis of coronary artery bypass graft(s) without angina pectoris: Secondary | ICD-10-CM | POA: Diagnosis not present

## 2021-06-29 DIAGNOSIS — E669 Obesity, unspecified: Secondary | ICD-10-CM | POA: Diagnosis not present

## 2021-06-29 DIAGNOSIS — E785 Hyperlipidemia, unspecified: Secondary | ICD-10-CM | POA: Diagnosis not present

## 2021-06-29 DIAGNOSIS — I351 Nonrheumatic aortic (valve) insufficiency: Secondary | ICD-10-CM | POA: Diagnosis not present

## 2021-06-29 DIAGNOSIS — I340 Nonrheumatic mitral (valve) insufficiency: Secondary | ICD-10-CM | POA: Diagnosis not present

## 2021-06-29 DIAGNOSIS — I10 Essential (primary) hypertension: Secondary | ICD-10-CM | POA: Diagnosis not present

## 2021-07-26 DIAGNOSIS — E139 Other specified diabetes mellitus without complications: Secondary | ICD-10-CM | POA: Diagnosis not present

## 2021-08-02 ENCOUNTER — Other Ambulatory Visit: Payer: Self-pay | Admitting: Nurse Practitioner

## 2021-08-03 ENCOUNTER — Other Ambulatory Visit: Payer: Self-pay | Admitting: Nurse Practitioner

## 2021-08-20 DIAGNOSIS — F329 Major depressive disorder, single episode, unspecified: Secondary | ICD-10-CM | POA: Diagnosis not present

## 2021-08-20 DIAGNOSIS — I10 Essential (primary) hypertension: Secondary | ICD-10-CM | POA: Diagnosis not present

## 2021-08-20 DIAGNOSIS — I340 Nonrheumatic mitral (valve) insufficiency: Secondary | ICD-10-CM | POA: Diagnosis not present

## 2021-08-20 DIAGNOSIS — E785 Hyperlipidemia, unspecified: Secondary | ICD-10-CM | POA: Diagnosis not present

## 2021-08-20 DIAGNOSIS — I25810 Atherosclerosis of coronary artery bypass graft(s) without angina pectoris: Secondary | ICD-10-CM | POA: Diagnosis not present

## 2021-08-20 DIAGNOSIS — I351 Nonrheumatic aortic (valve) insufficiency: Secondary | ICD-10-CM | POA: Diagnosis not present

## 2021-08-20 DIAGNOSIS — E139 Other specified diabetes mellitus without complications: Secondary | ICD-10-CM | POA: Diagnosis not present

## 2021-08-20 DIAGNOSIS — I2510 Atherosclerotic heart disease of native coronary artery without angina pectoris: Secondary | ICD-10-CM | POA: Diagnosis not present

## 2021-08-20 DIAGNOSIS — E669 Obesity, unspecified: Secondary | ICD-10-CM | POA: Diagnosis not present

## 2021-09-05 DIAGNOSIS — I10 Essential (primary) hypertension: Secondary | ICD-10-CM | POA: Diagnosis not present

## 2021-09-05 DIAGNOSIS — N2581 Secondary hyperparathyroidism of renal origin: Secondary | ICD-10-CM | POA: Diagnosis not present

## 2021-09-05 DIAGNOSIS — N184 Chronic kidney disease, stage 4 (severe): Secondary | ICD-10-CM | POA: Diagnosis not present

## 2021-09-05 DIAGNOSIS — N1832 Chronic kidney disease, stage 3b: Secondary | ICD-10-CM | POA: Diagnosis not present

## 2021-09-05 DIAGNOSIS — E1122 Type 2 diabetes mellitus with diabetic chronic kidney disease: Secondary | ICD-10-CM | POA: Diagnosis not present

## 2021-09-05 DIAGNOSIS — R809 Proteinuria, unspecified: Secondary | ICD-10-CM | POA: Diagnosis not present

## 2021-10-01 DIAGNOSIS — R609 Edema, unspecified: Secondary | ICD-10-CM | POA: Diagnosis not present

## 2021-10-01 DIAGNOSIS — I25810 Atherosclerosis of coronary artery bypass graft(s) without angina pectoris: Secondary | ICD-10-CM | POA: Diagnosis not present

## 2021-10-01 DIAGNOSIS — E785 Hyperlipidemia, unspecified: Secondary | ICD-10-CM | POA: Diagnosis not present

## 2021-10-01 DIAGNOSIS — E669 Obesity, unspecified: Secondary | ICD-10-CM | POA: Diagnosis not present

## 2021-10-01 DIAGNOSIS — I351 Nonrheumatic aortic (valve) insufficiency: Secondary | ICD-10-CM | POA: Diagnosis not present

## 2021-10-01 DIAGNOSIS — I2510 Atherosclerotic heart disease of native coronary artery without angina pectoris: Secondary | ICD-10-CM | POA: Diagnosis not present

## 2021-10-01 DIAGNOSIS — I340 Nonrheumatic mitral (valve) insufficiency: Secondary | ICD-10-CM | POA: Diagnosis not present

## 2021-10-01 DIAGNOSIS — I10 Essential (primary) hypertension: Secondary | ICD-10-CM | POA: Diagnosis not present

## 2021-10-09 ENCOUNTER — Other Ambulatory Visit: Payer: Medicare HMO

## 2021-10-11 ENCOUNTER — Inpatient Hospital Stay: Payer: Medicare HMO | Attending: Oncology | Admitting: Oncology

## 2021-10-11 ENCOUNTER — Ambulatory Visit: Payer: Medicare HMO | Admitting: Oncology

## 2021-10-11 ENCOUNTER — Encounter: Payer: Self-pay | Admitting: Oncology

## 2021-11-14 DIAGNOSIS — E139 Other specified diabetes mellitus without complications: Secondary | ICD-10-CM | POA: Diagnosis not present

## 2021-11-14 DIAGNOSIS — E785 Hyperlipidemia, unspecified: Secondary | ICD-10-CM | POA: Diagnosis not present

## 2021-11-20 DIAGNOSIS — E139 Other specified diabetes mellitus without complications: Secondary | ICD-10-CM | POA: Diagnosis not present

## 2021-11-20 DIAGNOSIS — E669 Obesity, unspecified: Secondary | ICD-10-CM | POA: Diagnosis not present

## 2021-11-20 DIAGNOSIS — I340 Nonrheumatic mitral (valve) insufficiency: Secondary | ICD-10-CM | POA: Diagnosis not present

## 2021-11-20 DIAGNOSIS — I10 Essential (primary) hypertension: Secondary | ICD-10-CM | POA: Diagnosis not present

## 2021-11-20 DIAGNOSIS — I351 Nonrheumatic aortic (valve) insufficiency: Secondary | ICD-10-CM | POA: Diagnosis not present

## 2021-11-20 DIAGNOSIS — E785 Hyperlipidemia, unspecified: Secondary | ICD-10-CM | POA: Diagnosis not present

## 2021-11-20 DIAGNOSIS — I25810 Atherosclerosis of coronary artery bypass graft(s) without angina pectoris: Secondary | ICD-10-CM | POA: Diagnosis not present

## 2021-11-20 DIAGNOSIS — I2510 Atherosclerotic heart disease of native coronary artery without angina pectoris: Secondary | ICD-10-CM | POA: Diagnosis not present

## 2021-11-20 DIAGNOSIS — M5412 Radiculopathy, cervical region: Secondary | ICD-10-CM | POA: Diagnosis not present

## 2021-11-21 ENCOUNTER — Other Ambulatory Visit: Payer: Self-pay | Admitting: Internal Medicine

## 2021-11-21 DIAGNOSIS — M5412 Radiculopathy, cervical region: Secondary | ICD-10-CM

## 2021-12-20 DIAGNOSIS — E1122 Type 2 diabetes mellitus with diabetic chronic kidney disease: Secondary | ICD-10-CM | POA: Diagnosis not present

## 2021-12-20 DIAGNOSIS — N1832 Chronic kidney disease, stage 3b: Secondary | ICD-10-CM | POA: Diagnosis not present

## 2021-12-27 DIAGNOSIS — N184 Chronic kidney disease, stage 4 (severe): Secondary | ICD-10-CM | POA: Diagnosis not present

## 2021-12-27 DIAGNOSIS — N2581 Secondary hyperparathyroidism of renal origin: Secondary | ICD-10-CM | POA: Diagnosis not present

## 2021-12-27 DIAGNOSIS — E1122 Type 2 diabetes mellitus with diabetic chronic kidney disease: Secondary | ICD-10-CM | POA: Diagnosis not present

## 2021-12-27 DIAGNOSIS — D631 Anemia in chronic kidney disease: Secondary | ICD-10-CM | POA: Diagnosis not present

## 2021-12-27 DIAGNOSIS — I10 Essential (primary) hypertension: Secondary | ICD-10-CM | POA: Diagnosis not present

## 2022-02-12 DIAGNOSIS — N184 Chronic kidney disease, stage 4 (severe): Secondary | ICD-10-CM | POA: Diagnosis not present

## 2022-02-12 DIAGNOSIS — E1122 Type 2 diabetes mellitus with diabetic chronic kidney disease: Secondary | ICD-10-CM | POA: Diagnosis not present

## 2022-02-20 DIAGNOSIS — I10 Essential (primary) hypertension: Secondary | ICD-10-CM | POA: Diagnosis not present

## 2022-02-20 DIAGNOSIS — E139 Other specified diabetes mellitus without complications: Secondary | ICD-10-CM | POA: Diagnosis not present

## 2022-02-25 DIAGNOSIS — I340 Nonrheumatic mitral (valve) insufficiency: Secondary | ICD-10-CM | POA: Diagnosis not present

## 2022-02-25 DIAGNOSIS — I2510 Atherosclerotic heart disease of native coronary artery without angina pectoris: Secondary | ICD-10-CM | POA: Diagnosis not present

## 2022-02-25 DIAGNOSIS — E785 Hyperlipidemia, unspecified: Secondary | ICD-10-CM | POA: Diagnosis not present

## 2022-02-25 DIAGNOSIS — E669 Obesity, unspecified: Secondary | ICD-10-CM | POA: Diagnosis not present

## 2022-02-25 DIAGNOSIS — I25810 Atherosclerosis of coronary artery bypass graft(s) without angina pectoris: Secondary | ICD-10-CM | POA: Diagnosis not present

## 2022-02-25 DIAGNOSIS — I351 Nonrheumatic aortic (valve) insufficiency: Secondary | ICD-10-CM | POA: Diagnosis not present

## 2022-02-25 DIAGNOSIS — I10 Essential (primary) hypertension: Secondary | ICD-10-CM | POA: Diagnosis not present

## 2022-03-04 DIAGNOSIS — M5412 Radiculopathy, cervical region: Secondary | ICD-10-CM | POA: Diagnosis not present

## 2022-03-04 DIAGNOSIS — E785 Hyperlipidemia, unspecified: Secondary | ICD-10-CM | POA: Diagnosis not present

## 2022-03-04 DIAGNOSIS — E669 Obesity, unspecified: Secondary | ICD-10-CM | POA: Diagnosis not present

## 2022-03-04 DIAGNOSIS — E139 Other specified diabetes mellitus without complications: Secondary | ICD-10-CM | POA: Diagnosis not present

## 2022-03-04 DIAGNOSIS — I10 Essential (primary) hypertension: Secondary | ICD-10-CM | POA: Diagnosis not present

## 2022-03-04 DIAGNOSIS — I2510 Atherosclerotic heart disease of native coronary artery without angina pectoris: Secondary | ICD-10-CM | POA: Diagnosis not present

## 2022-03-04 DIAGNOSIS — Z23 Encounter for immunization: Secondary | ICD-10-CM | POA: Diagnosis not present

## 2022-03-04 DIAGNOSIS — I351 Nonrheumatic aortic (valve) insufficiency: Secondary | ICD-10-CM | POA: Diagnosis not present

## 2022-03-04 DIAGNOSIS — I340 Nonrheumatic mitral (valve) insufficiency: Secondary | ICD-10-CM | POA: Diagnosis not present

## 2022-03-04 DIAGNOSIS — I25810 Atherosclerosis of coronary artery bypass graft(s) without angina pectoris: Secondary | ICD-10-CM | POA: Diagnosis not present

## 2022-03-25 DIAGNOSIS — I2510 Atherosclerotic heart disease of native coronary artery without angina pectoris: Secondary | ICD-10-CM | POA: Diagnosis not present

## 2022-03-25 DIAGNOSIS — I340 Nonrheumatic mitral (valve) insufficiency: Secondary | ICD-10-CM | POA: Diagnosis not present

## 2022-03-25 DIAGNOSIS — E139 Other specified diabetes mellitus without complications: Secondary | ICD-10-CM | POA: Diagnosis not present

## 2022-03-25 DIAGNOSIS — E669 Obesity, unspecified: Secondary | ICD-10-CM | POA: Diagnosis not present

## 2022-03-25 DIAGNOSIS — Z1331 Encounter for screening for depression: Secondary | ICD-10-CM | POA: Diagnosis not present

## 2022-03-25 DIAGNOSIS — M5412 Radiculopathy, cervical region: Secondary | ICD-10-CM | POA: Diagnosis not present

## 2022-03-25 DIAGNOSIS — Z0001 Encounter for general adult medical examination with abnormal findings: Secondary | ICD-10-CM | POA: Diagnosis not present

## 2022-03-25 DIAGNOSIS — I351 Nonrheumatic aortic (valve) insufficiency: Secondary | ICD-10-CM | POA: Diagnosis not present

## 2022-03-25 DIAGNOSIS — I10 Essential (primary) hypertension: Secondary | ICD-10-CM | POA: Diagnosis not present

## 2022-03-27 DIAGNOSIS — I10 Essential (primary) hypertension: Secondary | ICD-10-CM | POA: Diagnosis not present

## 2022-03-27 DIAGNOSIS — N184 Chronic kidney disease, stage 4 (severe): Secondary | ICD-10-CM | POA: Diagnosis not present

## 2022-03-27 DIAGNOSIS — D631 Anemia in chronic kidney disease: Secondary | ICD-10-CM | POA: Diagnosis not present

## 2022-03-27 DIAGNOSIS — R809 Proteinuria, unspecified: Secondary | ICD-10-CM | POA: Diagnosis not present

## 2022-03-27 DIAGNOSIS — E1122 Type 2 diabetes mellitus with diabetic chronic kidney disease: Secondary | ICD-10-CM | POA: Diagnosis not present

## 2022-03-27 DIAGNOSIS — N2581 Secondary hyperparathyroidism of renal origin: Secondary | ICD-10-CM | POA: Diagnosis not present

## 2022-04-01 DIAGNOSIS — I10 Essential (primary) hypertension: Secondary | ICD-10-CM | POA: Diagnosis not present

## 2022-04-01 DIAGNOSIS — E1122 Type 2 diabetes mellitus with diabetic chronic kidney disease: Secondary | ICD-10-CM | POA: Diagnosis not present

## 2022-04-01 DIAGNOSIS — D631 Anemia in chronic kidney disease: Secondary | ICD-10-CM | POA: Diagnosis not present

## 2022-04-01 DIAGNOSIS — N1832 Chronic kidney disease, stage 3b: Secondary | ICD-10-CM | POA: Diagnosis not present

## 2022-04-01 DIAGNOSIS — N2581 Secondary hyperparathyroidism of renal origin: Secondary | ICD-10-CM | POA: Diagnosis not present

## 2022-05-22 ENCOUNTER — Other Ambulatory Visit: Payer: Self-pay | Admitting: Internal Medicine

## 2022-05-22 DIAGNOSIS — F330 Major depressive disorder, recurrent, mild: Secondary | ICD-10-CM

## 2022-05-22 DIAGNOSIS — S2241XS Multiple fractures of ribs, right side, sequela: Secondary | ICD-10-CM

## 2022-05-23 ENCOUNTER — Other Ambulatory Visit: Payer: Self-pay

## 2022-05-24 MED ORDER — TRAMADOL HCL 50 MG PO TABS: ORAL_TABLET | ORAL | 0 refills | Status: DC

## 2022-05-31 ENCOUNTER — Other Ambulatory Visit: Payer: Self-pay | Admitting: Internal Medicine

## 2022-05-31 ENCOUNTER — Other Ambulatory Visit: Payer: Medicare HMO

## 2022-05-31 DIAGNOSIS — I2510 Atherosclerotic heart disease of native coronary artery without angina pectoris: Secondary | ICD-10-CM | POA: Diagnosis not present

## 2022-05-31 DIAGNOSIS — E139 Other specified diabetes mellitus without complications: Secondary | ICD-10-CM | POA: Diagnosis not present

## 2022-06-01 LAB — CBC WITH DIFFERENTIAL/PLATELET
Basophils Absolute: 0.1 10*3/uL (ref 0.0–0.2)
Basos: 1 %
EOS (ABSOLUTE): 0.2 10*3/uL (ref 0.0–0.4)
Eos: 3 %
Hematocrit: 34.9 % (ref 34.0–46.6)
Hemoglobin: 11.3 g/dL (ref 11.1–15.9)
Immature Grans (Abs): 0 10*3/uL (ref 0.0–0.1)
Immature Granulocytes: 0 %
Lymphocytes Absolute: 2.4 10*3/uL (ref 0.7–3.1)
Lymphs: 32 %
MCH: 29.1 pg (ref 26.6–33.0)
MCHC: 32.4 g/dL (ref 31.5–35.7)
MCV: 90 fL (ref 79–97)
Monocytes Absolute: 0.7 10*3/uL (ref 0.1–0.9)
Monocytes: 9 %
Neutrophils Absolute: 4.1 10*3/uL (ref 1.4–7.0)
Neutrophils: 55 %
Platelets: 174 10*3/uL (ref 150–450)
RBC: 3.88 x10E6/uL (ref 3.77–5.28)
RDW: 12.5 % (ref 11.7–15.4)
WBC: 7.4 10*3/uL (ref 3.4–10.8)

## 2022-06-01 LAB — COMPREHENSIVE METABOLIC PANEL
ALT: 12 IU/L (ref 0–32)
AST: 24 IU/L (ref 0–40)
Albumin/Globulin Ratio: 1.7 (ref 1.2–2.2)
Albumin: 4.1 g/dL (ref 3.7–4.7)
Alkaline Phosphatase: 77 IU/L (ref 44–121)
BUN/Creatinine Ratio: 18 (ref 12–28)
BUN: 31 mg/dL — ABNORMAL HIGH (ref 8–27)
Bilirubin Total: 0.5 mg/dL (ref 0.0–1.2)
CO2: 24 mmol/L (ref 20–29)
Calcium: 10.3 mg/dL (ref 8.7–10.3)
Chloride: 103 mmol/L (ref 96–106)
Creatinine, Ser: 1.68 mg/dL — ABNORMAL HIGH (ref 0.57–1.00)
Globulin, Total: 2.4 g/dL (ref 1.5–4.5)
Glucose: 97 mg/dL (ref 70–99)
Potassium: 3.3 mmol/L — ABNORMAL LOW (ref 3.5–5.2)
Sodium: 143 mmol/L (ref 134–144)
Total Protein: 6.5 g/dL (ref 6.0–8.5)
eGFR: 30 mL/min/{1.73_m2} — ABNORMAL LOW (ref >59)

## 2022-06-01 LAB — LIPID PANEL W/O CHOL/HDL RATIO
Cholesterol, Total: 122 mg/dL (ref 100–199)
HDL: 57 mg/dL (ref >39)
LDL Chol Calc (NIH): 50 mg/dL (ref 0–99)
Triglycerides: 71 mg/dL (ref 0–149)
VLDL Cholesterol Cal: 15 mg/dL (ref 5–40)

## 2022-06-01 LAB — HGB A1C W/O EAG: Hgb A1c MFr Bld: 5.9 % — ABNORMAL HIGH (ref 4.8–5.6)

## 2022-06-03 ENCOUNTER — Ambulatory Visit (INDEPENDENT_AMBULATORY_CARE_PROVIDER_SITE_OTHER): Payer: Medicare HMO | Admitting: Internal Medicine

## 2022-06-03 VITALS — BP 158/70 | HR 62 | Ht 61.0 in | Wt 187.0 lb

## 2022-06-03 DIAGNOSIS — F330 Major depressive disorder, recurrent, mild: Secondary | ICD-10-CM

## 2022-06-03 DIAGNOSIS — N1832 Chronic kidney disease, stage 3b: Secondary | ICD-10-CM

## 2022-06-03 DIAGNOSIS — R600 Localized edema: Secondary | ICD-10-CM

## 2022-06-03 DIAGNOSIS — I10 Essential (primary) hypertension: Secondary | ICD-10-CM | POA: Diagnosis not present

## 2022-06-03 DIAGNOSIS — E119 Type 2 diabetes mellitus without complications: Secondary | ICD-10-CM

## 2022-06-03 DIAGNOSIS — E782 Mixed hyperlipidemia: Secondary | ICD-10-CM | POA: Diagnosis not present

## 2022-06-03 DIAGNOSIS — M5430 Sciatica, unspecified side: Secondary | ICD-10-CM

## 2022-06-03 DIAGNOSIS — F319 Bipolar disorder, unspecified: Secondary | ICD-10-CM | POA: Diagnosis not present

## 2022-06-03 MED ORDER — HYDRALAZINE HCL 50 MG PO TABS: 50.0000 mg | ORAL_TABLET | Freq: Three times a day (TID) | ORAL | 0 refills | Status: DC

## 2022-06-03 MED ORDER — GABAPENTIN 100 MG PO CAPS: 100.0000 mg | ORAL_CAPSULE | Freq: Two times a day (BID) | ORAL | 0 refills | Status: DC

## 2022-06-03 MED ORDER — AMLODIPINE BESYLATE 10 MG PO TABS: 10.0000 mg | ORAL_TABLET | Freq: Every day | ORAL | 0 refills | Status: DC

## 2022-06-03 MED ORDER — SERTRALINE HCL 50 MG PO TABS: 50.0000 mg | ORAL_TABLET | Freq: Every morning | ORAL | 0 refills | Status: DC

## 2022-06-03 MED ORDER — ROSUVASTATIN CALCIUM 40 MG PO TABS: 40.0000 mg | ORAL_TABLET | Freq: Every evening | ORAL | 0 refills | Status: DC

## 2022-06-03 MED ORDER — FUROSEMIDE 20 MG PO TABS: 20.0000 mg | ORAL_TABLET | Freq: Every day | ORAL | 0 refills | Status: DC | PRN

## 2022-06-03 MED ORDER — LISINOPRIL 10 MG PO TABS: 10.0000 mg | ORAL_TABLET | Freq: Every day | ORAL | 0 refills | Status: DC

## 2022-06-03 MED ORDER — PIOGLITAZONE HCL 15 MG PO TABS: 15.0000 mg | ORAL_TABLET | Freq: Every morning | ORAL | 0 refills | Status: DC

## 2022-06-03 NOTE — Progress Notes (Signed)
Established Patient Office Visit  Subjective:  Patient ID: [name], female    DOB: [birth year]  Age: 82 y.o. MRN: [id number]  Chief Complaint  Patient presents with   Follow-up    No new complaints, here for lab review and medication refills.Labs reviewed and notable for hypokalemia with A1c at 5.9 and LDL at target.  BP elevated today but home readings have been normal.     Past Medical History:  Diagnosis Date   Arthritis    "hands, feet, knees, shoulders" (05/05/2014)   Bipolar disorder ([facility])    Cataracts, bilateral    immature   Chronic kidney disease    "dr says they aren't functioning like they should" (05/05/2014)   Coronary artery disease    Depression    takes Zoloft daily   History of bronchitis 67yr ago   Hyperlipidemia    takes Atorvastatin daily   Hypertension    takes Metoprolol,Lisinopril,and Apresoline daily   Joint pain    Peripheral edema    takes Chlothalidone daily   Squamous cell cancer of skin of nose 2012   "right"   Squamous cell cancer of skin of nose    Type II diabetes mellitus ([place]    takes Tradjenta daily    Social History   Socioeconomic History   Marital status: Widowed    Spouse name: Not on file   Number of children: Not on file   Years of education: Not on file   Highest education level: Not on file  Occupational History   Not on file  Tobacco Use   Smoking status: Never   Smokeless tobacco: Never  Vaping Use   Vaping Use: Never used  Substance and Sexual Activity   Alcohol use: No    Alcohol/week: 0.0 standard drinks of alcohol   Drug use: No   Sexual activity: Not on file  Other Topics Concern   Not on file  Social History Narrative   Not on file   Social Determinants of Health   Financial Resource Strain: Not on file  Food Insecurity: Not on file  Transportation Needs: Not on file  Physical Activity: Not on file  Stress: Not on file  Social Connections: Not on file  Intimate Partner Violence:  Not on file    No family history on file.  Allergies  Allergen Reactions   Codeine Nausea And Vomiting    "Makes me deathly sick"    Review of Systems  Constitutional: Negative.   HENT: Negative.    Eyes: Negative.   Respiratory: Negative.    Cardiovascular: Negative.   Gastrointestinal: Negative.   Genitourinary: Negative.   Skin: Negative.   Neurological: Negative.   Endo/Heme/Allergies: Negative.        Objective:   BP (!) 158/70   Pulse 62   Ht 5' 1"$  (1.549 m)   Wt 187 lb (84.8 kg)   SpO2 94%   BMI 35.33 kg/m   Vitals:   06/03/22 1317  BP: (!) 158/70  Pulse: 62  Height: 5' 1"$  (1.549 m)  Weight: 187 lb (84.8 kg)  SpO2: 94%  BMI (Calculated): 35.35    Physical Exam Vitals reviewed.  Constitutional:      General: She is not in acute distress.    Appearance: She is obese.  HENT:     Head: Normocephalic.     Nose: Nose normal.     Mouth/Throat:     Mouth: Mucous membranes are moist.  Eyes:  Extraocular Movements: Extraocular movements intact.     Pupils: Pupils are equal, round, and reactive to light.  Cardiovascular:     Rate and Rhythm: Normal rate and regular rhythm.     Heart sounds: No murmur heard. Pulmonary:     Effort: Pulmonary effort is normal.     Breath sounds: No rhonchi or rales.  Abdominal:     General: Abdomen is flat.     Palpations: There is no hepatomegaly, splenomegaly or mass.  Musculoskeletal:        General: Normal range of motion.     Cervical back: Normal range of motion. No tenderness.  Skin:    General: Skin is warm and dry.  Neurological:     General: No focal deficit present.     Mental Status: She is alert and oriented to person, place, and time.     Cranial Nerves: No cranial nerve deficit.     Motor: No weakness.  Psychiatric:        Mood and Affect: Mood normal.        Behavior: Behavior normal.      No results found for any visits on 06/03/22.  Recent Results (from the past 2160 hour([facility]htar Shams))  CBC  with Differential/Platelet     Status: None   Collection Time: 05/31/22  9:24 AM  Result Value Ref Range   WBC 7.4 3.4 - 10.8 x10E3/uL   RBC 3.88 3.77 - 5.28 x10E6/uL   Hemoglobin 11.3 11.1 - 15.9 g/dL   Hematocrit 34.9 34.0 - 46.6 %   MCV 90 79 - 97 fL   MCH 29.1 26.6 - 33.0 pg   MCHC 32.4 31.5 - 35.7 g/dL   RDW 12.5 11.7 - 15.4 %   Platelets 174 150 - 450 x10E3/uL   Neutrophils 55 Not Estab. %   Lymphs 32 Not Estab. %   Monocytes 9 Not Estab. %   Eos 3 Not Estab. %   Basos 1 Not Estab. %   Neutrophils Absolute 4.1 1.4 - 7.0 x10E3/uL   Lymphocytes Absolute 2.4 0.7 - 3.1 x10E3/uL   Monocytes Absolute 0.7 0.1 - 0.9 x10E3/uL   EOS (ABSOLUTE) 0.2 0.0 - 0.4 x10E3/uL   Basophils Absolute 0.1 0.0 - 0.2 x10E3/uL   Immature Granulocytes 0 Not Estab. %   Immature Grans (Abs) 0.0 0.0 - 0.1 x10E3/uL  Comprehensive metabolic panel     Status: Abnormal   Collection Time: 05/31/22  9:24 AM  Result Value Ref Range   Glucose 97 70 - 99 mg/dL   BUN 31 (H) 8 - 27 mg/dL   Creatinine, Ser 1.68 (H) 0.57 - 1.00 mg/dL   eGFR 30 (L) >59 mL/min/1.73   BUN/Creatinine Ratio 18 12 - 28   Sodium 143 134 - 144 mmol/L   Potassium 3.3 (L) 3.5 - 5.2 mmol/L   Chloride 103 96 - 106 mmol/L   CO2 24 20 - 29 mmol/L   Calcium 10.3 8.7 - 10.3 mg/dL   Total Protein 6.5 6.0 - 8.5 g/dL   Albumin 4.1 3.7 - 4.7 g/dL   Globulin, Total 2.4 1.5 - 4.5 g/dL   Albumin/Globulin Ratio 1.7 1.2 - 2.2   Bilirubin Total 0.5 0.0 - 1.2 mg/dL   Alkaline Phosphatase 77 44 - 121 IU/L   AST 24 0 - 40 IU/L   ALT 12 0 - 32 IU/L  Lipid Panel w/o Chol/HDL Ratio     Status: None   Collection Time: 05/31/22  9:24 AM  Result Value  Ref Range   Cholesterol, Total 122 100 - 199 mg/dL   Triglycerides 71 0 - 149 mg/dL   HDL 57 >39 mg/dL   VLDL Cholesterol Cal 15 5 - 40 mg/dL   LDL Chol Calc (NIH) 50 0 - 99 mg/dL  Hgb A1c w/o eAG     Status: Abnormal   Collection Time: 05/31/22  9:24 AM  Result Value Ref Range   Hgb A1c MFr Bld 5.9 (H)  4.8 - 5.6 %    Comment:          Prediabetes: 5.7 - 6.4          Diabetes: >6.4          Glycemic control for adults with diabetes: <7.0       Assessment & Plan:   Problem List Items Addressed This Visit   None 1. Essential hypertension - Comprehensive metabolic panel; Future  2. Type 2 diabetes mellitus without complication, without long-term current use of insulin (HCC) - Hemoglobin A1c; Future - pioglitazone (ACTOS) 15 MG tablet; Take 1 tablet (15 mg total) by mouth every morning.  Dispense: 90 tablet; Refill: 0  3. Mixed hyperlipidemia - Lipid panel; Future - CK, total; Future  4. Stage 3b chronic kidney disease ([place])  5. Localized edema    No follow-ups on file.   Total time spent: 20 minutes  Volanda Napoleon, MD  06/03/2022

## 2022-06-27 ENCOUNTER — Ambulatory Visit: Payer: Medicare HMO | Admitting: Cardiovascular Disease

## 2022-06-27 ENCOUNTER — Encounter: Payer: Self-pay | Admitting: Cardiovascular Disease

## 2022-06-27 VITALS — BP 118/56 | HR 73 | Ht 64.0 in | Wt 189.0 lb

## 2022-06-27 DIAGNOSIS — I10 Essential (primary) hypertension: Secondary | ICD-10-CM | POA: Diagnosis not present

## 2022-06-27 DIAGNOSIS — I2510 Atherosclerotic heart disease of native coronary artery without angina pectoris: Secondary | ICD-10-CM | POA: Insufficient documentation

## 2022-06-27 DIAGNOSIS — E782 Mixed hyperlipidemia: Secondary | ICD-10-CM | POA: Diagnosis not present

## 2022-06-27 NOTE — Assessment & Plan Note (Signed)
Patient doing well. No complaints.  

## 2022-06-27 NOTE — Progress Notes (Signed)
Cardiology Office Note   Date:  06/27/2022   ID:  AFI CHESTANG, DOB 10-21-1940, MRN [id number]  PCP:  Jodi Marble, MD  Cardiologist:  Neoma Laming, MD      History of Present Illness: [name] is a 82 y.o. female who presents for  Chief Complaint  Patient presents with   Follow-up    4 month follow up    Patient in office for routine cardiac exam. Denies chest pain, shortness of breath, edema, palpitations.      Past Medical History:  Diagnosis Date   Arthritis    "hands, feet, knees, shoulders" (05/05/2014)   Bipolar disorder ([place])    Cataracts, bilateral    immature   Chronic kidney disease    "dr says they aren't functioning like they should" (05/05/2014)   Coronary artery disease    Depression    takes Zoloft daily   History of bronchitis 41yr ago   Hyperlipidemia    takes Atorvastatin daily   Hypertension    takes Metoprolol,Lisinopril,and Apresoline daily   Joint pain    Peripheral edema    takes Chlothalidone daily   Squamous cell cancer of skin of nose 2012   "right"   Squamous cell cancer of skin of nose    [place] II diabetes mellitus ([facility]    takes Tradjenta daily     Past Surgical History:  Procedure Laterality Date   COLONOSCOPY WITH PROPOFOL N/A 02/03/2018   Procedure: COLONOSCOPY WITH PROPOFOL;  Surgeon: SLollie Sails MD;  [facility];  Service: Endoscopy;  Laterality: N/A;   CORONARY ARTERY BYPASS GRAFT  05/12/2007   [place]   coronary artery bypass with arterial grafts     DILATION AND CURETTAGE OF UTERUS  2005   FRACTURE SURGERY     I & D EXTREMITY Right 04/30/2014   Procedure: IRRIGATION AND DEBRIDEMENT EXTREMITY;  Surgeon: JWylene Simmer MD;  [place] [facility]  Service: Orthopedics;  Laterality: Right;   INCISION AND DRAINAGE Right 08/11/2014   Procedure: REMOVAL OF DEEP IMPLANTS OF RIGHT TIBIAL FIBIAL INCISION AND DRAINAGE OF RIGHT ANKLE WOUND;  Surgeon: JWylene Simmer MD;  [place] [facility]   Service: Orthopedics;  Laterality: Right;   ORIF ANKLE FRACTURE Right 04/30/2014   Procedure: OPEN REDUCTION INTERNAL FIXATION (ORIF) ANKLE FRACTURE;  Surgeon: JWylene Simmer MD;  [place] [facility]  Service: Orthopedics;  Laterality: Right;   REMOVAL OF IMPLANT Right 08/11/2014   Procedure: REMOVAL OF DEEP IMPLANTS OF RIGHT TIBIAL FIBIAL INCISION AND DRAINAGE OF RIGHT ANKLE WOUND;  Surgeon: JWylene Simmer MD;  [place] [facility]  Service: Orthopedics;  Laterality: Right;   SQUAMOUS CELL CARCINOMA EXCISION Right 2012   "nose"   VAGINAL HYSTERECTOMY  2007     Current Outpatient Medications  Medication Sig Dispense Refill   amLODipine (NORVASC) 10 MG tablet Take 1 tablet (10 mg total) by mouth daily. 90 tablet 0   aspirin EC 325 MG tablet Take 325 mg by mouth daily.     Calcium Carb-Cholecalciferol (CALTRATE 600+D3 SOFT) 600-800 MG-UNIT CHEW Chew by mouth.     carvedilol (COREG) 6.25 MG tablet Take 6.25 mg by mouth 2 (two) times daily with a meal.     cetirizine (ZYRTEC) 10 MG tablet Take 10 mg by mouth daily.      chlorthalidone (HYGROTON) 25 MG tablet Take 25 mg by mouth daily.   2   Cholecalciferol (VITAMIN D3) 5000 units TABS Take 1 tablet by mouth  daily.     diclofenac sodium (VOLTAREN) 1 % GEL Apply 2 g topically as needed.      ferrous sulfate 325 (65 FE) MG tablet Take 1 tablet by mouth daily.     fluticasone (FLONASE) 50 MCG/ACT nasal spray Place 1 spray into both nostrils daily.     furosemide (LASIX) 20 MG tablet Take 1 tablet (20 mg total) by mouth daily as needed. 90 tablet 0   gabapentin (NEURONTIN) 100 MG capsule Take 1 capsule (100 mg total) by mouth 2 (two) times daily. 180 capsule 0   hydrALAZINE (APRESOLINE) 50 MG tablet Take 1 tablet (50 mg total) by mouth 3 (three) times daily. 270 tablet 0   lisinopril (ZESTRIL) 10 MG tablet Take 1 tablet (10 mg total) by mouth daily. 90 tablet 0   pioglitazone (ACTOS) 15 MG tablet Take 1 tablet (15 mg total) by mouth every morning. 90 tablet 0    rosuvastatin (CRESTOR) 40 MG tablet Take 1 tablet (40 mg total) by mouth at bedtime. 90 tablet 0   sertraline (ZOLOFT) 50 MG tablet Take 1 tablet (50 mg total) by mouth every morning. 90 tablet 0   No current facility-administered medications for this visit.    Allergies:   Codeine    Social History:   reports that she has never smoked. She has never used smokeless tobacco. She reports that she does not drink alcohol and does not use drugs.   Family History:  family history is not on file.    ROS:     Review of Systems  Constitutional: Negative.   HENT: Negative.    Eyes: Negative.   Respiratory: Negative.    Cardiovascular: Negative.   Gastrointestinal: Negative.   Genitourinary: Negative.   Musculoskeletal: Negative.   Skin: Negative.   Neurological: Negative.   Endo/Heme/Allergies: Negative.   Psychiatric/Behavioral: Negative.    All other systems reviewed and are negative.   All other systems are reviewed and negative.   PHYSICAL EXAM: VS:  BP (!) 118/56   Pulse 73   Ht '5\' 4"'$  (1.626 m)   Wt 189 lb (85.7 kg)   SpO2 90%   BMI 32.44 kg/m  , BMI Body mass index is 32.44 kg/m. Last weight:  Wt Readings from Last 3 Encounters:  06/27/22 189 lb (85.7 kg)  06/03/22 187 lb (84.8 kg)  10/05/20 196 lb 6.9 oz (89.1 kg)     Physical Exam Constitutional:      Appearance: Normal appearance.  Cardiovascular:     Rate and Rhythm: Normal rate and regular rhythm.     Heart sounds: Normal heart sounds.  Pulmonary:     Effort: Pulmonary effort is normal.     Breath sounds: Normal breath sounds.  Musculoskeletal:     Right lower leg: No edema.     Left lower leg: No edema.  Neurological:     Mental Status: She is alert.     EKG: none today  Recent Labs: 05/31/2022: ALT 12; BUN 31; Creatinine, Ser 1.68; Hemoglobin 11.3; Platelets 174; Potassium 3.3; Sodium 143    Lipid Panel    Component Value Date/Time   CHOL 122 05/31/2022 0924   TRIG 71 05/31/2022 0924    HDL 57 05/31/2022 0924   LDLCALC 50 05/31/2022 0924      Other studies Reviewed: Patient: [place] DOB:  1940/04/18  Date:  01/10/2021 14:00 Provider: Neoma Laming MD Encounter: ECHO   Page 2 REASON FOR VISIT  Visit for:  Echocardiogram/Hypertension  Sex:   Female   wt= 192   lbs.  BP= 126/66  Height= 63.5   inches.   TESTS  Imaging: Echocardiogram:  An echocardiogram in (2-d) mode was performed and in Doppler mode with color flow velocity mapping was performed. Aortic valve flow velocity 1.4  m/s and systolic calculated mean flow gradient 4  mmHg. Mitral valve diastolic peak flow velocity E .76    m/s and E/A ratio 0.7. Aortic root diameter 2.9   cm. The LVOT internal diameter 1.8  cm and flow velocity was abnormal 1.0   m/s. LV systolic dimension 2.6    cm, diastolic 3.5  cm, posterior wall thickness 0.9  cm, fractional shortening 25 %, and EF 50 %. IVS thickness 0.8  cm. LA dimension 4.4 cm  RIGHT atrium= 14.0   cm2. Mitral Valve has Trace Regurgitation. Pulmonic Valve has Trace Regurgitation. Tricuspid Valve has Trace Regurgitation.    ASSESSMENT  Technically adequate study.  Normal chamber sizes.  Normal left ventricular systolic function..  Normal right ventricular systolic function.  Normal right ventricular diastolic function.  Normal left ventricular wall motion.  Normal right ventricular wall motion.  Trace pulmonary regurgitation.  Trace tricuspid regurgitation.  Normal pulmonary artery pressure.  Trace mitral regurgitation.  No pericardial effusion.  No LVH.   THERAPY   Referring physician: Dionisio David  Sonographer: Stu.   Neoma Laming MD  Electronically signed by: Neoma Laming     Date: 01/15/2021 13:57  Patient: [place] DOB:  05-21-1940  Date:  01/06/2020 10:30 Provider: Neoma Laming MD Encounter: ALL ANGIOGRAMS (CTA BRAIN, CAROTIDS, RENAL ARTERIES, PE)   Page 2 REASON FOR VISIT  Referred by  Dr.Tyreese Thain Humphrey Rolls.   TESTS  Imaging: Computed Tomographic Angiography:  Cardiac multidetector CT was performed paying particular attention to the coronary arteries for the diagnosis of: Ventricular tachycardia. Diagnostic Drugs:  Administered iohexol (Omnipaque) through an antecubital vein and images from the examination were analyzed for the presence and extent of coronary artery disease, using 3D image processing software. 100 mL of non-ionic contrast (Omnipaque) was used.    ASSESSMENT   The SVG origin was visualized To RCA, Patent   TEST CONCLUSIONS  Quality of study: Fair.  1-Calcium score: 1298.9  2-Right dominant system.  3-SVG to PDA is patent. LAD and LCX appear to have mild to moderate disease. Consider Cath if having chest pain.   Neoma Laming MD  Electronically signed by: Neoma Laming     Date: 01/07/2020 15:35  Patient: [place] DOB:  1940-12-30  Date:  12/28/2019 08:00 Provider: Neoma Laming MD Encounter: NUCLEAR STRESS TEST   Page 2 TESTS    [facility] [address] [place], [place] [address] [phone] STUDY:  Gated Stress / Rest Myocardial Perfusion Imaging Tomographic (SPECT) Including attenuation correction Wall Motion, Left Ventricular Ejection Fraction By Gated Technique.Persantine Stress Test. SEX: Female     WEIGHT: 205 lbs    HEIGHT: 65 in                ARMS UP: YES/NO  REFERRING PHYSICIAN:  Dr.Jeriko Kowalke Humphrey Rolls                                                                                                                                                                                                                      INDICATION FOR STUDY: CAD                                                                                                                                                                                                                     TECHNIQUE:  Approximately 20 minutes following the intravenous administration of 10.6 mCi of Tc-9mSestamibi after stress testing in a reclined supine position with arms above their head if able to do so, gated SPECT imaging of the heart was performed. After about a 2hr break, the patient was injected intravenously with 33.2 mCi of Tc-928mestamibi.  Approximately 45 minutes later in the same position as stress imaging SPECT rest imaging of the heart was performed.  STRESS BY:  ShNeoma LamingMD PROTOCOL:  Persantine   DOSE ADMIN:  10 cc      ROUTE OF ADMINISTRATION: IV  MAX PRED HR: 141                     85%: 120               75%: 106                                                                                                                   RESTING BP: 120/82    RESTING HR: 59   PEAK BP: 100/66      PEAK HR:  70                                                                   EXERCISE DURATION:    4 min injection                                            REASON FOR TEST TERMINATION:    Protocol end                                                                                                                              SYMPTOMS:   None  EKG RESULTS:  NSR. 70/min. 1st degree AV block, prolonged QVC, 7 beat run of V Tach, frequent PVC's. No significant ST changes with persantine.                                                             IMAGE QUALITY: Fair                                                                                                                                                                                                                                                                                                                                  PERFUSION/WALL MOTION FINDINGS: EF = 77%. Moderate size and intensity partially reversible apical anterior, apical septal, apical lateral and apex (17) wall defects, normal wall motion.                                                                           IMPRESSION:  Possible ischemia in the LAD territory, non sustained V Tach during persantine infusion with normal LVEF, advise CCTA.  Neoma Laming, MD Stress Interpreting Physician / Nuclear Interpreting Physician        Neoma Laming MD  Electronically signed by: Neoma Laming     Date: 12/30/2019 14:52   ASSESSMENT AND PLAN:    ICD-10-CM   1. Coronary artery disease involving native coronary artery of native heart without angina pectoris  I25.10     2. Essential hypertension  I10     3. Mixed hyperlipidemia  E78.2        Problem List Items Addressed This Visit       Cardiovascular and Mediastinum   Essential hypertension (Chronic)   Relevant Medications   carvedilol (COREG) 6.25 MG tablet   Coronary artery disease involving native coronary artery of native heart without angina pectoris - Primary    Patient doing well. No complaints.       Relevant Medications   carvedilol (COREG) 6.25 MG tablet     Other   Hyperlipidemia (Chronic)   Relevant Medications   carvedilol (COREG) 6.25 MG tablet     Disposition:   Return in about 4 months (around 10/27/2022).    Total time spent: 30 minutes  Signed,  Neoma Laming, MD  06/27/2022 [place]

## 2022-07-15 ENCOUNTER — Other Ambulatory Visit: Payer: Self-pay | Admitting: Internal Medicine

## 2022-07-18 DIAGNOSIS — N184 Chronic kidney disease, stage 4 (severe): Secondary | ICD-10-CM | POA: Diagnosis not present

## 2022-07-18 DIAGNOSIS — I10 Essential (primary) hypertension: Secondary | ICD-10-CM | POA: Diagnosis not present

## 2022-07-18 DIAGNOSIS — N2581 Secondary hyperparathyroidism of renal origin: Secondary | ICD-10-CM | POA: Diagnosis not present

## 2022-07-18 DIAGNOSIS — D631 Anemia in chronic kidney disease: Secondary | ICD-10-CM | POA: Diagnosis not present

## 2022-07-18 DIAGNOSIS — E1122 Type 2 diabetes mellitus with diabetic chronic kidney disease: Secondary | ICD-10-CM | POA: Diagnosis not present

## 2022-07-22 DIAGNOSIS — D631 Anemia in chronic kidney disease: Secondary | ICD-10-CM | POA: Diagnosis not present

## 2022-07-22 DIAGNOSIS — E1122 Type 2 diabetes mellitus with diabetic chronic kidney disease: Secondary | ICD-10-CM | POA: Diagnosis not present

## 2022-07-22 DIAGNOSIS — I10 Essential (primary) hypertension: Secondary | ICD-10-CM | POA: Diagnosis not present

## 2022-07-22 DIAGNOSIS — N184 Chronic kidney disease, stage 4 (severe): Secondary | ICD-10-CM | POA: Diagnosis not present

## 2022-07-22 DIAGNOSIS — N2581 Secondary hyperparathyroidism of renal origin: Secondary | ICD-10-CM | POA: Diagnosis not present

## 2022-08-11 ENCOUNTER — Other Ambulatory Visit: Payer: Self-pay | Admitting: Internal Medicine

## 2022-08-11 DIAGNOSIS — E782 Mixed hyperlipidemia: Secondary | ICD-10-CM

## 2022-08-11 DIAGNOSIS — S2241XS Multiple fractures of ribs, right side, sequela: Secondary | ICD-10-CM

## 2022-08-29 ENCOUNTER — Other Ambulatory Visit: Payer: Medicare HMO

## 2022-08-29 DIAGNOSIS — E782 Mixed hyperlipidemia: Secondary | ICD-10-CM | POA: Diagnosis not present

## 2022-08-29 DIAGNOSIS — I10 Essential (primary) hypertension: Secondary | ICD-10-CM | POA: Diagnosis not present

## 2022-08-29 DIAGNOSIS — E119 Type 2 diabetes mellitus without complications: Secondary | ICD-10-CM

## 2022-08-30 LAB — CBC WITH DIFFERENTIAL
Basophils Absolute: 0.1 10*3/uL (ref 0.0–0.2)
Basos: 1 %
EOS (ABSOLUTE): 0.3 10*3/uL (ref 0.0–0.4)
Eos: 3 %
Hematocrit: 33.5 % — ABNORMAL LOW (ref 34.0–46.6)
Hemoglobin: 10.9 g/dL — ABNORMAL LOW (ref 11.1–15.9)
Immature Grans (Abs): 0 10*3/uL (ref 0.0–0.1)
Immature Granulocytes: 0 %
Lymphocytes Absolute: 1.9 10*3/uL (ref 0.7–3.1)
Lymphs: 25 %
MCH: 29.9 pg (ref 26.6–33.0)
MCHC: 32.5 g/dL (ref 31.5–35.7)
MCV: 92 fL (ref 79–97)
Monocytes Absolute: 0.7 10*3/uL (ref 0.1–0.9)
Monocytes: 9 %
Neutrophils Absolute: 4.7 10*3/uL (ref 1.4–7.0)
Neutrophils: 62 %
RBC: 3.65 x10E6/uL — ABNORMAL LOW (ref 3.77–5.28)
RDW: 13 % (ref 11.7–15.4)
WBC: 7.7 10*3/uL (ref 3.4–10.8)

## 2022-08-30 LAB — CMP14+EGFR
ALT: 12 IU/L (ref 0–32)
AST: 24 IU/L (ref 0–40)
Albumin/Globulin Ratio: 2.1 (ref 1.2–2.2)
Albumin: 4.2 g/dL (ref 3.7–4.7)
Alkaline Phosphatase: 67 IU/L (ref 44–121)
BUN/Creatinine Ratio: 21 (ref 12–28)
BUN: 38 mg/dL — ABNORMAL HIGH (ref 8–27)
Bilirubin Total: 0.4 mg/dL (ref 0.0–1.2)
CO2: 24 mmol/L (ref 20–29)
Calcium: 10.1 mg/dL (ref 8.7–10.3)
Chloride: 103 mmol/L (ref 96–106)
Creatinine, Ser: 1.79 mg/dL — ABNORMAL HIGH (ref 0.57–1.00)
Globulin, Total: 2 g/dL (ref 1.5–4.5)
Glucose: 122 mg/dL — ABNORMAL HIGH (ref 70–99)
Potassium: 4.1 mmol/L (ref 3.5–5.2)
Sodium: 141 mmol/L (ref 134–144)
Total Protein: 6.2 g/dL (ref 6.0–8.5)
eGFR: 28 mL/min/{1.73_m2} — ABNORMAL LOW (ref >59)

## 2022-08-30 LAB — LIPID PANEL
Chol/HDL Ratio: 2.1 ratio (ref 0.0–4.4)
Cholesterol, Total: 111 mg/dL (ref 100–199)
HDL: 54 mg/dL (ref >39)
LDL Chol Calc (NIH): 44 mg/dL (ref 0–99)
Triglycerides: 61 mg/dL (ref 0–149)
VLDL Cholesterol Cal: 13 mg/dL (ref 5–40)

## 2022-08-30 LAB — TSH: TSH: 2.76 u[IU]/mL (ref 0.450–4.500)

## 2022-08-30 LAB — HEMOGLOBIN A1C
Est. average glucose Bld gHb Est-mCnc: 117 mg/dL
Hgb A1c MFr Bld: 5.7 % — ABNORMAL HIGH (ref 4.8–5.6)

## 2022-09-02 ENCOUNTER — Encounter: Payer: Self-pay | Admitting: Internal Medicine

## 2022-09-02 ENCOUNTER — Ambulatory Visit (INDEPENDENT_AMBULATORY_CARE_PROVIDER_SITE_OTHER): Payer: Medicare HMO | Admitting: Internal Medicine

## 2022-09-02 DIAGNOSIS — E119 Type 2 diabetes mellitus without complications: Secondary | ICD-10-CM

## 2022-09-02 DIAGNOSIS — M5430 Sciatica, unspecified side: Secondary | ICD-10-CM

## 2022-09-02 DIAGNOSIS — F330 Major depressive disorder, recurrent, mild: Secondary | ICD-10-CM

## 2022-09-02 DIAGNOSIS — I10 Essential (primary) hypertension: Secondary | ICD-10-CM | POA: Diagnosis not present

## 2022-09-02 MED ORDER — GABAPENTIN 100 MG PO CAPS
100.0000 mg | ORAL_CAPSULE | Freq: Two times a day (BID) | ORAL | 0 refills | Status: DC
Start: 2022-09-02 — End: 2022-12-09

## 2022-09-02 MED ORDER — SERTRALINE HCL 50 MG PO TABS: 50.0000 mg | ORAL_TABLET | Freq: Every morning | ORAL | 0 refills | Status: DC

## 2022-09-02 MED ORDER — AMLODIPINE BESYLATE 10 MG PO TABS
10.0000 mg | ORAL_TABLET | Freq: Every day | ORAL | 0 refills | Status: DC
Start: 2022-09-02 — End: 2023-02-14

## 2022-09-02 MED ORDER — PIOGLITAZONE HCL 15 MG PO TABS
15.0000 mg | ORAL_TABLET | Freq: Every morning | ORAL | 0 refills | Status: DC
Start: 2022-09-02 — End: 2022-12-09

## 2022-09-02 MED ORDER — LISINOPRIL 10 MG PO TABS
10.0000 mg | ORAL_TABLET | Freq: Every day | ORAL | 0 refills | Status: DC
Start: 2022-09-02 — End: 2022-12-09

## 2022-09-02 MED ORDER — HYDRALAZINE HCL 50 MG PO TABS
50.0000 mg | ORAL_TABLET | Freq: Three times a day (TID) | ORAL | 0 refills | Status: DC
Start: 2022-09-02 — End: 2023-04-11

## 2022-09-02 MED ORDER — FUROSEMIDE 20 MG PO TABS: 20.0000 mg | ORAL_TABLET | Freq: Every day | ORAL | 0 refills | Status: DC | PRN

## 2022-09-02 NOTE — Progress Notes (Signed)
Established Patient Office Visit  Subjective:  Patient ID: [name], female    DOB: August 13, 1940  Age: 82 y.o. MRN: [id number]  Chief Complaint  Patient presents with   Follow-up    3 month lab results    No new complaints, here for lab review and medication refills. Labs reviewed and notable for well controlled diabetes, A1c at target, lipids at target with cmp notable for deteriorating renal function. Denies any hypoglycemic episodes and home bg readings have been at target. Recently followed up with Nephrology.    No other concerns at this time.   Past Medical History:  Diagnosis Date   Arthritis    "hands, feet, knees, shoulders" (05/05/2014)   Bipolar disorder ([facility])    Cataracts, bilateral    immature   Chronic kidney disease    "dr says they aren't functioning like they should" (05/05/2014)   Coronary artery disease    Depression    takes Zoloft daily   History of bronchitis 17yrs ago   Hyperlipidemia    takes Atorvastatin daily   Hypertension    takes Metoprolol,Lisinopril,and Apresoline daily   Joint pain    Peripheral edema    takes Chlothalidone daily   Squamous cell cancer of skin of nose 2012   "right"   Squamous cell cancer of skin of nose    Type II diabetes mellitus ([facility])    takes Tradjenta daily    Past Surgical History:  Procedure Laterality Date   COLONOSCOPY WITH PROPOFOL N/A 02/03/2018   Procedure: COLONOSCOPY WITH PROPOFOL;  Surgeon: Christena Deem, MD;  [facility];  Service: Endoscopy;  Laterality: N/A;   CORONARY ARTERY BYPASS GRAFT  05/12/2007   "CABG X1; at [facility]"   coronary artery bypass with arterial grafts     DILATION AND CURETTAGE OF UTERUS  2005   FRACTURE SURGERY     I & D EXTREMITY Right 04/30/2014   Procedure: IRRIGATION AND DEBRIDEMENT EXTREMITY;  Surgeon: Toni Arthurs, MD;  [facility];  Service: Orthopedics;  Laterality: Right;   INCISION AND DRAINAGE Right 08/11/2014   Procedure: REMOVAL OF DEEP  IMPLANTS OF RIGHT TIBIAL FIBIAL INCISION AND DRAINAGE OF RIGHT ANKLE WOUND;  Surgeon: Toni Arthurs, MD;  Location[facility];  Service: Orthopedics;  Laterality: Right;   ORIF ANKLE FRACTURE Right 04/30/2014   Procedure: OPEN REDUCTION INTERNAL FIXATION (ORIF) ANKLE FRACTURE;  Surgeon: Toni Arthurs, MD;  [facility];  Service: Orthopedics;  Laterality: Right;   REMOVAL OF IMPLANT Right 08/11/2014   Procedure: REMOVAL OF DEEP IMPLANTS OF RIGHT TIBIAL FIBIAL INCISION AND DRAINAGE OF RIGHT ANKLE WOUND;  Surgeon: Toni Arthurs, MD;  [facility];  Service: Orthopedics;  Laterality: Right;   SQUAMOUS CELL CARCINOMA EXCISION Right 2012   "nose"   VAGINAL HYSTERECTOMY  2007    Social History   Socioeconomic History   Marital status: Widowed    Spouse name: Not on file   Number of children: Not on file   Years of education: Not on file   Highest education level: Not on file  Occupational History   Not on file  Tobacco Use   Smoking status: Never   Smokeless tobacco: Never  Vaping Use   Vaping Use: Never used  Substance and Sexual Activity   Alcohol use: No    Alcohol/week: 0.0 standard drinks of alcohol   Drug use: No   Sexual activity: Not on file  Other Topics Concern   Not on file  Social  History Narrative   Not on file   Social Determinants of Health   Financial Resource Strain: Not on file  Food Insecurity: Not on file  Transportation Needs: Not on file  Physical Activity: Not on file  Stress: Not on file  Social Connections: Not on file  Intimate Partner Violence: Not on file    No family history on file.  Allergies  Allergen Reactions   Codeine Nausea And Vomiting    "Makes me deathly sick"    Review of Systems  Constitutional: Negative.   HENT: Negative.    Eyes: Negative.   Respiratory: Negative.    Cardiovascular: Negative.   Gastrointestinal: Negative.   Genitourinary: Negative.   Skin: Negative.   Neurological: Negative.   Endo/Heme/Allergies:  Negative.        Objective:   BP (!) 110/50   Pulse (!) 58   Ht 5\' 4"  (1.626 m)   Wt 185 lb (83.9 kg)   SpO2 96%   BMI 31.76 kg/m   Vitals:   09/02/22 1331  BP: (!) 110/50  Pulse: (!) 58  Height: 5\' 4"  (1.626 m)  Weight: 185 lb (83.9 kg)  SpO2: 96%  BMI (Calculated): 31.74    Physical Exam Vitals reviewed.  Constitutional:      General: She is not in acute distress.    Appearance: She is obese.  HENT:     Head: Normocephalic.     Nose: Nose normal.     Mouth/Throat:     Mouth: Mucous membranes are moist.  Eyes:     Extraocular Movements: Extraocular movements intact.     Pupils: Pupils are equal, round, and reactive to light.  Cardiovascular:     Rate and Rhythm: Normal rate and regular rhythm.     Heart sounds: No murmur heard. Pulmonary:     Effort: Pulmonary effort is normal.     Breath sounds: No rhonchi or rales.  Abdominal:     General: Abdomen is flat.     Palpations: There is no hepatomegaly, splenomegaly or mass.  Musculoskeletal:        General: Normal range of motion.     Cervical back: Normal range of motion. No tenderness.  Skin:    General: Skin is warm and dry.  Neurological:     General: No focal deficit present.     Mental Status: She is alert and oriented to person, place, and time.     Cranial Nerves: No cranial nerve deficit.     Motor: No weakness.  Psychiatric:        Mood and Affect: Mood normal.        Behavior: Behavior normal.      No results found for any visits on 09/02/22.  Recent Results (from the past 2160 hour(Gaye Scorza))  Hemoglobin A1c     Status: Abnormal   Collection Time: 08/29/22  1:10 PM  Result Value Ref Range   Hgb A1c MFr Bld 5.7 (H) 4.8 - 5.6 %    Comment:          Prediabetes: 5.7 - 6.4          Diabetes: >6.4          Glycemic control for adults with diabetes: <7.0    Est. average glucose Bld gHb Est-mCnc 117 mg/dL  TSH     Status: None   Collection Time: 08/29/22  1:10 PM  Result Value Ref Range   TSH  2.760 0.450 - 4.500 uIU/mL  CMP14+EGFR  Status: Abnormal   Collection Time: 08/29/22  1:10 PM  Result Value Ref Range   Glucose 122 (H) 70 - 99 mg/dL   BUN 38 (H) 8 - 27 mg/dL   Creatinine, Ser 1.61 (H) 0.57 - 1.00 mg/dL   eGFR 28 (L) >09 UE/AVW/0.98   BUN/Creatinine Ratio 21 12 - 28   Sodium 141 134 - 144 mmol/L   Potassium 4.1 3.5 - 5.2 mmol/L   Chloride 103 96 - 106 mmol/L   CO2 24 20 - 29 mmol/L   Calcium 10.1 8.7 - 10.3 mg/dL   Total Protein 6.2 6.0 - 8.5 g/dL   Albumin 4.2 3.7 - 4.7 g/dL   Globulin, Total 2.0 1.5 - 4.5 g/dL   Albumin/Globulin Ratio 2.1 1.2 - 2.2   Bilirubin Total 0.4 0.0 - 1.2 mg/dL   Alkaline Phosphatase 67 44 - 121 IU/L   AST 24 0 - 40 IU/L   ALT 12 0 - 32 IU/L  Lipid panel     Status: None   Collection Time: 08/29/22  1:10 PM  Result Value Ref Range   Cholesterol, Total 111 100 - 199 mg/dL   Triglycerides 61 0 - 149 mg/dL   HDL 54 >11 mg/dL   VLDL Cholesterol Cal 13 5 - 40 mg/dL   LDL Chol Calc (NIH) 44 0 - 99 mg/dL   Chol/HDL Ratio 2.1 0.0 - 4.4 ratio    Comment:                                   T. Chol/HDL Ratio                                             Men  Women                               1/2 Avg.Risk  3.4    3.3                                   Avg.Risk  5.0    4.4                                2X Avg.Risk  9.6    7.1                                3X Avg.Risk 23.4   11.0   CBC With Differential     Status: Abnormal   Collection Time: 08/29/22  1:10 PM  Result Value Ref Range   WBC 7.7 3.4 - 10.8 x10E3/uL   RBC 3.65 (L) 3.77 - 5.28 x10E6/uL   Hemoglobin 10.9 (L) 11.1 - 15.9 g/dL   Hematocrit 91.4 (L) 78.2 - 46.6 %   MCV 92 79 - 97 fL   MCH 29.9 26.6 - 33.0 pg   MCHC 32.5 31.5 - 35.7 g/dL   RDW 95.6 21.3 - 08.6 %   Neutrophils 62 Not Estab. %   Lymphs 25 Not Estab. %   Monocytes 9 Not Estab. %   Eos 3  Not Estab. %   Basos 1 Not Estab. %   Neutrophils Absolute 4.7 1.4 - 7.0 x10E3/uL   Lymphocytes Absolute 1.9 0.7 - 3.1  x10E3/uL   Monocytes Absolute 0.7 0.1 - 0.9 x10E3/uL   EOS (ABSOLUTE) 0.3 0.0 - 0.4 x10E3/uL   Basophils Absolute 0.1 0.0 - 0.2 x10E3/uL   Immature Granulocytes 0 Not Estab. %   Immature Grans (Abs) 0.0 0.0 - 0.1 x10E3/uL      Assessment & Plan:   Problem List Items Addressed This Visit   None   No follow-ups on file.   Total time spent: 20 minutes  Luna Fuse, MD  09/02/2022   This document may have been prepared by [facility] Voice Recognition software and as such may include unintentional dictation errors.

## 2022-09-03 ENCOUNTER — Ambulatory Visit: Payer: Medicare HMO | Admitting: Internal Medicine

## 2022-10-01 ENCOUNTER — Ambulatory Visit (INDEPENDENT_AMBULATORY_CARE_PROVIDER_SITE_OTHER): Payer: Medicare HMO | Admitting: Cardiovascular Disease

## 2022-10-01 ENCOUNTER — Encounter: Payer: Self-pay | Admitting: Cardiovascular Disease

## 2022-10-01 VITALS — BP 110/60 | HR 54 | Ht 64.0 in | Wt 184.0 lb

## 2022-10-01 DIAGNOSIS — I10 Essential (primary) hypertension: Secondary | ICD-10-CM | POA: Diagnosis not present

## 2022-10-01 DIAGNOSIS — E782 Mixed hyperlipidemia: Secondary | ICD-10-CM | POA: Diagnosis not present

## 2022-10-01 DIAGNOSIS — I2510 Atherosclerotic heart disease of native coronary artery without angina pectoris: Secondary | ICD-10-CM

## 2022-10-01 DIAGNOSIS — E119 Type 2 diabetes mellitus without complications: Secondary | ICD-10-CM

## 2022-10-01 DIAGNOSIS — R55 Syncope and collapse: Secondary | ICD-10-CM | POA: Diagnosis not present

## 2022-10-01 NOTE — Progress Notes (Signed)
Cardiology Office Note   Date:  10/01/2022   ID:  [name], DOB July 05, 1940, MRN [id number]  PCP:  Sherron Monday, MD  Cardiologist:  Adrian Blackwater, MD      History of Present Illness: [name] is a 82 y.o. female who presents for  Chief Complaint  Patient presents with   Follow-up    Follow Up    Feeling fine      Past Medical History:  Diagnosis Date   Arthritis    "hands, feet, knees, shoulders" (05/05/2014)   Bipolar disorder (HCC)    Cataracts, bilateral    immature   Chronic kidney disease    "dr says they aren't functioning like they should" (05/05/2014)   Coronary artery disease    Depression    takes Zoloft daily   History of bronchitis 57yrs ago   Hyperlipidemia    takes Atorvastatin daily   Hypertension    takes Metoprolol,Lisinopril,and Apresoline daily   Joint pain    Peripheral edema    takes Chlothalidone daily   Squamous cell cancer of skin of nose 2012   "right"   Squamous cell cancer of skin of nose    Type II diabetes mellitus (HCC)    takes Tradjenta daily     Past Surgical History:  Procedure Laterality Date   COLONOSCOPY WITH PROPOFOL N/A 02/03/2018   Procedure: COLONOSCOPY WITH PROPOFOL;  Surgeon: Christena Deem, MD;  Location: [facility] ENDOSCOPY;  Service: Endoscopy;  Laterality: N/A;   CORONARY ARTERY BYPASS GRAFT  05/12/2007   "CABG X1; at [facility]"   coronary artery bypass with arterial grafts     DILATION AND CURETTAGE OF UTERUS  2005   FRACTURE SURGERY     I & D EXTREMITY Right 04/30/2014   Procedure: IRRIGATION AND DEBRIDEMENT EXTREMITY;  Surgeon: Toni Arthurs, MD;  Location: [facility] OR;  Service: Orthopedics;  Laterality: Right;   INCISION AND DRAINAGE Right 08/11/2014   Procedure: REMOVAL OF DEEP IMPLANTS OF RIGHT TIBIAL FIBIAL INCISION AND DRAINAGE OF RIGHT ANKLE WOUND;  Surgeon: Toni Arthurs, MD;  Location: [facility] OR;  Service: Orthopedics;  Laterality: Right;   ORIF ANKLE FRACTURE Right 04/30/2014   Procedure: OPEN  REDUCTION INTERNAL FIXATION (ORIF) ANKLE FRACTURE;  Surgeon: Toni Arthurs, MD;  Location: [facility] OR;  Service: Orthopedics;  Laterality: Right;   REMOVAL OF IMPLANT Right 08/11/2014   Procedure: REMOVAL OF DEEP IMPLANTS OF RIGHT TIBIAL FIBIAL INCISION AND DRAINAGE OF RIGHT ANKLE WOUND;  Surgeon: Toni Arthurs, MD;  Location: [facility] OR;  Service: Orthopedics;  Laterality: Right;   SQUAMOUS CELL CARCINOMA EXCISION Right 2012   "nose"   VAGINAL HYSTERECTOMY  2007     Current Outpatient Medications  Medication Sig Dispense Refill   amLODipine (NORVASC) 10 MG tablet Take 1 tablet (10 mg total) by mouth daily. 90 tablet 0   aspirin EC 325 MG tablet Take 325 mg by mouth daily.     Calcium Carb-Cholecalciferol (CALTRATE 600+D3 SOFT) 600-800 MG-UNIT CHEW Chew by mouth.     carvedilol (COREG) 6.25 MG tablet TAKE 1 TABLET BY MOUTH TWICE DAILY 180 tablet 0   cetirizine (ZYRTEC) 10 MG tablet Take 10 mg by mouth daily.      chlorthalidone (HYGROTON) 25 MG tablet TAKE 1 TABLET BY MOUTH EVERY MORNING 90 tablet 1   Cholecalciferol (VITAMIN D3) 5000 units TABS Take 1 tablet by mouth daily.     diclofenac sodium (VOLTAREN) 1 % GEL Apply 2 g topically as needed.  ferrous sulfate 325 (65 FE) MG tablet Take 1 tablet by mouth daily.     fluticasone (FLONASE) 50 MCG/ACT nasal spray Place 1 spray into both nostrils daily.     furosemide (LASIX) 20 MG tablet Take 1 tablet (20 mg total) by mouth daily as needed. 90 tablet 0   gabapentin (NEURONTIN) 100 MG capsule Take 1 capsule (100 mg total) by mouth 2 (two) times daily. 180 capsule 0   hydrALAZINE (APRESOLINE) 50 MG tablet Take 1 tablet (50 mg total) by mouth 3 (three) times daily. 270 tablet 0   lisinopril (ZESTRIL) 10 MG tablet Take 1 tablet (10 mg total) by mouth daily. 90 tablet 0   pioglitazone (ACTOS) 15 MG tablet Take 1 tablet (15 mg total) by mouth every morning. 90 tablet 0   rosuvastatin (CRESTOR) 40 MG tablet TAKE 1 TABLET BY MOUTH EVERY DAY 90 tablet 1    sertraline (ZOLOFT) 50 MG tablet Take 1 tablet (50 mg total) by mouth every morning. 90 tablet 0   No current facility-administered medications for this visit.    Allergies:   Codeine    Social History:   reports that she has never smoked. She has never used smokeless tobacco. She reports that she does not drink alcohol and does not use drugs.   Family History:  family history is not on file.    ROS:     Review of Systems  Constitutional: Negative.   HENT: Negative.    Eyes: Negative.   Respiratory: Negative.    Gastrointestinal: Negative.   Genitourinary: Negative.   Musculoskeletal: Negative.   Skin: Negative.   Neurological: Negative.   Endo/Heme/Allergies: Negative.   Psychiatric/Behavioral: Negative.    All other systems reviewed and are negative.     All other systems are reviewed and negative.    PHYSICAL EXAM: VS:  BP 110/60   Pulse (!) 54   Ht 5\' 4"  (1.626 m)   Wt 184 lb (83.5 kg)   SpO2 95%   BMI 31.58 kg/m  , BMI Body mass index is 31.58 kg/m. Last weight:  Wt Readings from Last 3 Encounters:  10/01/22 184 lb (83.5 kg)  09/02/22 185 lb (83.9 kg)  06/27/22 189 lb (85.7 kg)     Physical Exam Constitutional:      Appearance: Normal appearance.  Cardiovascular:     Rate and Rhythm: Normal rate and regular rhythm.     Heart sounds: Normal heart sounds.  Pulmonary:     Effort: Pulmonary effort is normal.     Breath sounds: Normal breath sounds.  Musculoskeletal:     Right lower leg: No edema.     Left lower leg: No edema.  Neurological:     Mental Status: She is alert.       EKG:   Recent Labs: 05/31/2022: Platelets 174 08/29/2022: ALT 12; BUN 38; Creatinine, Ser 1.79; Hemoglobin 10.9; Potassium 4.1; Sodium 141; TSH 2.760    Lipid Panel    Component Value Date/Time   CHOL 111 08/29/2022 1310   TRIG 61 08/29/2022 1310   HDL 54 08/29/2022 1310   CHOLHDL 2.1 08/29/2022 1310   LDLCALC 44 08/29/2022 1310      Other studies  Reviewed: Additional studies/ records that were reviewed today include:  Review of the above records demonstrates:       No data to display            ASSESSMENT AND PLAN:    ICD-10-CM   1. Coronary artery  disease involving native coronary artery of native heart without angina pectoris  I25.10 MYOCARDIAL PERFUSION IMAGING    PCV ECHOCARDIOGRAM COMPLETE    2. Essential hypertension  I10 MYOCARDIAL PERFUSION IMAGING    PCV ECHOCARDIOGRAM COMPLETE   Hypertension is stable.    3. Type 2 diabetes mellitus without complication, without long-term current use of insulin (HCC)  E11.9 MYOCARDIAL PERFUSION IMAGING    PCV ECHOCARDIOGRAM COMPLETE    4. Mixed hyperlipidemia  E78.2 MYOCARDIAL PERFUSION IMAGING    PCV ECHOCARDIOGRAM COMPLETE    5. Syncope, unspecified syncope type  R55 MYOCARDIAL PERFUSION IMAGING    PCV ECHOCARDIOGRAM COMPLETE       Problem List Items Addressed This Visit       Cardiovascular and Mediastinum   Essential hypertension (Chronic)   Relevant Orders   MYOCARDIAL PERFUSION IMAGING   PCV ECHOCARDIOGRAM COMPLETE   Coronary artery disease involving native coronary artery of native heart without angina pectoris - Primary    Patient have not had stress test or echocardiogram since 2021 with history of CABG.  Plan on doing echocardiogram and stress test.      Relevant Orders   MYOCARDIAL PERFUSION IMAGING   PCV ECHOCARDIOGRAM COMPLETE     Endocrine   DM2 (diabetes mellitus, type 2) (HCC) (Chronic)   Relevant Orders   MYOCARDIAL PERFUSION IMAGING   PCV ECHOCARDIOGRAM COMPLETE     Other   Hyperlipidemia (Chronic)   Relevant Orders   MYOCARDIAL PERFUSION IMAGING   PCV ECHOCARDIOGRAM COMPLETE   Syncope   Relevant Orders   MYOCARDIAL PERFUSION IMAGING   PCV ECHOCARDIOGRAM COMPLETE       Disposition:   Return for echo, and stress test and f/u.    Total time spent: 30 minutes  Signed,  Adrian Blackwater, MD  10/01/2022 2:24 PM    [facility]

## 2022-10-01 NOTE — Assessment & Plan Note (Signed)
Patient have not had stress test or echocardiogram since 2021 with history of CABG.  Plan on doing echocardiogram and stress test.

## 2022-10-09 ENCOUNTER — Other Ambulatory Visit: Payer: Medicare HMO

## 2022-10-11 ENCOUNTER — Other Ambulatory Visit: Payer: Medicare HMO

## 2022-10-15 ENCOUNTER — Ambulatory Visit: Payer: Medicare HMO | Admitting: Cardiovascular Disease

## 2022-11-29 ENCOUNTER — Other Ambulatory Visit: Payer: Self-pay

## 2022-11-29 ENCOUNTER — Other Ambulatory Visit: Payer: Medicare HMO

## 2022-11-29 DIAGNOSIS — I10 Essential (primary) hypertension: Secondary | ICD-10-CM | POA: Diagnosis not present

## 2022-11-29 DIAGNOSIS — E119 Type 2 diabetes mellitus without complications: Secondary | ICD-10-CM | POA: Diagnosis not present

## 2022-11-30 LAB — LIPID PANEL
Chol/HDL Ratio: 2.4 ratio (ref 0.0–4.4)
Cholesterol, Total: 111 mg/dL (ref 100–199)
HDL: 47 mg/dL (ref >39)
LDL Chol Calc (NIH): 47 mg/dL (ref 0–99)
Triglycerides: 90 mg/dL (ref 0–149)
VLDL Cholesterol Cal: 17 mg/dL (ref 5–40)

## 2022-11-30 LAB — COMPREHENSIVE METABOLIC PANEL
ALT: 9 IU/L (ref 0–32)
AST: 19 IU/L (ref 0–40)
Albumin: 4.2 g/dL (ref 3.7–4.7)
Alkaline Phosphatase: 66 IU/L (ref 44–121)
BUN/Creatinine Ratio: 19 (ref 12–28)
BUN: 37 mg/dL — ABNORMAL HIGH (ref 8–27)
Bilirubin Total: 0.5 mg/dL (ref 0.0–1.2)
CO2: 21 mmol/L (ref 20–29)
Calcium: 10.1 mg/dL (ref 8.7–10.3)
Chloride: 102 mmol/L (ref 96–106)
Creatinine, Ser: 2 mg/dL — ABNORMAL HIGH (ref 0.57–1.00)
Globulin, Total: 2.2 g/dL (ref 1.5–4.5)
Glucose: 94 mg/dL (ref 70–99)
Potassium: 4 mmol/L (ref 3.5–5.2)
Sodium: 140 mmol/L (ref 134–144)
Total Protein: 6.4 g/dL (ref 6.0–8.5)
eGFR: 24 mL/min/{1.73_m2} — ABNORMAL LOW (ref >59)

## 2022-11-30 LAB — HEMOGLOBIN A1C
Est. average glucose Bld gHb Est-mCnc: 111 mg/dL
Hgb A1c MFr Bld: 5.5 % (ref 4.8–5.6)

## 2022-12-03 ENCOUNTER — Other Ambulatory Visit: Payer: Medicare HMO

## 2022-12-03 ENCOUNTER — Ambulatory Visit: Payer: Medicare HMO | Admitting: Internal Medicine

## 2022-12-09 ENCOUNTER — Ambulatory Visit (INDEPENDENT_AMBULATORY_CARE_PROVIDER_SITE_OTHER): Payer: Medicare HMO | Admitting: Internal Medicine

## 2022-12-09 VITALS — BP 110/50 | HR 59 | Ht 64.0 in | Wt 184.0 lb

## 2022-12-09 DIAGNOSIS — G5603 Carpal tunnel syndrome, bilateral upper limbs: Secondary | ICD-10-CM

## 2022-12-09 DIAGNOSIS — F330 Major depressive disorder, recurrent, mild: Secondary | ICD-10-CM | POA: Insufficient documentation

## 2022-12-09 DIAGNOSIS — E119 Type 2 diabetes mellitus without complications: Secondary | ICD-10-CM | POA: Diagnosis not present

## 2022-12-09 DIAGNOSIS — I10 Essential (primary) hypertension: Secondary | ICD-10-CM

## 2022-12-09 LAB — POCT CBG (FASTING - GLUCOSE)-MANUAL ENTRY: Glucose Fasting, POC: 129 mg/dL — AB (ref 70–99)

## 2022-12-09 MED ORDER — LISINOPRIL 10 MG PO TABS
10.0000 mg | ORAL_TABLET | Freq: Every day | ORAL | 0 refills | Status: DC
Start: 2022-12-09 — End: 2023-04-11

## 2022-12-09 MED ORDER — GABAPENTIN 300 MG PO CAPS
300.0000 mg | ORAL_CAPSULE | Freq: Three times a day (TID) | ORAL | 2 refills | Status: DC
Start: 2022-12-09 — End: 2023-04-11

## 2022-12-09 MED ORDER — PIOGLITAZONE HCL 15 MG PO TABS
15.0000 mg | ORAL_TABLET | Freq: Every morning | ORAL | 0 refills | Status: DC
Start: 2022-12-09 — End: 2023-02-17

## 2022-12-09 MED ORDER — SERTRALINE HCL 50 MG PO TABS
50.0000 mg | ORAL_TABLET | Freq: Every morning | ORAL | 0 refills | Status: DC
Start: 2022-12-09 — End: 2023-04-25

## 2022-12-09 NOTE — Progress Notes (Signed)
Established Patient Office Visit  Subjective:  Patient ID: [name], female    DOB: 1941/01/16  Age: 82 y.o. MRN: [id number]  Chief Complaint  Patient presents with   Follow-up    3 MO F/U    No new complaints, here for lab review and medication refills. Labs reviewed and notable for well controlled diabetes, A1c at target, lipids at target with cmp notable for stable CKD4. Denies any hypoglycemic episodes and home bg readings have been at target.     No other concerns at this time.   Past Medical History:  Diagnosis Date   Arthritis    "hands, feet, knees, shoulders" (05/05/2014)   Bipolar disorder (HCC)    Cataracts, bilateral    immature   Chronic kidney disease    "dr says they aren't functioning like they should" (05/05/2014)   Coronary artery disease    Depression    takes Zoloft daily   History of bronchitis 47yrs ago   Hyperlipidemia    takes Atorvastatin daily   Hypertension    takes Metoprolol,Lisinopril,and Apresoline daily   Joint pain    Peripheral edema    takes Chlothalidone daily   Squamous cell cancer of skin of nose 2012   "right"   Squamous cell cancer of skin of nose    Type II diabetes mellitus (HCC)    takes Tradjenta daily    Past Surgical History:  Procedure Laterality Date   COLONOSCOPY WITH PROPOFOL N/A 02/03/2018   Procedure: COLONOSCOPY WITH PROPOFOL;  Surgeon: Christena Deem, MD;  Location: [facility] ENDOSCOPY;  Service: Endoscopy;  Laterality: N/A;   CORONARY ARTERY BYPASS GRAFT  05/12/2007   "CABG X1; at [facility]"   coronary artery bypass with arterial grafts     DILATION AND CURETTAGE OF UTERUS  2005   FRACTURE SURGERY     I & D EXTREMITY Right 04/30/2014   Procedure: IRRIGATION AND DEBRIDEMENT EXTREMITY;  Surgeon: Toni Arthurs, MD;  Location: [facility] OR;  Service: Orthopedics;  Laterality: Right;   INCISION AND DRAINAGE Right 08/11/2014   Procedure: REMOVAL OF DEEP IMPLANTS OF RIGHT TIBIAL FIBIAL INCISION AND DRAINAGE OF RIGHT ANKLE  WOUND;  Surgeon: Toni Arthurs, MD;  Location: [facility] OR;  Service: Orthopedics;  Laterality: Right;   ORIF ANKLE FRACTURE Right 04/30/2014   Procedure: OPEN REDUCTION INTERNAL FIXATION (ORIF) ANKLE FRACTURE;  Surgeon: Toni Arthurs, MD;  Location: [facility] OR;  Service: Orthopedics;  Laterality: Right;   REMOVAL OF IMPLANT Right 08/11/2014   Procedure: REMOVAL OF DEEP IMPLANTS OF RIGHT TIBIAL FIBIAL INCISION AND DRAINAGE OF RIGHT ANKLE WOUND;  Surgeon: Toni Arthurs, MD;  Location: [facility] OR;  Service: Orthopedics;  Laterality: Right;   SQUAMOUS CELL CARCINOMA EXCISION Right 2012   "nose"   VAGINAL HYSTERECTOMY  2007    Social History   Socioeconomic History   Marital status: Widowed    Spouse name: Not on file   Number of children: Not on file   Years of education: Not on file   Highest education level: Not on file  Occupational History   Not on file  Tobacco Use   Smoking status: Never   Smokeless tobacco: Never  Vaping Use   Vaping status: Never Used  Substance and Sexual Activity   Alcohol use: No    Alcohol/week: 0.0 standard drinks of alcohol   Drug use: No   Sexual activity: Not on file  Other Topics Concern   Not on file  Social History Narrative   Not on  file   Social Determinants of Health   Financial Resource Strain: Not on file  Food Insecurity: Not on file  Transportation Needs: Not on file  Physical Activity: Not on file  Stress: Not on file  Social Connections: Not on file  Intimate Partner Violence: Not on file    No family history on file.  Allergies  Allergen Reactions   Codeine Nausea And Vomiting    "Makes me deathly sick"    ROS     Objective:   BP (!) 110/50   Pulse (!) 59   Ht 5\' 4"  (1.626 m)   Wt 184 lb (83.5 kg)   SpO2 97%   BMI 31.58 kg/m   Vitals:   12/09/22 1146 12/09/22 1153  BP:  (!) 110/50  Pulse:  (!) 59  Height: 5\' 4"  (1.626 m) 5\' 4"  (1.626 m)  Weight: 180 lb 6.4 oz (81.8 kg) 184 lb (83.5 kg)  SpO2:  97%  BMI (Calculated): 30.95  31.57    Physical Exam Vitals reviewed.  Constitutional:      General: She is not in acute distress.    Appearance: She is obese.  HENT:     Head: Normocephalic.     Nose: Nose normal.     Mouth/Throat:     Mouth: Mucous membranes are moist.  Eyes:     Extraocular Movements: Extraocular movements intact.     Pupils: Pupils are equal, round, and reactive to light.  Cardiovascular:     Rate and Rhythm: Normal rate and regular rhythm.     Heart sounds: No murmur heard. Pulmonary:     Effort: Pulmonary effort is normal.     Breath sounds: No rhonchi or rales.  Abdominal:     General: Abdomen is flat.     Palpations: There is no hepatomegaly, splenomegaly or mass.  Musculoskeletal:        General: Normal range of motion.     Cervical back: Normal range of motion. No tenderness.  Skin:    General: Skin is warm and dry.  Neurological:     General: No focal deficit present.     Mental Status: She is alert and oriented to person, place, and time.     Cranial Nerves: No cranial nerve deficit.     Motor: No weakness.  Psychiatric:        Mood and Affect: Mood normal.        Behavior: Behavior normal.      Results for orders placed or performed in visit on 12/09/22  POCT CBG (Fasting - Glucose)  Result Value Ref Range   Glucose Fasting, POC 129 (A) 70 - 99 mg/dL    Recent Results (from the past 2160 hour(Adalyn Pennock))  Comprehensive metabolic panel     Status: Abnormal   Collection Time: 11/29/22 11:57 AM  Result Value Ref Range   Glucose 94 70 - 99 mg/dL   BUN 37 (H) 8 - 27 mg/dL   Creatinine, Ser 4.69 (H) 0.57 - 1.00 mg/dL   eGFR 24 (L) >62 XB/MWU/1.32   BUN/Creatinine Ratio 19 12 - 28   Sodium 140 134 - 144 mmol/L   Potassium 4.0 3.5 - 5.2 mmol/L   Chloride 102 96 - 106 mmol/L   CO2 21 20 - 29 mmol/L   Calcium 10.1 8.7 - 10.3 mg/dL   Total Protein 6.4 6.0 - 8.5 g/dL   Albumin 4.2 3.7 - 4.7 g/dL   Globulin, Total 2.2 1.5 - 4.5 g/dL  Bilirubin Total 0.5 0.0 - 1.2 mg/dL    Alkaline Phosphatase 66 44 - 121 IU/L   AST 19 0 - 40 IU/L   ALT 9 0 - 32 IU/L  Lipid panel     Status: None   Collection Time: 11/29/22 11:57 AM  Result Value Ref Range   Cholesterol, Total 111 100 - 199 mg/dL   Triglycerides 90 0 - 149 mg/dL   HDL 47 >63 mg/dL   VLDL Cholesterol Cal 17 5 - 40 mg/dL   LDL Chol Calc (NIH) 47 0 - 99 mg/dL   Chol/HDL Ratio 2.4 0.0 - 4.4 ratio    Comment:                                   T. Chol/HDL Ratio                                             Men  Women                               1/2 Avg.Risk  3.4    3.3                                   Avg.Risk  5.0    4.4                                2X Avg.Risk  9.6    7.1                                3X Avg.Risk 23.4   11.0   Hemoglobin A1c     Status: None   Collection Time: 11/29/22 11:57 AM  Result Value Ref Range   Hgb A1c MFr Bld 5.5 4.8 - 5.6 %    Comment:          Prediabetes: 5.7 - 6.4          Diabetes: >6.4          Glycemic control for adults with diabetes: <7.0    Est. average glucose Bld gHb Est-mCnc 111 mg/dL  POCT CBG (Fasting - Glucose)     Status: Abnormal   Collection Time: 12/09/22 11:50 AM  Result Value Ref Range   Glucose Fasting, POC 129 (A) 70 - 99 mg/dL      Assessment & Plan:  As per problem list  Problem List Items Addressed This Visit       Cardiovascular and Mediastinum   Essential hypertension - Primary (Chronic)   Relevant Medications   lisinopril (ZESTRIL) 10 MG tablet     Endocrine   DM2 (diabetes mellitus, type 2) (HCC) (Chronic)   Relevant Medications   pioglitazone (ACTOS) 15 MG tablet   lisinopril (ZESTRIL) 10 MG tablet   Other Relevant Orders   POCT CBG (Fasting - Glucose) (Completed)   Hemoglobin A1c   Lipid panel     Nervous and Auditory   Bilateral carpal tunnel syndrome   Relevant Medications   gabapentin (NEURONTIN) 300 MG capsule   sertraline (  ZOLOFT) 50 MG tablet     Other   Mild episode of recurrent major depressive disorder  (HCC)   Relevant Medications   sertraline (ZOLOFT) 50 MG tablet    Return in about 3 months (around 03/11/2023) for fu with labs prior.   Total time spent: 20 minutes  Luna Fuse, MD  12/09/2022   This document may have been prepared by [facility] Voice Recognition software and as such may include unintentional dictation errors.

## 2022-12-17 DIAGNOSIS — N184 Chronic kidney disease, stage 4 (severe): Secondary | ICD-10-CM | POA: Diagnosis not present

## 2022-12-17 DIAGNOSIS — D631 Anemia in chronic kidney disease: Secondary | ICD-10-CM | POA: Diagnosis not present

## 2022-12-17 DIAGNOSIS — E1122 Type 2 diabetes mellitus with diabetic chronic kidney disease: Secondary | ICD-10-CM | POA: Diagnosis not present

## 2022-12-17 DIAGNOSIS — I10 Essential (primary) hypertension: Secondary | ICD-10-CM | POA: Diagnosis not present

## 2022-12-17 DIAGNOSIS — N2581 Secondary hyperparathyroidism of renal origin: Secondary | ICD-10-CM | POA: Diagnosis not present

## 2022-12-20 ENCOUNTER — Other Ambulatory Visit: Payer: Self-pay | Admitting: Internal Medicine

## 2023-02-14 ENCOUNTER — Other Ambulatory Visit: Payer: Self-pay | Admitting: Internal Medicine

## 2023-02-14 DIAGNOSIS — I10 Essential (primary) hypertension: Secondary | ICD-10-CM

## 2023-02-15 ENCOUNTER — Other Ambulatory Visit: Payer: Self-pay | Admitting: Internal Medicine

## 2023-02-15 DIAGNOSIS — E119 Type 2 diabetes mellitus without complications: Secondary | ICD-10-CM

## 2023-03-08 ENCOUNTER — Other Ambulatory Visit: Payer: Self-pay | Admitting: Internal Medicine

## 2023-03-08 DIAGNOSIS — S2241XS Multiple fractures of ribs, right side, sequela: Secondary | ICD-10-CM

## 2023-03-10 ENCOUNTER — Other Ambulatory Visit: Payer: Medicare HMO

## 2023-03-10 ENCOUNTER — Other Ambulatory Visit: Payer: Self-pay

## 2023-03-10 DIAGNOSIS — E119 Type 2 diabetes mellitus without complications: Secondary | ICD-10-CM

## 2023-03-10 DIAGNOSIS — I10 Essential (primary) hypertension: Secondary | ICD-10-CM | POA: Diagnosis not present

## 2023-03-11 ENCOUNTER — Ambulatory Visit: Payer: Medicare HMO | Admitting: Internal Medicine

## 2023-03-11 LAB — LIPID PANEL
Chol/HDL Ratio: 2.5 {ratio} (ref 0.0–4.4)
Cholesterol, Total: 111 mg/dL (ref 100–199)
HDL: 44 mg/dL (ref >39)
LDL Chol Calc (NIH): 46 mg/dL (ref 0–99)
Triglycerides: 118 mg/dL (ref 0–149)
VLDL Cholesterol Cal: 21 mg/dL (ref 5–40)

## 2023-03-11 LAB — COMPREHENSIVE METABOLIC PANEL
ALT: 12 [IU]/L (ref 0–32)
AST: 29 [IU]/L (ref 0–40)
Albumin: 4 g/dL (ref 3.7–4.7)
Alkaline Phosphatase: 82 [IU]/L (ref 44–121)
BUN/Creatinine Ratio: 16 (ref 12–28)
BUN: 30 mg/dL — ABNORMAL HIGH (ref 8–27)
Bilirubin Total: 0.5 mg/dL (ref 0.0–1.2)
CO2: 18 mmol/L — ABNORMAL LOW (ref 20–29)
Calcium: 10 mg/dL (ref 8.7–10.3)
Chloride: 104 mmol/L (ref 96–106)
Creatinine, Ser: 1.86 mg/dL — ABNORMAL HIGH (ref 0.57–1.00)
Globulin, Total: 2.4 g/dL (ref 1.5–4.5)
Glucose: 89 mg/dL (ref 70–99)
Potassium: 4.3 mmol/L (ref 3.5–5.2)
Sodium: 143 mmol/L (ref 134–144)
Total Protein: 6.4 g/dL (ref 6.0–8.5)
eGFR: 27 mL/min/{1.73_m2} — ABNORMAL LOW (ref >59)

## 2023-03-11 LAB — HEMOGLOBIN A1C
Est. average glucose Bld gHb Est-mCnc: 114 mg/dL
Hgb A1c MFr Bld: 5.6 % (ref 4.8–5.6)

## 2023-03-21 ENCOUNTER — Other Ambulatory Visit: Payer: Self-pay | Admitting: Internal Medicine

## 2023-03-26 ENCOUNTER — Other Ambulatory Visit: Payer: Self-pay

## 2023-03-26 MED ORDER — CARVEDILOL 6.25 MG PO TABS: 6.2500 mg | ORAL_TABLET | Freq: Two times a day (BID) | ORAL | 0 refills | Status: DC

## 2023-04-03 ENCOUNTER — Telehealth: Payer: Self-pay | Admitting: Internal Medicine

## 2023-04-03 NOTE — Telephone Encounter (Signed)
Pt daughter in law called in concerns of her mother in law starting to hallucinate. They wanted to let you know, not in front of the pt, because if so she will get mad.

## 2023-04-11 ENCOUNTER — Encounter: Payer: Self-pay | Admitting: Internal Medicine

## 2023-04-11 ENCOUNTER — Ambulatory Visit (INDEPENDENT_AMBULATORY_CARE_PROVIDER_SITE_OTHER): Payer: Medicare HMO | Admitting: Internal Medicine

## 2023-04-11 VITALS — BP 111/76 | HR 57 | Ht 64.0 in | Wt 154.4 lb

## 2023-04-11 DIAGNOSIS — I10 Essential (primary) hypertension: Secondary | ICD-10-CM | POA: Diagnosis not present

## 2023-04-11 DIAGNOSIS — G5603 Carpal tunnel syndrome, bilateral upper limbs: Secondary | ICD-10-CM | POA: Diagnosis not present

## 2023-04-11 DIAGNOSIS — R410 Disorientation, unspecified: Secondary | ICD-10-CM | POA: Diagnosis not present

## 2023-04-11 DIAGNOSIS — E119 Type 2 diabetes mellitus without complications: Secondary | ICD-10-CM

## 2023-04-11 DIAGNOSIS — Z23 Encounter for immunization: Secondary | ICD-10-CM | POA: Diagnosis not present

## 2023-04-11 LAB — POCT URINALYSIS DIPSTICK
Bilirubin, UA: NEGATIVE
Blood, UA: NEGATIVE
Glucose, UA: NEGATIVE
Ketones, UA: NEGATIVE
Nitrite, UA: NEGATIVE
Protein, UA: NEGATIVE
Spec Grav, UA: 1.015 (ref 1.010–1.025)
Urobilinogen, UA: NEGATIVE U/dL — AB
pH, UA: 6 (ref 5.0–8.0)

## 2023-04-11 LAB — POCT CBG (FASTING - GLUCOSE)-MANUAL ENTRY: Glucose Fasting, POC: 110 mg/dL — AB (ref 70–99)

## 2023-04-11 MED ORDER — GABAPENTIN 300 MG PO CAPS: 300.0000 mg | ORAL_CAPSULE | Freq: Every day | ORAL | 0 refills | Status: DC

## 2023-04-11 MED ORDER — HYDRALAZINE HCL 50 MG PO TABS: 50.0000 mg | ORAL_TABLET | Freq: Three times a day (TID) | ORAL | 0 refills | Status: DC

## 2023-04-11 MED ORDER — LISINOPRIL 10 MG PO TABS: 10.0000 mg | ORAL_TABLET | Freq: Every day | ORAL | 0 refills | Status: DC

## 2023-04-11 NOTE — Progress Notes (Signed)
Established Patient Office Visit  Subjective:  Patient ID: [name], female    DOB: May 20, 1940  Age: 82 y.o. MRN: [id number]  Chief Complaint  Patient presents with   Fall    Pt fell last Thursday and Monday, she hit her side in the same spot both falls, right lower side   Follow-up    Mood swings and worrying is one of the main concern for son and daughter in law    Accompanied by family who report increasing hallucinations over the last few months.    No other concerns at this time.   Past Medical History:  Diagnosis Date   Arthritis    "hands, feet, knees, shoulders" (05/05/2014)   Bipolar disorder (HCC)    Cataracts, bilateral    immature   Chronic kidney disease    "dr says they aren't functioning like they should" (05/05/2014)   Coronary artery disease    Depression    takes Zoloft daily   History of bronchitis 30yrs ago   Hyperlipidemia    takes Atorvastatin daily   Hypertension    takes Metoprolol,Lisinopril,and Apresoline daily   Joint pain    Peripheral edema    takes Chlothalidone daily   Squamous cell cancer of skin of nose 2012   "right"   Squamous cell cancer of skin of nose    Type II diabetes mellitus (HCC)    takes Tradjenta daily    Past Surgical History:  Procedure Laterality Date   COLONOSCOPY WITH PROPOFOL N/A 02/03/2018   Procedure: COLONOSCOPY WITH PROPOFOL;  Surgeon: Christena Deem, MD;  Location: [facility] ENDOSCOPY;  Service: Endoscopy;  Laterality: N/A;   CORONARY ARTERY BYPASS GRAFT  05/12/2007   "CABG X1; at [facility]"   coronary artery bypass with arterial grafts     DILATION AND CURETTAGE OF UTERUS  2005   FRACTURE SURGERY     I & D EXTREMITY Right 04/30/2014   Procedure: IRRIGATION AND DEBRIDEMENT EXTREMITY;  Surgeon: Toni Arthurs, MD;  Location: [facility] OR;  Service: Orthopedics;  Laterality: Right;   INCISION AND DRAINAGE Right 08/11/2014   Procedure: REMOVAL OF DEEP IMPLANTS OF RIGHT TIBIAL FIBIAL INCISION AND DRAINAGE OF RIGHT  ANKLE WOUND;  Surgeon: Toni Arthurs, MD;  Location: [facility] OR;  Service: Orthopedics;  Laterality: Right;   ORIF ANKLE FRACTURE Right 04/30/2014   Procedure: OPEN REDUCTION INTERNAL FIXATION (ORIF) ANKLE FRACTURE;  Surgeon: Toni Arthurs, MD;  Location: [facility] OR;  Service: Orthopedics;  Laterality: Right;   REMOVAL OF IMPLANT Right 08/11/2014   Procedure: REMOVAL OF DEEP IMPLANTS OF RIGHT TIBIAL FIBIAL INCISION AND DRAINAGE OF RIGHT ANKLE WOUND;  Surgeon: Toni Arthurs, MD;  Location: [facility] OR;  Service: Orthopedics;  Laterality: Right;   SQUAMOUS CELL CARCINOMA EXCISION Right 2012   "nose"   VAGINAL HYSTERECTOMY  2007    Social History   Socioeconomic History   Marital status: Widowed    Spouse name: Not on file   Number of children: Not on file   Years of education: Not on file   Highest education level: Not on file  Occupational History   Not on file  Tobacco Use   Smoking status: Never   Smokeless tobacco: Never  Vaping Use   Vaping status: Never Used  Substance and Sexual Activity   Alcohol use: No    Alcohol/week: 0.0 standard drinks of alcohol   Drug use: No   Sexual activity: Not on file  Other Topics Concern   Not on file  Social History Narrative   Not on file   Social Drivers of Health   Financial Resource Strain: Not on file  Food Insecurity: Not on file  Transportation Needs: Not on file  Physical Activity: Not on file  Stress: Not on file  Social Connections: Not on file  Intimate Partner Violence: Not on file    No family history on file.  Allergies  Allergen Reactions   Codeine Nausea And Vomiting    "Makes me deathly sick"    Outpatient Medications Prior to Visit  Medication Sig   amLODipine (NORVASC) 10 MG tablet TAKE 1 TABLET(10 MG) BY MOUTH DAILY   aspirin EC 325 MG tablet Take 325 mg by mouth daily.   Calcium Carb-Cholecalciferol (CALTRATE 600+D3 SOFT) 600-800 MG-UNIT CHEW Chew by mouth.   carvedilol (COREG) 6.25 MG tablet Take 1 tablet (6.25 mg total)  by mouth 2 (two) times daily.   cetirizine (ZYRTEC) 10 MG tablet Take 10 mg by mouth daily.    chlorthalidone (HYGROTON) 25 MG tablet TAKE 1 TABLET BY MOUTH EVERY MORNING   Cholecalciferol (VITAMIN D3) 5000 units TABS Take 1 tablet by mouth daily.   diclofenac sodium (VOLTAREN) 1 % GEL Apply 2 g topically as needed.    ferrous sulfate 325 (65 FE) MG tablet Take 1 tablet by mouth daily.   fluticasone (FLONASE) 50 MCG/ACT nasal spray Place 1 spray into both nostrils daily.   furosemide (LASIX) 20 MG tablet TAKE 1 TABLET(20 MG) BY MOUTH DAILY AS NEEDED   pioglitazone (ACTOS) 15 MG tablet TAKE 1 TABLET BY MOUTH EVERY MORNING   rosuvastatin (CRESTOR) 40 MG tablet TAKE 1 TABLET BY MOUTH EVERY DAY   sertraline (ZOLOFT) 50 MG tablet Take 1 tablet (50 mg total) by mouth every morning.   [DISCONTINUED] gabapentin (NEURONTIN) 300 MG capsule Take 1 capsule (300 mg total) by mouth 3 (three) times daily.   [DISCONTINUED] hydrALAZINE (APRESOLINE) 50 MG tablet Take 1 tablet (50 mg total) by mouth 3 (three) times daily.   [DISCONTINUED] lisinopril (ZESTRIL) 10 MG tablet Take 1 tablet (10 mg total) by mouth daily.   No facility-administered medications prior to visit.    ROS     Objective:   BP 111/76   Pulse (!) 57   Ht 5\' 4"  (1.626 m)   Wt 154 lb 6.4 oz (70 kg)   SpO2 98%   BMI 26.50 kg/m   Vitals:   04/11/23 1108  BP: 111/76  Pulse: (!) 57  Height: 5\' 4"  (1.626 m)  Weight: 154 lb 6.4 oz (70 kg)  SpO2: 98%  BMI (Calculated): 26.49    Physical Exam   Results for orders placed or performed in visit on 04/11/23  POCT CBG (Fasting - Glucose)  Result Value Ref Range   Glucose Fasting, POC 110 (A) 70 - 99 mg/dL  POCT Urinalysis Dipstick (81002)  Result Value Ref Range   Color, UA Yellow    Clarity, UA Clear    Glucose, UA Negative Negative   Bilirubin, UA Negative    Ketones, UA Negative    Spec Grav, UA 1.015 1.010 - 1.025   Blood, UA Negative    pH, UA 6.0 5.0 - 8.0   Protein,  UA Negative Negative   Urobilinogen, UA negative (A) 0.2 or 1.0 E.U./dL   Nitrite, UA Negative    Leukocytes, UA Moderate (2+) (A) Negative   Appearance Clear    Odor Yes     Recent Results (from the past 2160 hours)  Lipid panel     Status: None   Collection Time: 03/10/23  3:20 PM  Result Value Ref Range   Cholesterol, Total 111 100 - 199 mg/dL   Triglycerides 696 0 - 149 mg/dL   HDL 44 >29 mg/dL   VLDL Cholesterol Cal 21 5 - 40 mg/dL   LDL Chol Calc (NIH) 46 0 - 99 mg/dL   Chol/HDL Ratio 2.5 0.0 - 4.4 ratio    Comment:                                   T. Chol/HDL Ratio                                             Men  Women                               1/2 Avg.Risk  3.4    3.3                                   Avg.Risk  5.0    4.4                                2X Avg.Risk  9.6    7.1                                3X Avg.Risk 23.4   11.0   Hemoglobin A1c     Status: None   Collection Time: 03/10/23  3:20 PM  Result Value Ref Range   Hgb A1c MFr Bld 5.6 4.8 - 5.6 %    Comment:          Prediabetes: 5.7 - 6.4          Diabetes: >6.4          Glycemic control for adults with diabetes: <7.0    Est. average glucose Bld gHb Est-mCnc 114 mg/dL  Comprehensive metabolic panel     Status: Abnormal   Collection Time: 03/10/23  3:22 PM  Result Value Ref Range   Glucose 89 70 - 99 mg/dL   BUN 30 (H) 8 - 27 mg/dL   Creatinine, Ser 5.28 (H) 0.57 - 1.00 mg/dL   eGFR 27 (L) >41 LK/GMW/1.02   BUN/Creatinine Ratio 16 12 - 28   Sodium 143 134 - 144 mmol/L   Potassium 4.3 3.5 - 5.2 mmol/L   Chloride 104 96 - 106 mmol/L   CO2 18 (L) 20 - 29 mmol/L   Calcium 10.0 8.7 - 10.3 mg/dL   Total Protein 6.4 6.0 - 8.5 g/dL   Albumin 4.0 3.7 - 4.7 g/dL   Globulin, Total 2.4 1.5 - 4.5 g/dL   Bilirubin Total 0.5 0.0 - 1.2 mg/dL   Alkaline Phosphatase 82 44 - 121 IU/L   AST 29 0 - 40 IU/L   ALT 12 0 - 32 IU/L  POCT CBG (Fasting - Glucose)     Status: Abnormal   Collection Time: 04/11/23 11:20  AM  Result Value Ref Range   Glucose  Fasting, POC 110 (A) 70 - 99 mg/dL  POCT Urinalysis Dipstick (87564)     Status: Abnormal   Collection Time: 04/11/23 11:47 AM  Result Value Ref Range   Color, UA Yellow    Clarity, UA Clear    Glucose, UA Negative Negative   Bilirubin, UA Negative    Ketones, UA Negative    Spec Grav, UA 1.015 1.010 - 1.025   Blood, UA Negative    pH, UA 6.0 5.0 - 8.0   Protein, UA Negative Negative   Urobilinogen, UA negative (A) 0.2 or 1.0 E.U./dL   Nitrite, UA Negative    Leukocytes, UA Moderate (2+) (A) Negative   Appearance Clear    Odor Yes       Assessment & Plan:  As per problem list. Reduce Gabapentin.  Problem List Items Addressed This Visit       Cardiovascular and Mediastinum   Essential hypertension (Chronic)   Relevant Medications   lisinopril (ZESTRIL) 10 MG tablet   hydrALAZINE (APRESOLINE) 50 MG tablet   Other Relevant Orders   Flu Vaccine Trivalent High Dose (Fluad) (Completed)     Endocrine   DM2 (diabetes mellitus, type 2) (HCC) - Primary (Chronic)   Relevant Medications   lisinopril (ZESTRIL) 10 MG tablet   Other Relevant Orders   POCT CBG (Fasting - Glucose) (Completed)   Flu Vaccine Trivalent High Dose (Fluad) (Completed)     Nervous and Auditory   Bilateral carpal tunnel syndrome   Relevant Medications   gabapentin (NEURONTIN) 300 MG capsule   Other Visit Diagnoses       Confusion       Relevant Orders   Comprehensive metabolic panel   POCT Urinalysis Dipstick (33295) (Completed)       Return in about 1 month (around 05/12/2023) for confusion follow up.   Total time spent: 30 minutes  Luna Fuse, MD  04/11/2023   This document may have been prepared by [facility] Voice Recognition software and as such may include unintentional dictation errors.

## 2023-04-17 ENCOUNTER — Encounter: Payer: Self-pay | Admitting: Internal Medicine

## 2023-04-23 DIAGNOSIS — I10 Essential (primary) hypertension: Secondary | ICD-10-CM | POA: Diagnosis not present

## 2023-04-23 DIAGNOSIS — E1122 Type 2 diabetes mellitus with diabetic chronic kidney disease: Secondary | ICD-10-CM | POA: Diagnosis not present

## 2023-04-23 DIAGNOSIS — N2581 Secondary hyperparathyroidism of renal origin: Secondary | ICD-10-CM | POA: Diagnosis not present

## 2023-04-23 DIAGNOSIS — D631 Anemia in chronic kidney disease: Secondary | ICD-10-CM | POA: Diagnosis not present

## 2023-04-23 DIAGNOSIS — N184 Chronic kidney disease, stage 4 (severe): Secondary | ICD-10-CM | POA: Diagnosis not present

## 2023-04-24 ENCOUNTER — Other Ambulatory Visit: Payer: Self-pay | Admitting: Internal Medicine

## 2023-04-24 DIAGNOSIS — F330 Major depressive disorder, recurrent, mild: Secondary | ICD-10-CM

## 2023-05-12 ENCOUNTER — Ambulatory Visit: Payer: Medicare HMO | Admitting: Internal Medicine

## 2023-05-20 ENCOUNTER — Ambulatory Visit (INDEPENDENT_AMBULATORY_CARE_PROVIDER_SITE_OTHER): Payer: Self-pay | Admitting: Internal Medicine

## 2023-05-20 ENCOUNTER — Ambulatory Visit: Payer: Self-pay | Admitting: Internal Medicine

## 2023-05-20 ENCOUNTER — Other Ambulatory Visit: Payer: Self-pay | Admitting: Internal Medicine

## 2023-05-20 DIAGNOSIS — F03B18 Unspecified dementia, moderate, with other behavioral disturbance: Secondary | ICD-10-CM

## 2023-05-20 NOTE — Progress Notes (Signed)
 Established Patient Office Visit  Subjective:  Patient ID: [name], female    DOB: September 26, 1940  Age: 83 y.o. MRN: [id number]  No chief complaint on file.   Discussed with son who states pt refused to come to the appt and has become increasingly paranoid with violent behaviour. Care options were discussed including psych eval, hospital admission to facilitate skilled nursing facility placement.       No other concerns at this time.   Past Medical History:  Diagnosis Date   Arthritis    hands, feet, knees, shoulders (05/05/2014)   Bipolar disorder (HCC)    Cataracts, bilateral    immature   Chronic kidney disease    dr says they aren't functioning like they should (05/05/2014)   Coronary artery disease    Depression    takes Zoloft  daily   History of bronchitis 31yrs ago   Hyperlipidemia    takes Atorvastatin  daily   Hypertension    takes Metoprolol ,Lisinopril ,and Apresoline  daily   Joint pain    Peripheral edema    takes Chlothalidone daily   Squamous cell cancer of skin of nose 2012   right   Squamous cell cancer of skin of nose    Type II diabetes mellitus (HCC)    takes Tradjenta  daily    Past Surgical History:  Procedure Laterality Date   COLONOSCOPY WITH PROPOFOL  N/A 02/03/2018   Procedure: COLONOSCOPY WITH PROPOFOL ;  Surgeon: Gaylyn Gladis PENNER, MD;  Location: [facility] ENDOSCOPY;  Service: Endoscopy;  Laterality: N/A;   CORONARY ARTERY BYPASS GRAFT  05/12/2007   CABG X1; at [facility]   coronary artery bypass with arterial grafts     DILATION AND CURETTAGE OF UTERUS  2005   FRACTURE SURGERY     I & D EXTREMITY Right 04/30/2014   Procedure: IRRIGATION AND DEBRIDEMENT EXTREMITY;  Surgeon: Norleen Armor, MD;  Location: [facility] OR;  Service: Orthopedics;  Laterality: Right;   INCISION AND DRAINAGE Right 08/11/2014   Procedure: REMOVAL OF DEEP IMPLANTS OF RIGHT TIBIAL FIBIAL INCISION AND DRAINAGE OF RIGHT ANKLE WOUND;  Surgeon: Norleen Armor, MD;  Location: [facility] OR;   Service: Orthopedics;  Laterality: Right;   ORIF ANKLE FRACTURE Right 04/30/2014   Procedure: OPEN REDUCTION INTERNAL FIXATION (ORIF) ANKLE FRACTURE;  Surgeon: Norleen Armor, MD;  Location: [facility] OR;  Service: Orthopedics;  Laterality: Right;   REMOVAL OF IMPLANT Right 08/11/2014   Procedure: REMOVAL OF DEEP IMPLANTS OF RIGHT TIBIAL FIBIAL INCISION AND DRAINAGE OF RIGHT ANKLE WOUND;  Surgeon: Norleen Armor, MD;  Location: [facility] OR;  Service: Orthopedics;  Laterality: Right;   SQUAMOUS CELL CARCINOMA EXCISION Right 2012   nose   VAGINAL HYSTERECTOMY  2007    Social History   Socioeconomic History   Marital status: Widowed    Spouse name: Not on file   Number of children: Not on file   Years of education: Not on file   Highest education level: Not on file  Occupational History   Not on file  Tobacco Use   Smoking status: Never   Smokeless tobacco: Never  Vaping Use   Vaping status: Never Used  Substance and Sexual Activity   Alcohol use: No    Alcohol/week: 0.0 standard drinks of alcohol   Drug use: No   Sexual activity: Not on file  Other Topics Concern   Not on file  Social History Narrative   Not on file   Social Drivers of Health   Financial Resource Strain: Not on file  Food Insecurity: Not on file  Transportation Needs: Not on file  Physical Activity: Not on file  Stress: Not on file  Social Connections: Not on file  Intimate Partner Violence: Not on file    No family history on file.  Allergies  Allergen Reactions   Codeine Nausea And Vomiting    Makes me deathly sick    Outpatient Medications Prior to Visit  Medication Sig   amLODipine  (NORVASC ) 10 MG tablet TAKE 1 TABLET(10 MG) BY MOUTH DAILY   aspirin  EC 325 MG tablet Take 325 mg by mouth daily.   Calcium  Carb-Cholecalciferol  (CALTRATE 600+D3 SOFT) 600-800 MG-UNIT CHEW Chew by mouth.   carvedilol  (COREG ) 6.25 MG tablet Take 1 tablet (6.25 mg total) by mouth 2 (two) times daily.   cetirizine (ZYRTEC) 10 MG  tablet Take 10 mg by mouth daily.    chlorthalidone  (HYGROTON ) 25 MG tablet TAKE 1 TABLET BY MOUTH EVERY MORNING   Cholecalciferol  (VITAMIN D3) 5000 units TABS Take 1 tablet by mouth daily.   diclofenac sodium (VOLTAREN) 1 % GEL Apply 2 g topically as needed.    ferrous sulfate 325 (65 FE) MG tablet Take 1 tablet by mouth daily.   fluticasone (FLONASE) 50 MCG/ACT nasal spray Place 1 spray into both nostrils daily.   furosemide  (LASIX ) 20 MG tablet TAKE 1 TABLET(20 MG) BY MOUTH DAILY AS NEEDED   gabapentin  (NEURONTIN ) 300 MG capsule Take 1 capsule (300 mg total) by mouth daily in the afternoon.   hydrALAZINE  (APRESOLINE ) 50 MG tablet Take 1 tablet (50 mg total) by mouth 3 (three) times daily.   lisinopril  (ZESTRIL ) 10 MG tablet Take 1 tablet (10 mg total) by mouth daily.   pioglitazone  (ACTOS ) 15 MG tablet TAKE 1 TABLET BY MOUTH EVERY MORNING   rosuvastatin  (CRESTOR ) 40 MG tablet TAKE 1 TABLET BY MOUTH EVERY DAY   sertraline  (ZOLOFT ) 50 MG tablet TAKE 1 TABLET(50 MG) BY MOUTH EVERY MORNING   No facility-administered medications prior to visit.    ROS     Objective:   There were no vitals taken for this visit.  There were no vitals filed for this visit.  Physical Exam   No results found for any visits on 05/20/23.  Recent Results (from the past 2160 hours)  Lipid panel     Status: None   Collection Time: 03/10/23  3:20 PM  Result Value Ref Range   Cholesterol, Total 111 100 - 199 mg/dL   Triglycerides 881 0 - 149 mg/dL   HDL 44 >60 mg/dL   VLDL Cholesterol Cal 21 5 - 40 mg/dL   LDL Chol Calc (NIH) 46 0 - 99 mg/dL   Chol/HDL Ratio 2.5 0.0 - 4.4 ratio    Comment:                                   T. Chol/HDL Ratio                                             Men  Women                               1/2 Avg.Risk  3.4    3.3  Avg.Risk  5.0    4.4                                2X Avg.Risk  9.6    7.1                                3X Avg.Risk  23.4   11.0   Hemoglobin A1c     Status: None   Collection Time: 03/10/23  3:20 PM  Result Value Ref Range   Hgb A1c MFr Bld 5.6 4.8 - 5.6 %    Comment:          Prediabetes: 5.7 - 6.4          Diabetes: >6.4          Glycemic control for adults with diabetes: <7.0    Est. average glucose Bld gHb Est-mCnc 114 mg/dL  Comprehensive metabolic panel     Status: Abnormal   Collection Time: 03/10/23  3:22 PM  Result Value Ref Range   Glucose 89 70 - 99 mg/dL   BUN 30 (H) 8 - 27 mg/dL   Creatinine, Ser 8.13 (H) 0.57 - 1.00 mg/dL   eGFR 27 (L) >40 fO/fpw/8.26   BUN/Creatinine Ratio 16 12 - 28   Sodium 143 134 - 144 mmol/L   Potassium 4.3 3.5 - 5.2 mmol/L   Chloride 104 96 - 106 mmol/L   CO2 18 (L) 20 - 29 mmol/L   Calcium  10.0 8.7 - 10.3 mg/dL   Total Protein 6.4 6.0 - 8.5 g/dL   Albumin 4.0 3.7 - 4.7 g/dL   Globulin, Total 2.4 1.5 - 4.5 g/dL   Bilirubin Total 0.5 0.0 - 1.2 mg/dL   Alkaline Phosphatase 82 44 - 121 IU/L   AST 29 0 - 40 IU/L   ALT 12 0 - 32 IU/L  POCT CBG (Fasting - Glucose)     Status: Abnormal   Collection Time: 04/11/23 11:20 AM  Result Value Ref Range   Glucose Fasting, POC 110 (A) 70 - 99 mg/dL  POCT Urinalysis Dipstick (18997)     Status: Abnormal   Collection Time: 04/11/23 11:47 AM  Result Value Ref Range   Color, UA Yellow    Clarity, UA Clear    Glucose, UA Negative Negative   Bilirubin, UA Negative    Ketones, UA Negative    Spec Grav, UA 1.015 1.010 - 1.025   Blood, UA Negative    pH, UA 6.0 5.0 - 8.0   Protein, UA Negative Negative   Urobilinogen, UA negative (A) 0.2 or 1.0 E.U./dL   Nitrite, UA Negative    Leukocytes, UA Moderate (2+) (A) Negative   Appearance Clear    Odor Yes       Assessment & Plan:  Dementia with behaviors. Refer to psychiatry. Problem List Items Addressed This Visit   None   No follow-ups on file.   Total time spent: 10 minutes  Sherrill Cinderella Perry, MD  05/20/2023   This document may have been prepared  by [facility] Voice Recognition software and as such may include unintentional dictation errors.

## 2023-05-29 DIAGNOSIS — Z0100 Encounter for examination of eyes and vision without abnormal findings: Secondary | ICD-10-CM | POA: Diagnosis not present

## 2023-05-29 DIAGNOSIS — H2513 Age-related nuclear cataract, bilateral: Secondary | ICD-10-CM | POA: Diagnosis not present

## 2023-05-29 DIAGNOSIS — E119 Type 2 diabetes mellitus without complications: Secondary | ICD-10-CM | POA: Diagnosis not present

## 2023-05-29 DIAGNOSIS — H40003 Preglaucoma, unspecified, bilateral: Secondary | ICD-10-CM | POA: Diagnosis not present

## 2023-06-16 DIAGNOSIS — H2511 Age-related nuclear cataract, right eye: Secondary | ICD-10-CM | POA: Diagnosis not present

## 2023-06-26 ENCOUNTER — Encounter: Payer: Self-pay | Admitting: Ophthalmology

## 2023-06-26 NOTE — Anesthesia Preprocedure Evaluation (Signed)
Anesthesia Evaluation  Patient identified by MRN, date of birth, ID band Patient awake    Reviewed: Allergy & Precautions, NPO status , Patient's Chart, lab work & pertinent test results  Airway Mallampati: II  TM Distance: >3 FB Neck ROM: Full    Dental no notable dental hx. (+) Teeth Intact, Dental Advisory Given   Pulmonary sleep apnea    Pulmonary exam normal breath sounds clear to auscultation       Cardiovascular hypertension, + dysrhythmias Atrial Fibrillation + pacemaker  Rhythm:Regular Rate:Normal     Neuro/Psych negative neurological ROS  negative psych ROS   GI/Hepatic   Endo/Other  diabetes    Renal/GU      Musculoskeletal  (+) Arthritis ,    Abdominal   Peds  Hematology   Anesthesia Other Findings All: Metoprolol, amlodipine sulfa, clindamycin, lipitor  Reproductive/Obstetrics                             Anesthesia Physical Anesthesia Plan  ASA: 3  Anesthesia Plan: General   Post-op Pain Management: Precedex and Tylenol PO (pre-op)*   Induction: Intravenous  PONV Risk Score and Plan: 4 or greater and Treatment may vary due to age or medical condition, Ondansetron and Midazolam  Airway Management Planned: LMA and Oral ETT  Additional Equipment: None  Intra-op Plan:   Post-operative Plan: Extubation in OR  Informed Consent: I have reviewed the patients History and Physical, chart, labs and discussed the procedure including the risks, benefits and alternatives for the proposed anesthesia with the patient or authorized representative who has indicated his/her understanding and acceptance.     Dental advisory given and Interpreter used for interveiw  Plan Discussed with:   Anesthesia Plan Comments:        Anesthesia Quick Evaluation  

## 2023-06-28 ENCOUNTER — Other Ambulatory Visit: Payer: Self-pay | Admitting: Internal Medicine

## 2023-07-03 ENCOUNTER — Encounter: Payer: Self-pay | Admitting: Intensive Care

## 2023-07-03 ENCOUNTER — Emergency Department

## 2023-07-03 ENCOUNTER — Inpatient Hospital Stay

## 2023-07-03 ENCOUNTER — Inpatient Hospital Stay
Admission: EM | Admit: 2023-07-03 | Discharge: 2023-07-09 | DRG: 558 | Disposition: A | Discharge status: Home Health | Attending: Internal Medicine | Admitting: Internal Medicine

## 2023-07-03 ENCOUNTER — Other Ambulatory Visit: Payer: Self-pay

## 2023-07-03 DIAGNOSIS — W19XXXA Unspecified fall, initial encounter: Secondary | ICD-10-CM | POA: Diagnosis not present

## 2023-07-03 DIAGNOSIS — E785 Hyperlipidemia, unspecified: Secondary | ICD-10-CM | POA: Diagnosis present

## 2023-07-03 DIAGNOSIS — M5136 Other intervertebral disc degeneration, lumbar region with discogenic back pain only: Secondary | ICD-10-CM | POA: Diagnosis not present

## 2023-07-03 DIAGNOSIS — M542 Cervicalgia: Secondary | ICD-10-CM | POA: Diagnosis present

## 2023-07-03 DIAGNOSIS — I5A Non-ischemic myocardial injury (non-traumatic): Secondary | ICD-10-CM | POA: Diagnosis not present

## 2023-07-03 DIAGNOSIS — S8011XA Contusion of right lower leg, initial encounter: Secondary | ICD-10-CM | POA: Diagnosis present

## 2023-07-03 DIAGNOSIS — I2489 Other forms of acute ischemic heart disease: Secondary | ICD-10-CM | POA: Diagnosis present

## 2023-07-03 DIAGNOSIS — I5032 Chronic diastolic (congestive) heart failure: Secondary | ICD-10-CM | POA: Diagnosis present

## 2023-07-03 DIAGNOSIS — N1832 Chronic kidney disease, stage 3b: Secondary | ICD-10-CM | POA: Diagnosis present

## 2023-07-03 DIAGNOSIS — F319 Bipolar disorder, unspecified: Secondary | ICD-10-CM | POA: Diagnosis present

## 2023-07-03 DIAGNOSIS — Y92017 Garden or yard in single-family (private) house as the place of occurrence of the external cause: Secondary | ICD-10-CM | POA: Diagnosis not present

## 2023-07-03 DIAGNOSIS — I130 Hypertensive heart and chronic kidney disease with heart failure and stage 1 through stage 4 chronic kidney disease, or unspecified chronic kidney disease: Secondary | ICD-10-CM | POA: Diagnosis not present

## 2023-07-03 DIAGNOSIS — Z043 Encounter for examination and observation following other accident: Secondary | ICD-10-CM | POA: Diagnosis not present

## 2023-07-03 DIAGNOSIS — F419 Anxiety disorder, unspecified: Secondary | ICD-10-CM | POA: Diagnosis present

## 2023-07-03 DIAGNOSIS — R55 Syncope and collapse: Secondary | ICD-10-CM | POA: Diagnosis not present

## 2023-07-03 DIAGNOSIS — R9431 Abnormal electrocardiogram [ECG] [EKG]: Secondary | ICD-10-CM | POA: Diagnosis not present

## 2023-07-03 DIAGNOSIS — R079 Chest pain, unspecified: Secondary | ICD-10-CM | POA: Diagnosis not present

## 2023-07-03 DIAGNOSIS — R748 Abnormal levels of other serum enzymes: Secondary | ICD-10-CM | POA: Diagnosis not present

## 2023-07-03 DIAGNOSIS — R7989 Other specified abnormal findings of blood chemistry: Secondary | ICD-10-CM | POA: Diagnosis not present

## 2023-07-03 DIAGNOSIS — Z951 Presence of aortocoronary bypass graft: Secondary | ICD-10-CM | POA: Diagnosis not present

## 2023-07-03 DIAGNOSIS — Z7984 Long term (current) use of oral hypoglycemic drugs: Secondary | ICD-10-CM

## 2023-07-03 DIAGNOSIS — Z90710 Acquired absence of both cervix and uterus: Secondary | ICD-10-CM

## 2023-07-03 DIAGNOSIS — I441 Atrioventricular block, second degree: Secondary | ICD-10-CM | POA: Diagnosis present

## 2023-07-03 DIAGNOSIS — N179 Acute kidney failure, unspecified: Secondary | ICD-10-CM | POA: Diagnosis not present

## 2023-07-03 DIAGNOSIS — I10 Essential (primary) hypertension: Secondary | ICD-10-CM | POA: Diagnosis present

## 2023-07-03 DIAGNOSIS — M47812 Spondylosis without myelopathy or radiculopathy, cervical region: Secondary | ICD-10-CM | POA: Diagnosis not present

## 2023-07-03 DIAGNOSIS — Z79899 Other long term (current) drug therapy: Secondary | ICD-10-CM | POA: Diagnosis not present

## 2023-07-03 DIAGNOSIS — R000 Tachycardia, unspecified: Secondary | ICD-10-CM | POA: Diagnosis present

## 2023-07-03 DIAGNOSIS — M79661 Pain in right lower leg: Secondary | ICD-10-CM | POA: Diagnosis not present

## 2023-07-03 DIAGNOSIS — R519 Headache, unspecified: Secondary | ICD-10-CM | POA: Diagnosis not present

## 2023-07-03 DIAGNOSIS — I672 Cerebral atherosclerosis: Secondary | ICD-10-CM | POA: Diagnosis not present

## 2023-07-03 DIAGNOSIS — Y92009 Unspecified place in unspecified non-institutional (private) residence as the place of occurrence of the external cause: Secondary | ICD-10-CM

## 2023-07-03 DIAGNOSIS — S22080A Wedge compression fracture of T11-T12 vertebra, initial encounter for closed fracture: Secondary | ICD-10-CM | POA: Diagnosis present

## 2023-07-03 DIAGNOSIS — N390 Urinary tract infection, site not specified: Secondary | ICD-10-CM | POA: Diagnosis present

## 2023-07-03 DIAGNOSIS — I2510 Atherosclerotic heart disease of native coronary artery without angina pectoris: Secondary | ICD-10-CM | POA: Diagnosis present

## 2023-07-03 DIAGNOSIS — M6282 Rhabdomyolysis: Secondary | ICD-10-CM | POA: Diagnosis not present

## 2023-07-03 DIAGNOSIS — I214 Non-ST elevation (NSTEMI) myocardial infarction: Secondary | ICD-10-CM | POA: Diagnosis present

## 2023-07-03 DIAGNOSIS — N3000 Acute cystitis without hematuria: Secondary | ICD-10-CM | POA: Diagnosis not present

## 2023-07-03 DIAGNOSIS — M549 Dorsalgia, unspecified: Secondary | ICD-10-CM | POA: Diagnosis not present

## 2023-07-03 DIAGNOSIS — Z85828 Personal history of other malignant neoplasm of skin: Secondary | ICD-10-CM

## 2023-07-03 DIAGNOSIS — M7989 Other specified soft tissue disorders: Secondary | ICD-10-CM | POA: Diagnosis not present

## 2023-07-03 DIAGNOSIS — F32A Depression, unspecified: Secondary | ICD-10-CM | POA: Diagnosis present

## 2023-07-03 DIAGNOSIS — I517 Cardiomegaly: Secondary | ICD-10-CM | POA: Diagnosis not present

## 2023-07-03 DIAGNOSIS — Z8249 Family history of ischemic heart disease and other diseases of the circulatory system: Secondary | ICD-10-CM

## 2023-07-03 DIAGNOSIS — W010XXA Fall on same level from slipping, tripping and stumbling without subsequent striking against object, initial encounter: Secondary | ICD-10-CM | POA: Diagnosis present

## 2023-07-03 DIAGNOSIS — Z7982 Long term (current) use of aspirin: Secondary | ICD-10-CM | POA: Diagnosis not present

## 2023-07-03 DIAGNOSIS — R0989 Other specified symptoms and signs involving the circulatory and respiratory systems: Secondary | ICD-10-CM | POA: Diagnosis not present

## 2023-07-03 DIAGNOSIS — M5030 Other cervical disc degeneration, unspecified cervical region: Secondary | ICD-10-CM | POA: Diagnosis not present

## 2023-07-03 DIAGNOSIS — M4854XA Collapsed vertebra, not elsewhere classified, thoracic region, initial encounter for fracture: Secondary | ICD-10-CM | POA: Diagnosis not present

## 2023-07-03 DIAGNOSIS — Z885 Allergy status to narcotic agent status: Secondary | ICD-10-CM

## 2023-07-03 DIAGNOSIS — E1122 Type 2 diabetes mellitus with diabetic chronic kidney disease: Secondary | ICD-10-CM | POA: Diagnosis not present

## 2023-07-03 DIAGNOSIS — R4182 Altered mental status, unspecified: Secondary | ICD-10-CM | POA: Diagnosis not present

## 2023-07-03 DIAGNOSIS — R54 Age-related physical debility: Secondary | ICD-10-CM | POA: Diagnosis present

## 2023-07-03 DIAGNOSIS — Z8673 Personal history of transient ischemic attack (TIA), and cerebral infarction without residual deficits: Secondary | ICD-10-CM

## 2023-07-03 DIAGNOSIS — S199XXA Unspecified injury of neck, initial encounter: Secondary | ICD-10-CM | POA: Diagnosis not present

## 2023-07-03 DIAGNOSIS — M1711 Unilateral primary osteoarthritis, right knee: Secondary | ICD-10-CM | POA: Diagnosis not present

## 2023-07-03 DIAGNOSIS — E1129 Type 2 diabetes mellitus with other diabetic kidney complication: Secondary | ICD-10-CM | POA: Diagnosis present

## 2023-07-03 DIAGNOSIS — M4319 Spondylolisthesis, multiple sites in spine: Secondary | ICD-10-CM | POA: Diagnosis not present

## 2023-07-03 LAB — CBC
HCT: 34.7 % — ABNORMAL LOW (ref 36.0–46.0)
Hemoglobin: 11.3 g/dL — ABNORMAL LOW (ref 12.0–15.0)
MCH: 30.2 pg (ref 26.0–34.0)
MCHC: 32.6 g/dL (ref 30.0–36.0)
MCV: 92.8 fL (ref 80.0–100.0)
Platelets: 194 10*3/uL (ref 150–400)
RBC: 3.74 MIL/uL — ABNORMAL LOW (ref 3.87–5.11)
RDW: 14.3 % (ref 11.5–15.5)
WBC: 17 10*3/uL — ABNORMAL HIGH (ref 4.0–10.5)
nRBC: 0 % (ref 0.0–0.2)

## 2023-07-03 LAB — URINALYSIS, ROUTINE W REFLEX MICROSCOPIC
Bilirubin Urine: NEGATIVE
Glucose, UA: NEGATIVE mg/dL
Ketones, ur: 20 mg/dL — AB
Nitrite: NEGATIVE
Protein, ur: 100 mg/dL — AB
Specific Gravity, Urine: 1.011 (ref 1.005–1.030)
pH: 5 (ref 5.0–8.0)

## 2023-07-03 LAB — TROPONIN I (HIGH SENSITIVITY)
Troponin I (High Sensitivity): 92 ng/L — ABNORMAL HIGH (ref <18)
Troponin I (High Sensitivity): 123 ng/L (ref <18)
Troponin I (High Sensitivity): 123 ng/L (ref <18)

## 2023-07-03 LAB — COMPREHENSIVE METABOLIC PANEL
ALT: 19 U/L (ref 0–44)
AST: 45 U/L — ABNORMAL HIGH (ref 15–41)
Albumin: 4.5 g/dL (ref 3.5–5.0)
Alkaline Phosphatase: 63 U/L (ref 38–126)
Anion gap: 21 — ABNORMAL HIGH (ref 5–15)
BUN: 42 mg/dL — ABNORMAL HIGH (ref 8–23)
CO2: 16 mmol/L — ABNORMAL LOW (ref 22–32)
Calcium: 10.9 mg/dL — ABNORMAL HIGH (ref 8.9–10.3)
Chloride: 105 mmol/L (ref 98–111)
Creatinine, Ser: 2.26 mg/dL — ABNORMAL HIGH (ref 0.44–1.00)
GFR, Estimated: 21 mL/min — ABNORMAL LOW (ref >60)
Glucose, Bld: 128 mg/dL — ABNORMAL HIGH (ref 70–99)
Potassium: 3.5 mmol/L (ref 3.5–5.1)
Sodium: 142 mmol/L (ref 135–145)
Total Bilirubin: 1.7 mg/dL — ABNORMAL HIGH (ref 0.0–1.2)
Total Protein: 7.2 g/dL (ref 6.5–8.1)

## 2023-07-03 LAB — CBG MONITORING, ED: Glucose-Capillary: 103 mg/dL — ABNORMAL HIGH (ref 70–99)

## 2023-07-03 LAB — PROCALCITONIN: Procalcitonin: 0.99 ng/mL

## 2023-07-03 LAB — BRAIN NATRIURETIC PEPTIDE: B Natriuretic Peptide: 461 pg/mL — ABNORMAL HIGH (ref 0.0–100.0)

## 2023-07-03 LAB — PROTIME-INR
INR: 1.2 (ref 0.8–1.2)
Prothrombin Time: 15.3 s — ABNORMAL HIGH (ref 11.4–15.2)

## 2023-07-03 LAB — LACTIC ACID, PLASMA: Lactic Acid, Venous: 1 mmol/L (ref 0.5–1.9)

## 2023-07-03 LAB — CK: Total CK: 510 U/L — ABNORMAL HIGH (ref 38–234)

## 2023-07-03 MED ORDER — HYDRALAZINE HCL 20 MG/ML IJ SOLN: 5.0000 mg | INTRAMUSCULAR | Status: DC | PRN

## 2023-07-03 MED ORDER — ASPIRIN 81 MG PO CHEW
324.0000 mg | CHEWABLE_TABLET | Freq: Once | ORAL | Status: AC
  Administered 2023-07-03: 324 mg via ORAL
  Filled 2023-07-03: qty 4

## 2023-07-03 MED ORDER — ALBUTEROL SULFATE HFA 108 (90 BASE) MCG/ACT IN AERS: 2.0000 | INHALATION_SPRAY | RESPIRATORY_TRACT | Status: DC | PRN

## 2023-07-03 MED ORDER — DM-GUAIFENESIN ER 30-600 MG PO TB12: 1.0000 | ORAL_TABLET | Freq: Two times a day (BID) | ORAL | Status: DC | PRN

## 2023-07-03 MED ORDER — METHOCARBAMOL 500 MG PO TABS: 500.0000 mg | ORAL_TABLET | Freq: Three times a day (TID) | ORAL | Status: DC | PRN

## 2023-07-03 MED ORDER — INSULIN ASPART 100 UNIT/ML IJ SOLN
0.0000 [IU] | Freq: Three times a day (TID) | INTRAMUSCULAR | Status: DC
  Administered 2023-07-04: 1 [IU] via SUBCUTANEOUS
  Filled 2023-07-03: qty 1

## 2023-07-03 MED ORDER — VANCOMYCIN HCL IN DEXTROSE 1-5 GM/200ML-% IV SOLN
1000.0000 mg | Freq: Once | INTRAVENOUS | Status: AC
  Administered 2023-07-03: 1000 mg via INTRAVENOUS
  Filled 2023-07-03: qty 200

## 2023-07-03 MED ORDER — INSULIN ASPART 100 UNIT/ML IJ SOLN: 0.0000 [IU] | Freq: Every day | INTRAMUSCULAR | Status: DC

## 2023-07-03 MED ORDER — ACETAMINOPHEN 325 MG PO TABS
650.0000 mg | ORAL_TABLET | Freq: Four times a day (QID) | ORAL | Status: DC | PRN
Start: 2023-07-03 — End: 2023-07-09
  Administered 2023-07-04 – 2023-07-06 (×2): 650 mg via ORAL
  Filled 2023-07-03 (×3): qty 2

## 2023-07-03 MED ORDER — HEPARIN SODIUM (PORCINE) 5000 UNIT/ML IJ SOLN
5000.0000 [IU] | Freq: Three times a day (TID) | INTRAMUSCULAR | Status: DC
  Administered 2023-07-03: 5000 [IU] via SUBCUTANEOUS
  Filled 2023-07-03: qty 1

## 2023-07-03 MED ORDER — ALBUTEROL SULFATE (2.5 MG/3ML) 0.083% IN NEBU: 2.5000 mg | INHALATION_SOLUTION | RESPIRATORY_TRACT | Status: DC | PRN

## 2023-07-03 MED ORDER — SODIUM CHLORIDE 0.9 % IV SOLN: INTRAVENOUS | Status: DC

## 2023-07-03 MED ORDER — SODIUM CHLORIDE 0.9 % IV SOLN
2.0000 g | Freq: Once | INTRAVENOUS | Status: AC
  Administered 2023-07-03: 2 g via INTRAVENOUS
  Filled 2023-07-03: qty 12.5

## 2023-07-03 MED ORDER — OXYCODONE-ACETAMINOPHEN 5-325 MG PO TABS
1.0000 | ORAL_TABLET | ORAL | Status: DC | PRN
  Administered 2023-07-04 – 2023-07-05 (×2): 1 via ORAL
  Filled 2023-07-03 (×2): qty 1

## 2023-07-03 MED ORDER — LACTATED RINGERS IV BOLUS
1000.0000 mL | Freq: Once | INTRAVENOUS | Status: AC
  Administered 2023-07-03: 1000 mL via INTRAVENOUS

## 2023-07-03 MED ORDER — LIDOCAINE 5 % EX PTCH
1.0000 | MEDICATED_PATCH | Freq: Every day | CUTANEOUS | Status: DC
  Administered 2023-07-03 – 2023-07-08 (×6): 1 via TRANSDERMAL
  Filled 2023-07-03 (×7): qty 1

## 2023-07-03 MED ORDER — ONDANSETRON HCL 4 MG/2ML IJ SOLN: 4.0000 mg | Freq: Three times a day (TID) | INTRAMUSCULAR | Status: DC | PRN

## 2023-07-03 NOTE — ED Triage Notes (Signed)
 Patient arrived by [facility] from home. Bystander saw patient laying in yard and called 911. Patient c/o back pain and back of head pain. Unknown downtime. Patient reports slipping and falling backwards with her walker.  Patient A&O x4.

## 2023-07-03 NOTE — ED Notes (Signed)
 Pt complaint of chest pain. Provider Dr.Goodman aware. Orders received

## 2023-07-03 NOTE — ED Notes (Signed)
 Patient arrived in saturated clothes from laying in yard and saturated brief. This RN and [place] Tech changed patients brief and placed in hospital gown. Warm blankets given. Socks put on patient.  Patient belongings placed in belongings bag. Grey Pants, turtle neck shirt, and underwear placed in bag. Arrived with no shoes. Patient has plastic ziplock sandwich bag with her money and ID

## 2023-07-03 NOTE — ED Notes (Signed)
 Water provided to pt at this time per provider order.

## 2023-07-03 NOTE — ED Notes (Signed)
 Patient transported to X-ray

## 2023-07-03 NOTE — ED Provider Triage Note (Signed)
 Emergency Medicine Provider Triage Evaluation Note  IVOREE FELMLEE , a 83 y.o. female  was evaluated in triage.  Pt arrives via EMS for altered mental status. Neighbor called after finding her laying in the yard. Patient complains of headache, neck pain, and low back pain. Unable to recall when she fell.  Physical Exam  BP (!) 150/90 (BP Location: Right Arm)   Temp 98.1 F (36.7 C) (Oral)   Resp 16   Ht 5\' 3"  (1.6 m)   Wt 68 kg   SpO2 96%   BMI 26.57 kg/m  Gen:   Awake, no distress   Resp:  Normal effort  MSK:   Moves extremities without difficulty  Other:    Medical Decision Making  Medically screening exam initiated at 5:07 PM.  Appropriate orders placed.  NAYARA TAPLIN was informed that the remainder of the evaluation will be completed by another provider, this initial triage assessment does not replace that evaluation, and the importance of remaining in the ED until their evaluation is complete.     Chinita Pester, FNP 07/03/23 [phone]

## 2023-07-03 NOTE — ED Notes (Signed)
 Pt incontinent to bladder. Pt cleaned and new brief applied.

## 2023-07-03 NOTE — ED Provider Notes (Signed)
 [facility] Provider Note    Event Date/Time   First MD Initiated Contact with Patient 07/03/23 1753     (approximate)   History   Fall   HPI  [name] is a 83 y.o. female who presents to the emergency department today after being found down in her yard.  Is unclear why the patient fell.  Additionally somewhat unclear when the patient fell.  She told nurse that she fell last night however she told myself that she fell 3 nights ago.  Patient is complaining of some back pain as well as leg pain.  She denies any recent illness or fevers.     Physical Exam   Triage Vital Signs: ED Triage Vitals  Encounter Vitals Group     BP 07/03/23 1657 (!) 150/90     Systolic BP Percentile --      Diastolic BP Percentile --      Pulse Rate 07/03/23 1726 (!) 135     Resp 07/03/23 1657 16     Temp 07/03/23 1657 98.1 F (36.7 C)     Temp Source 07/03/23 1657 Oral     SpO2 07/03/23 1657 96 %     Weight 07/03/23 1659 150 lb (68 kg)     Height 07/03/23 1659 5\' 3"  (1.6 m)     Head Circumference --      Peak Flow --      Pain Score 07/03/23 1659 6     Pain Loc --      Pain Education --      Exclude from Growth Chart --     Most recent vital signs: Vitals:   07/03/23 1726 07/03/23 1935  BP:  (!) 116/45  Pulse: (!) 135 83  Resp:  14  Temp:  98.2 F (36.8 C)  SpO2:  100%   General: Awake, alert, not completely oriented. CV:  Good peripheral perfusion. Irregular rhythm. Resp:  Normal effort. Lungs clear. Abd:  No distention.  Other:  Slight erythema and warmth to right lower leg   ED Results / Procedures / Treatments   Labs (all labs ordered are listed, but only abnormal results are displayed) Labs Reviewed  COMPREHENSIVE METABOLIC PANEL - Abnormal; Notable for the following components:      Result Value   CO2 16 (*)    Glucose, Bld 128 (*)    BUN 42 (*)    Creatinine, Ser 2.26 (*)    Calcium 10.9 (*)    AST 45 (*)    Total Bilirubin 1.7  (*)    GFR, Estimated 21 (*)    Anion gap 21 (*)    All other components within normal limits  CBC - Abnormal; Notable for the following components:   WBC 17.0 (*)    RBC 3.74 (*)    Hemoglobin 11.3 (*)    HCT 34.7 (*)    All other components within normal limits  CK - Abnormal; Notable for the following components:   Total CK 510 (*)    All other components within normal limits  TROPONIN I (HIGH SENSITIVITY) - Abnormal; Notable for the following components:   Troponin I (High Sensitivity) 92 (*)    All other components within normal limits  CULTURE, BLOOD (ROUTINE X 2)  CULTURE, BLOOD (ROUTINE X 2)  LACTIC ACID, PLASMA  LACTIC ACID, PLASMA  URINALYSIS, ROUTINE W REFLEX MICROSCOPIC  TROPONIN I (HIGH SENSITIVITY)     EKG  I, Phineas Semen, attending physician,  personally viewed and interpreted this EKG  EKG Time: 1716 Rate: unclear Rhythm: irregular rhythm. Axis: normal Intervals: qtc 308 QRS: narrow ST changes: no st elevation Impression: significant artifact limits evaluation    RADIOLOGY I independently interpreted and visualized the cervical spine. My interpretation: No fracture Radiology interpretation:  IMPRESSION:  1. No acute fracture or traumatic listhesis of the cervical spine.  2. Multilevel degenerative disc and joint changes as above.    I independently interpreted and visualized the ct head. My interpretation: No ICH Radiology interpretation:  IMPRESSION:  1. No acute intracranial abnormality.  2. 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    I independently interpreted and visualized the CXR. My interpretation: No pneumonia Radiology interpretation:  IMPRESSION:  Borderline cardiomegaly with mild vascular congestion.    I independently interpreted and visualized the lumbar spine. My interpretation: No fracture Radiology interpretation:  IMPRESSION:  1. Mild superior endplate compression fracture of T12, age   indeterminate.  2. No lumbar spine fracture.  3. Moderate multilevel degenerative disc disease and facet  hypertrophy.  4. Grade 1 anterolisthesis of L4 on L5 and trace anterolisthesis of  L5 on S1.     PROCEDURES:  Critical Care performed: Yes  CRITICAL CARE Performed by: Phineas Semen   Total critical care time: 35 minutes  Critical care time was exclusive of separately billable procedures and treating other patients.  Critical care was necessary to treat or prevent imminent or life-threatening deterioration.  Critical care was time spent personally by me on the following activities: development of treatment plan with patient and/or surrogate as well as nursing, discussions with consultants, evaluation of patient's response to treatment, examination of patient, obtaining history from patient or surrogate, ordering and performing treatments and interventions, ordering and review of laboratory studies, ordering and review of radiographic studies, pulse oximetry and re-evaluation of patient's condition.   Procedures    MEDICATIONS ORDERED IN ED: Medications  vancomycin (VANCOCIN) IVPB 1000 mg/200 mL premix (has no administration in time range)  lactated ringers bolus 1,000 mL (1,000 mLs Intravenous New Bag/Given 07/03/23 1936)  ceFEPIme (MAXIPIME) 2 g in sodium chloride 0.9 % 100 mL IVPB (0 g Intravenous Stopped 07/03/23 2031)     IMPRESSION / MDM / ASSESSMENT AND PLAN / ED COURSE  I reviewed the triage vital signs and the nursing notes.                              Differential diagnosis includes, but is not limited to, fracture, infection, anemia, dehydration, ACS, arrhythmia.  Patient's presentation is most consistent with acute presentation with potential threat to life or bodily function.   The patient is on the cardiac monitor to evaluate for evidence of arrhythmia and/or significant heart rate changes.  Patient presented to the emergency department today after  an unwitnessed fall and being found outside.  Patient's primary complaint is for back pain.  Patient was afebrile here.  On exam patient did have some tenderness along her spine.  Additionally patient had some erythema and warmth to her right lower leg.  Blood work was notable for leukocytosis.  She was started on broad-spectrum antibiotics.  I do wonder if cellulitis of her leg is causing that.  Lactic acid however was normal.  Additionally patient's troponin was initially elevated.  On repeat it stayed elevated however it then stayed stable.  Initial EKG was significant artifact.  Did repeat which did not show  any ST elevation.  This time will give aspirin but will hold heparin.  Discussed with Dr. Clyde Lundborg with the hospitalist service who will evaluate for admission.      FINAL CLINICAL IMPRESSION(S) / ED DIAGNOSES   Final diagnoses:  Fall, initial encounter  Compression fracture of T12 vertebra, initial encounter (HCC)  Elevated troponin      Note:  This document was prepared using Dragon voice recognition software and may include unintentional dictation errors.    Phineas Semen, MD 07/03/23 [id number]

## 2023-07-03 NOTE — H&P (Incomplete)
 History and Physical    [name] VOZ:[id number] DOB: 17-Mar-1941 DOA: 07/03/2023  Referring MD/NP/PA:   PCP: Sherron Monday, MD   Patient coming from:  The patient is coming from home.     Chief Complaint: fall, chest pain, burning on urination,  HPI: [name] is a 83 y.o. female with medical history significant of HTN, HLD, DM, dCHF, depression with anxiety, bipolar, CKD-3B, skin cancer, who presents with fall, chest pain, burning on urination  Per report, bystander saw patient laying in yard and called 911. It is not bradycardia when patient fell exactly. She told nurse that she fell last night, however she told EDP and myself that she fell 3 nights ago. Patient reports slipping and falling backwards with her walker. She injured her back and right lower leg. She has middle back pain and right lower leg pain. She has bruise in right lower leg. She states that she has right chest pain.  Denies SOB.  She has mild dry cough.  No fever or chills.  She has nausea, no vomiting, diarrhea or abdominal pain.  She reports burning on urination, denies dysuria or hematuria.  Denies rectal bleeding or dark stool.  Patient cannot provide detailed information to further characterize her pain.   Data reviewed independently and ED Course: pt was found to have troponin 92 --> 123, WBC 17.0, lactic acid 1.0, worsening renal function with creatinine 2.26, BUN 42, GFR 21 (recent baseline creatinine 1.86 on 03/10/2023), CK5 10, temperature normal, blood pressure 116/45, heart rate of 135 --> 83, RR 16, oxygen saturation 100% on room air.  CT of head negative for acute injury, but showed remote lacunar infarction.  CT of C-spine is negative for acute injury, showed degenerative disc disease.  Chest x-ray showed cardiomegaly and mild vascular congestion.  X-ray of the lumbar spine showed T12 compression fracture. Pt is admitted to telemetry bed as inpatient.  X-ray of L spin: 1. Mild superior  endplate compression fracture of T12, age indeterminate. 2. No lumbar spine fracture. 3. Moderate multilevel degenerative disc disease and facet hypertrophy. 4. Grade 1 anterolisthesis of L4 on L5 and trace anterolisthesis of L5 on S1.   X-ray of right tibia/fibula:  negative   EKG: I have personally reviewed.  First EKG showed tachycardia with heart rate 173, not sure what the underlying rhythm, QTc 308. The repeated EKG seems to be sinus rhythm with frequent PAC versus A-fib?  QTc 508, heart rate 76.   Review of Systems:   General: no fevers, chills, no body weight gain, has fatigue HEENT: no blurry vision, hearing changes or sore throat Respiratory: no dyspnea, coughing, wheezing CV: has chest pain, no palpitations GI: has nausea, no vomiting, abdominal pain, diarrhea, constipation GU: no dysuria, burning on urination, increased urinary frequency, hematuria  Ext: no leg edema. Neuro: no unilateral weakness, numbness, or tingling, no vision change or hearing loss. Has fall Skin: no rash. Has bruise in right lower leg. MSK: has middle back pain and right lower leg pain Heme: No easy bruising.  Travel history: No recent long distant travel.   Allergy:  Allergies  Allergen Reactions   Codeine Nausea And Vomiting    "Makes me deathly sick"    Past Medical History:  Diagnosis Date   Arthritis    "hands, feet, knees, shoulders" (05/05/2014)   Bipolar disorder (HCC)    Cataracts, bilateral    immature   Chronic kidney disease    "dr says they aren't functioning  like they should" (05/05/2014)   Coronary artery disease    Depression    takes Zoloft daily   History of bronchitis 53yrs ago   Hyperlipidemia    takes Atorvastatin daily   Hypertension    takes Metoprolol,Lisinopril,and Apresoline daily   Joint pain    Peripheral edema    takes Chlothalidone daily   Squamous cell cancer of skin of nose 2012   "right"   Squamous cell cancer of skin of nose    Type II  diabetes mellitus (HCC)    takes Tradjenta daily    Past Surgical History:  Procedure Laterality Date   COLONOSCOPY WITH PROPOFOL N/A 02/03/2018   Procedure: COLONOSCOPY WITH PROPOFOL;  Surgeon: Christena Deem, MD;  Location: [facility] ENDOSCOPY;  Service: Endoscopy;  Laterality: N/A;   CORONARY ARTERY BYPASS GRAFT  05/12/2007   "CABG X1; at [facility]"   coronary artery bypass with arterial grafts     DILATION AND CURETTAGE OF UTERUS  2005   FRACTURE SURGERY     I & D EXTREMITY Right 04/30/2014   Procedure: IRRIGATION AND DEBRIDEMENT EXTREMITY;  Surgeon: Toni Arthurs, MD;  Location: [facility] OR;  Service: Orthopedics;  Laterality: Right;   INCISION AND DRAINAGE Right 08/11/2014   Procedure: REMOVAL OF DEEP IMPLANTS OF RIGHT TIBIAL FIBIAL INCISION AND DRAINAGE OF RIGHT ANKLE WOUND;  Surgeon: Toni Arthurs, MD;  Location: [facility] OR;  Service: Orthopedics;  Laterality: Right;   ORIF ANKLE FRACTURE Right 04/30/2014   Procedure: OPEN REDUCTION INTERNAL FIXATION (ORIF) ANKLE FRACTURE;  Surgeon: Toni Arthurs, MD;  Location: [facility] OR;  Service: Orthopedics;  Laterality: Right;   REMOVAL OF IMPLANT Right 08/11/2014   Procedure: REMOVAL OF DEEP IMPLANTS OF RIGHT TIBIAL FIBIAL INCISION AND DRAINAGE OF RIGHT ANKLE WOUND;  Surgeon: Toni Arthurs, MD;  Location: [facility] OR;  Service: Orthopedics;  Laterality: Right;   SQUAMOUS CELL CARCINOMA EXCISION Right 2012   "nose"   VAGINAL HYSTERECTOMY  2007    Social History:  reports that she has never smoked. She has never used smokeless tobacco. She reports that she does not drink alcohol and does not use drugs.  Family History:  Family History  Problem Relation Age of Onset   Heart disease Sister      Prior to Admission medications   Medication Sig Start Date End Date Taking? Authorizing Provider  amLODipine (NORVASC) 10 MG tablet TAKE 1 TABLET(10 MG) BY MOUTH DAILY 02/14/23   Sherron Monday, MD  Ascorbic Acid (VITAMIN C PO) Take by mouth daily.    [provider]   aspirin EC 325 MG tablet Take 325 mg by mouth daily.    [provider]  carvedilol (COREG) 6.25 MG tablet TAKE 1 TABLET(6.25 MG) BY MOUTH TWICE DAILY 06/28/23   Sherron Monday, MD  chlorthalidone (HYGROTON) 25 MG tablet TAKE 1 TABLET BY MOUTH EVERY MORNING Patient not taking: Reported on 06/26/2023 03/21/23   Sherron Monday, MD  Cholecalciferol (VITAMIN D3) 5000 units TABS Take 1 tablet by mouth daily.    [provider]  fluticasone (FLONASE) 50 MCG/ACT nasal spray Place 1 spray into both nostrils daily.    [provider]  furosemide (LASIX) 20 MG tablet TAKE 1 TABLET(20 MG) BY MOUTH DAILY AS NEEDED 04/25/23   Sherron Monday, MD  gabapentin (NEURONTIN) 300 MG capsule Take 1 capsule (300 mg total) by mouth daily in the afternoon. 04/11/23 07/10/23  Sherron Monday, MD  hydrALAZINE (APRESOLINE) 50 MG tablet Take 1 tablet (50  mg total) by mouth 3 (three) times daily. 04/11/23 07/10/23  Sherron Monday, MD  lisinopril (ZESTRIL) 10 MG tablet Take 1 tablet (10 mg total) by mouth daily. 04/11/23 07/10/23  Sherron Monday, MD  pioglitazone (ACTOS) 15 MG tablet TAKE 1 TABLET BY MOUTH EVERY MORNING 02/17/23   Sherron Monday, MD  rosuvastatin (CRESTOR) 40 MG tablet TAKE 1 TABLET BY MOUTH EVERY DAY 08/12/22   Sherron Monday, MD  sertraline (ZOLOFT) 50 MG tablet TAKE 1 TABLET(50 MG) BY MOUTH EVERY MORNING 04/25/23   Sherron Monday, MD  tiZANidine (ZANAFLEX) 4 MG capsule Take 4 mg by mouth.    [provider]    Physical Exam: Vitals:   07/03/23 2145 07/03/23 2215 07/03/23 2245 07/03/23 2344  BP:      Pulse: 78 72 96   Resp: 19 20 19    Temp:    98.8 F (37.1 C)  TempSrc:    Oral  SpO2: 99% 99% 96%   Weight:      Height:       General: Not in acute distress HEENT:       Eyes: PERRL, EOMI, no jaundice       ENT: No discharge from the ears and nose, no pharynx injection, no tonsillar enlargement.        Neck: No JVD, no bruit, no mass  felt. Heme: No neck lymph node enlargement. Cardiac: S1/S2, RRR, No murmurs, No gallops or rubs. Respiratory: No rales, wheezing, rhonchi or rubs. GI: Soft, nondistended, nontender, no rebound pain, no organomegaly, BS present. GU: No hematuria Ext: No pitting leg edema bilaterally. 1+DP/PT pulse bilaterally. Musculoskeletal: has tenderness in the middle back and right lower leg Skin:  has bruise in right lower leg, does not seem to have cellulitis.   Neuro: Alert, oriented X3, cranial nerves II-XII grossly intact, moves all extremities normally.  Psych: Patient is not psychotic, no suicidal or hemocidal ideation.  Labs on Admission: I have personally reviewed following labs and imaging studies  CBC: Recent Labs  Lab 07/03/23 1727  WBC 17.0*  HGB 11.3*  HCT 34.7*  MCV 92.8  PLT 194   Basic Metabolic Panel: Recent Labs  Lab 07/03/23 1727  NA 142  K 3.5  CL 105  CO2 16*  GLUCOSE 128*  BUN 42*  CREATININE 2.26*  CALCIUM 10.9*   GFR: Estimated Creatinine Clearance: 17.4 mL/min (A) (by C-G formula based on SCr of 2.26 mg/dL (H)). Liver Function Tests: Recent Labs  Lab 07/03/23 1727  AST 45*  ALT 19  ALKPHOS 63  BILITOT 1.7*  PROT 7.2  ALBUMIN 4.5   No results for input(s): "LIPASE", "AMYLASE" in the last 168 hours. No results for input(s): "AMMONIA" in the last 168 hours. Coagulation Profile: Recent Labs  Lab 07/03/23 1727  INR 1.2   Cardiac Enzymes: Recent Labs  Lab 07/03/23 1727  CKTOTAL 510*   BNP (last 3 results) No results for input(s): "PROBNP" in the last 8760 hours. HbA1C: No results for input(s): "HGBA1C" in the last 72 hours. CBG: Recent Labs  Lab 07/03/23 2311  GLUCAP 103*   Lipid Profile: No results for input(s): "CHOL", "HDL", "LDLCALC", "TRIG", "CHOLHDL", "LDLDIRECT" in the last 72 hours. Thyroid Function Tests: No results for input(s): "TSH", "T4TOTAL", "FREET4", "T3FREE", "THYROIDAB" in the last 72 hours. Anemia Panel: No  results for input(s): "VITAMINB12", "FOLATE", "FERRITIN", "TIBC", "IRON", "RETICCTPCT" in the last 72 hours. Urine analysis:    Component Value Date/Time   COLORURINE YELLOW (  A) 07/03/2023 2150   APPEARANCEUR HAZY (A) 07/03/2023 2150   LABSPEC 1.011 07/03/2023 2150   PHURINE 5.0 07/03/2023 2150   GLUCOSEU NEGATIVE 07/03/2023 2150   HGBUR MODERATE (A) 07/03/2023 2150   BILIRUBINUR NEGATIVE 07/03/2023 2150   BILIRUBINUR Negative 04/11/2023 1147   KETONESUR 20 (A) 07/03/2023 2150   PROTEINUR 100 (A) 07/03/2023 2150   UROBILINOGEN negative (A) 04/11/2023 1147   UROBILINOGEN 0.2 05/01/2014 0634   NITRITE NEGATIVE 07/03/2023 2150   LEUKOCYTESUR SMALL (A) 07/03/2023 2150   Sepsis Labs: @LABRCNTIP (procalcitonin:4,lacticidven:4) )No results found for this or any previous visit (from the past 240 hours).   Radiological Exams on Admission:   Assessment/Plan Principal Problem:   NSTEMI (non-ST elevated myocardial infarction) (HCC) Active Problems:   UTI (urinary tract infection)   CAD (coronary artery disease)   Tachycardia   Fall at home, initial encounter   T12 compression fracture [facility])   Essential hypertension   Hyperlipidemia   Acute renal failure superimposed on stage 3b chronic kidney disease (HCC)   Type II diabetes mellitus with renal manifestations (HCC)   Chronic diastolic CHF (congestive heart failure) (HCC)   Elevated CK   Rhabdomyolysis   Depression   Assessment and Plan:   NSTEMI (non-ST elevated myocardial infarction) and hx of CAD: Patient reports red chest pain, troponin 92 --> 123.  Consulted Dr. Darrold Junker of cardiology  - admit to tele bed as inpatient - IV heparin - Trend Trop - prn Nitroglycerin - Aspirin, Crestor - Risk factor stratification: will check FLP and A1C  - get 2d echo  UTI (urinary tract infection): Patient has WBC 17.0, but no fever.  Lactic acid is normal 1.0.  Clinically does not seem to have sepsis. -IV Rocephin (patient received 1  dose of vancomycin and cefepime in ED due to concerning for right leg cellulitis, but patient does not seem to have cellulitis). -Follow-up blood culture and urine culture  Tachycardia:   First EKG showed tachycardia with heart rate 173, not sure what is her underlying rhythm. The repeated EKG seems to be sinus rhythm with frequent PAC versus A-fib? heart rate 76. Will consult cardiology for further clarification. -Patient is on IV heparin as above -Continue Coreg 6.25 mg twice daily -check Mg level  Fall at home, initial encounter -Fall precaution -PT/OT  T12 compression fracture (HCC): -Pain control, as needed Percocet, Tylenol -Lidoderm -Robaxin  Essential hypertension: Blood pressure 116/45.  Patient is not very sure what medication she is taking. -Continue Coreg -IV hydralazine as needed  Hyperlipidemia -Crestor  Acute renal failure superimposed on stage 3b chronic kidney disease (HCC) -Hold home diuretics, lisinopril -IV fluid: 1 L LR in ED, then 50 cc/h of normal saline  Type II diabetes mellitus with renal manifestations [facility]): Recent A1c 5.6, well-controlled.  Patient is taking Actos -Sliding scale insulin  Chronic diastolic CHF (congestive heart failure) (HCC): 2D echo 05/02/2014 showed EF of 55 to 60% with grade 2 diastolic dysfunction.  Patient does not have leg edema or JVD.  No oxygen desaturation.  Does not seem to have CHF exacerbation. -Check BNP --> 461 -hold diuretics due to worsening renal function and rhabdomyolysis.  Elevated CK and mild rhabdomyolysis: CK 510 -IV fluid as above  Depression -Zoloft     DVT ppx: on IV Heparin     Code Status: Full code    Family Communication:     not done, no family member is at bed side.      Disposition Plan:  Anticipate discharge  back to previous environment  Consults called: Dr. Darrold Junker of cardiology  Admission status and Level of care: Telemetry Cardiac: as inpt          Dispo: The patient is  from: Home              Anticipated d/c is to: Home              Anticipated d/c date is: 2 days              Patient currently is not medically stable to d/c.    Severity of Illness:  The appropriate patient status for this patient is INPATIENT. Inpatient status is judged to be reasonable and necessary in order to provide the required intensity of service to ensure the patient's safety. The patient's presenting symptoms, physical exam findings, and initial radiographic and laboratory data in the context of their chronic comorbidities is felt to place them at high risk for further clinical deterioration. Furthermore, it is not anticipated that the patient will be medically stable for discharge from the hospital within 2 midnights of admission.   * I certify that at the point of admission it is my clinical judgment that the patient will require inpatient hospital care spanning beyond 2 midnights from the point of admission due to high intensity of service, high risk for further deterioration and high frequency of surveillance required.*       Date of Service 07/04/2023    Lorretta Harp [facility]   If 7PM-7AM, please contact night-coverage [email or web address] 07/04/2023, 1:14 AM

## 2023-07-04 ENCOUNTER — Encounter: Payer: Self-pay | Admitting: Internal Medicine

## 2023-07-04 DIAGNOSIS — I214 Non-ST elevation (NSTEMI) myocardial infarction: Secondary | ICD-10-CM

## 2023-07-04 DIAGNOSIS — R079 Chest pain, unspecified: Secondary | ICD-10-CM | POA: Diagnosis not present

## 2023-07-04 DIAGNOSIS — R9431 Abnormal electrocardiogram [ECG] [EKG]: Secondary | ICD-10-CM | POA: Diagnosis not present

## 2023-07-04 DIAGNOSIS — R55 Syncope and collapse: Secondary | ICD-10-CM | POA: Diagnosis not present

## 2023-07-04 LAB — HEPARIN LEVEL (UNFRACTIONATED)
Heparin Unfractionated: 0.45 [IU]/mL (ref 0.30–0.70)
Heparin Unfractionated: 0.41 [IU]/mL (ref 0.30–0.70)

## 2023-07-04 LAB — CK: Total CK: 656 U/L — ABNORMAL HIGH (ref 38–234)

## 2023-07-04 LAB — CBC
HCT: 27.6 % — ABNORMAL LOW (ref 36.0–46.0)
Hemoglobin: 9.1 g/dL — ABNORMAL LOW (ref 12.0–15.0)
MCH: 30.4 pg (ref 26.0–34.0)
MCHC: 33 g/dL (ref 30.0–36.0)
MCV: 92.3 fL (ref 80.0–100.0)
Platelets: 156 10*3/uL (ref 150–400)
RBC: 2.99 MIL/uL — ABNORMAL LOW (ref 3.87–5.11)
RDW: 14.4 % (ref 11.5–15.5)
WBC: 15.6 10*3/uL — ABNORMAL HIGH (ref 4.0–10.5)
nRBC: 0 % (ref 0.0–0.2)

## 2023-07-04 LAB — LIPID PANEL
Cholesterol: 106 mg/dL (ref 0–200)
HDL: 44 mg/dL (ref >40)
LDL Cholesterol: 51 mg/dL (ref 0–99)
Total CHOL/HDL Ratio: 2.4 ratio
Triglycerides: 55 mg/dL (ref <150)
VLDL: 11 mg/dL (ref 0–40)

## 2023-07-04 LAB — APTT: aPTT: 30 s (ref 24–36)

## 2023-07-04 LAB — HEMOGLOBIN A1C
Hgb A1c MFr Bld: 5.1 % (ref 4.8–5.6)
Mean Plasma Glucose: 99.67 mg/dL

## 2023-07-04 LAB — BASIC METABOLIC PANEL
Anion gap: 15 (ref 5–15)
BUN: 41 mg/dL — ABNORMAL HIGH (ref 8–23)
CO2: 18 mmol/L — ABNORMAL LOW (ref 22–32)
Calcium: 10 mg/dL (ref 8.9–10.3)
Chloride: 110 mmol/L (ref 98–111)
Creatinine, Ser: 2.01 mg/dL — ABNORMAL HIGH (ref 0.44–1.00)
GFR, Estimated: 24 mL/min — ABNORMAL LOW (ref >60)
Glucose, Bld: 84 mg/dL (ref 70–99)
Potassium: 3.1 mmol/L — ABNORMAL LOW (ref 3.5–5.1)
Sodium: 143 mmol/L (ref 135–145)

## 2023-07-04 LAB — TROPONIN I (HIGH SENSITIVITY)
Troponin I (High Sensitivity): 153 ng/L (ref <18)
Troponin I (High Sensitivity): 167 ng/L (ref <18)
Troponin I (High Sensitivity): 176 ng/L (ref <18)

## 2023-07-04 LAB — CBG MONITORING, ED
Glucose-Capillary: 73 mg/dL (ref 70–99)
Glucose-Capillary: 108 mg/dL — ABNORMAL HIGH (ref 70–99)
Glucose-Capillary: 127 mg/dL — ABNORMAL HIGH (ref 70–99)
Glucose-Capillary: 98 mg/dL (ref 70–99)

## 2023-07-04 LAB — MAGNESIUM: Magnesium: 1.2 mg/dL — ABNORMAL LOW (ref 1.7–2.4)

## 2023-07-04 MED ORDER — ASPIRIN 81 MG PO TBEC
81.0000 mg | DELAYED_RELEASE_TABLET | Freq: Every day | ORAL | Status: DC
  Administered 2023-07-05 – 2023-07-09 (×5): 81 mg via ORAL
  Filled 2023-07-04 (×5): qty 1

## 2023-07-04 MED ORDER — MAGNESIUM SULFATE 2 GM/50ML IV SOLN
2.0000 g | Freq: Once | INTRAVENOUS | Status: AC
  Administered 2023-07-04: 2 g via INTRAVENOUS
  Filled 2023-07-04: qty 50

## 2023-07-04 MED ORDER — HEPARIN (PORCINE) 25000 UT/250ML-% IV SOLN
850.0000 [IU]/h | INTRAVENOUS | Status: DC
  Administered 2023-07-04 – 2023-07-05 (×2): 850 [IU]/h via INTRAVENOUS
  Filled 2023-07-04 (×3): qty 250

## 2023-07-04 MED ORDER — NITROGLYCERIN 0.4 MG SL SUBL: 0.4000 mg | SUBLINGUAL_TABLET | SUBLINGUAL | Status: DC | PRN

## 2023-07-04 MED ORDER — SODIUM CHLORIDE 0.9 % IV SOLN
1.0000 g | INTRAVENOUS | Status: DC
  Administered 2023-07-04 – 2023-07-06 (×3): 1 g via INTRAVENOUS
  Filled 2023-07-04 (×3): qty 10

## 2023-07-04 MED ORDER — POTASSIUM CHLORIDE CRYS ER 20 MEQ PO TBCR
40.0000 meq | EXTENDED_RELEASE_TABLET | ORAL | Status: AC
  Administered 2023-07-04 (×2): 40 meq via ORAL
  Filled 2023-07-04 (×2): qty 2

## 2023-07-04 MED ORDER — SERTRALINE HCL 50 MG PO TABS
50.0000 mg | ORAL_TABLET | Freq: Every day | ORAL | Status: DC
  Administered 2023-07-04 – 2023-07-09 (×6): 50 mg via ORAL
  Filled 2023-07-04 (×6): qty 1

## 2023-07-04 MED ORDER — CARVEDILOL 6.25 MG PO TABS
6.2500 mg | ORAL_TABLET | Freq: Two times a day (BID) | ORAL | Status: DC
  Administered 2023-07-04: 6.25 mg via ORAL
  Filled 2023-07-04: qty 1

## 2023-07-04 MED ORDER — GABAPENTIN 300 MG PO CAPS
300.0000 mg | ORAL_CAPSULE | Freq: Every day | ORAL | Status: DC
  Administered 2023-07-04 – 2023-07-08 (×5): 300 mg via ORAL
  Filled 2023-07-04 (×5): qty 1

## 2023-07-04 MED ORDER — ROSUVASTATIN CALCIUM 10 MG PO TABS
40.0000 mg | ORAL_TABLET | Freq: Every day | ORAL | Status: DC
  Administered 2023-07-05 – 2023-07-09 (×5): 40 mg via ORAL
  Filled 2023-07-04 (×5): qty 4

## 2023-07-04 MED ORDER — VITAMIN D 25 MCG (1000 UNIT) PO TABS
5000.0000 [IU] | ORAL_TABLET | Freq: Every day | ORAL | Status: DC
Start: 2023-07-04 — End: 2023-07-09
  Administered 2023-07-04 – 2023-07-09 (×6): 5000 [IU] via ORAL
  Filled 2023-07-04 (×6): qty 5

## 2023-07-04 MED ORDER — MAGNESIUM SULFATE 4 GM/100ML IV SOLN
4.0000 g | Freq: Once | INTRAVENOUS | Status: AC
  Administered 2023-07-04: 4 g via INTRAVENOUS
  Filled 2023-07-04: qty 100

## 2023-07-04 MED ORDER — ASPIRIN 325 MG PO TBEC
325.0000 mg | DELAYED_RELEASE_TABLET | Freq: Every day | ORAL | Status: DC
  Administered 2023-07-04: 325 mg via ORAL
  Filled 2023-07-04: qty 1

## 2023-07-04 NOTE — Progress Notes (Signed)
 PROGRESS NOTE    [name]  EXB:[id number] DOB: 1940-07-08 DOA: 07/03/2023 PCP: Sherron Monday, MD  [id number]/[id number]  LOS: 1 day   Brief hospital course:   Assessment & Plan: [name] is a 83 y.o. female with medical history significant of HTN, HLD, DM, dCHF, depression with anxiety, bipolar, CKD-3B, skin cancer, who presented with fall, chest pain, burning on urination   Per report, bystander saw patient laying in yard and called 911.  Patient reports slipping and falling backwards with her walker. She injured her back and right lower leg. She has middle back pain and right lower leg pain.  She states that she has right chest pain.    Trop elevation 2/2 demand ischemia  NSTEMI, ruled out --cardio consulted with Dr. Welton Flakes.  No need for coronary intervention at this time. --cont heparin for 48 hours.  hx of CAD:  --cont ASA and statin   UTI (urinary tract infection):  Patient has WBC 17.0, but no fever.  Lactic acid is normal 1.0.  Clinically does not seem to have sepsis. --cont ceftriaxone pending urine cx   Type I second-degree AV block  --per cardio, hold off beta-blocker --d/c home coreg  Acute renal failure superimposed on stage 3b chronic kidney disease (HCC) -Hold home diuretics, lisinopril --cont MIVF@50    Fall at home, initial encounter -Fall precaution -PT/OT   T12 compression fracture [facility]): -Mild superior endplate compression fracture of T12, age indeterminate. --Pain control, as needed Percocet, Tylenol -Lidoderm -Robaxin   Essential hypertension:  Patient is not very sure what medication she is taking. --d/c coreg due to AV block   Hyperlipidemia -Crestor   Type II diabetes mellitus with renal manifestations (HCC):  Recent A1c 5.6, well-controlled.  Patient is taking Actos --d/c BG checks and SSI, no need.   Chronic diastolic CHF (congestive heart failure) (HCC):  2D echo 05/02/2014 showed EF of 55 to 60% with grade 2 diastolic  dysfunction.  Patient does not have leg edema or JVD.  No oxygen desaturation.  Does not seem to have CHF exacerbation. -Check BNP --> 461 -hold diuretics due to worsening renal function and rhabdomyolysis.   Elevated CK and mild rhabdomyolysis:  CK 510 --cont MIVF   Depression -Zoloft   DVT prophylaxis: NU:UVOZDGU gtt Code Status: Full code  Family Communication:  Level of care: Telemetry Cardiac Dispo:   The patient is from: home Anticipated d/c is to: SNF rehab Anticipated d/c date is: medically ready 1-2 days   Subjective and Interval History:  Pt reported pain in left shoulder.  No chest pain.   Objective: Vitals:   07/04/23 1600 07/04/23 1730 07/04/23 1800 07/04/23 1931  BP: (!) 113/51  (!) 123/47 (!) 126/42  Pulse: (!) 108 (!) 56 (!) 59 61  Resp: 14 16 14 15   Temp:  98.3 F (36.8 C)  98.3 F (36.8 C)  TempSrc:    Oral  SpO2: 97% 97% 97% 94%  Weight:      Height:        Intake/Output Summary (Last 24 hours) at 07/04/2023 2036 Last data filed at 07/04/2023 1810 Gross per 24 hour  Intake 126 ml  Output --  Net 126 ml   Filed Weights   07/03/23 1659  Weight: 68 kg    Examination:   Constitutional: NAD, AAOx3 HEENT: conjunctivae and lids normal, EOMI CV: No cyanosis.   RESP: normal respiratory effort, on RA Extremities: bruising over right lower leg SKIN: warm, dry Neuro: II - XII  grossly intact.     Data Reviewed: I have personally reviewed labs and imaging studies  Time spent: 50 minutes  Darlin Priestly, MD [facility] If 7PM-7AM, please contact night-coverage 07/04/2023, 8:36 PM

## 2023-07-04 NOTE — ED Notes (Addendum)
 Mag level 1.2 and potassium 3.1 discussed with Manuela Schwartz. Order received. Awaiting Magnesium from pharmacy

## 2023-07-04 NOTE — Evaluation (Signed)
 Physical Therapy Evaluation Patient Details Name: [name] MRN: [id number] DOB: May 08, 1940 Today's Date: 07/04/2023  History of Present Illness  83 y/o female presented to ED on 07/03/23 for fall, chest pain, and burning with urination. Admitted for NSTEMI and UTI. PMH: HTN, DM, dCHF, depression, CKD-3b, skin cancer, bipolar  Clinical Impression  Patient admitted with the above. PTA, patient lives with son and reports independence with mobility with use of RW. Unsure of cognitive baseline as no family present during session. Very HOH so unsure reasoning for cognitive. Patient presents with weakness, impaired balance, and decreased activity tolerance. Required CGA-minA for bed mobility and minA+2 for sit to stand from stretcher and low BSC. Ambulated 89' + 10' with minA+2 and HHAx2 due to lack of access to RW in ED. Patient will benefit from skilled PT services during acute stay to address listed deficits. Patient will benefit from ongoing therapy at discharge to maximize functional independence and safety. Patient will require 24/7 supervision at discharge.         If plan is discharge home, recommend the following: A little help with walking and/or transfers;A little help with bathing/dressing/bathroom;Assistance with cooking/housework;Direct supervision/assist for medications management;Direct supervision/assist for financial management;Assist for transportation;Help with stairs or ramp for entrance;Supervision due to cognitive status   Can travel by private vehicle   Yes    Equipment Recommendations Rolling Laverna Dossett (2 wheels);BSC/3in1  Recommendations for Other Services       Functional Status Assessment Patient has had a recent decline in their functional status and demonstrates the ability to make significant improvements in function in a reasonable and predictable amount of time.     Precautions / Restrictions Precautions Precautions: Fall Recall of Precautions/Restrictions:  Impaired Restrictions Weight Bearing Restrictions Per Provider Order: No      Mobility  Bed Mobility Overal bed mobility: Needs Assistance Bed Mobility: Supine to Sit, Sit to Supine     Supine to sit: Contact guard Sit to supine: Min assist        Transfers Overall transfer level: Needs assistance Equipment used: 2 person hand held assist Transfers: Sit to/from Stand Sit to Stand: Min assist, +2 safety/equipment, +2 physical assistance                Ambulation/Gait Ambulation/Gait assistance: Min assist, +2 physical assistance, +2 safety/equipment Gait Distance (Feet): 12 Feet (+10') Assistive device: 2 person hand held assist Gait Pattern/deviations: Step-through pattern, Decreased stride length Gait velocity: decreased     General Gait Details: assist for balance. use of HHAx2 due to lack of access to RW in ED  Stairs            Wheelchair Mobility     Tilt Bed    Modified Rankin (Stroke Patients Only)       Balance Overall balance assessment: Needs assistance Sitting-balance support: No upper extremity supported, Feet supported Sitting balance-Leahy Scale: Fair     Standing balance support: Bilateral upper extremity supported, Reliant on assistive device for balance Standing balance-Leahy Scale: Poor                               Pertinent Vitals/Pain Pain Assessment Pain Assessment: Faces Faces Pain Scale: No hurt Pain Intervention(s): Monitored during session    Home Living Family/patient expects to be discharged to:: Private residence Living Arrangements: Children Available Help at Discharge: Family Type of Home: House Home Access: Ramped entrance       Home  Layout: Two level;Able to live on main level with bedroom/bathroom Home Equipment: Rolling Benito Lemmerman (2 wheels);Wheelchair - manual      Prior Function Prior Level of Function : Independent/Modified Independent;History of Falls (last six months)              Mobility Comments: uses RW for mobility; hx of falls       Extremity/Trunk Assessment   Upper Extremity Assessment Upper Extremity Assessment: Defer to OT evaluation    Lower Extremity Assessment Lower Extremity Assessment: Generalized weakness       Communication   Communication Communication: Impaired Factors Affecting Communication: Hearing impaired    Cognition Arousal: Alert Behavior During Therapy: WFL for tasks assessed/performed   PT - Cognitive impairments: No family/caregiver present to determine baseline                         Following commands: Impaired Following commands impaired: Follows one step commands with increased time, Follows one step commands inconsistently     Cueing       General Comments      Exercises     Assessment/Plan    PT Assessment Patient needs continued PT services  PT Problem List Decreased strength;Decreased activity tolerance;Decreased balance;Decreased mobility;Decreased cognition;Decreased coordination;Decreased knowledge of use of DME;Decreased safety awareness;Decreased knowledge of precautions;Cardiopulmonary status limiting activity       PT Treatment Interventions DME instruction;Gait training;Balance training;Functional mobility training;Therapeutic activities;Therapeutic exercise;Neuromuscular re-education;Patient/family education    PT Goals (Current goals can be found in the Care Plan section)  Acute Rehab PT Goals Patient Stated Goal: did not state PT Goal Formulation: With patient Time For Goal Achievement: 07/18/23 Potential to Achieve Goals: Good    Frequency Min 2X/week     Co-evaluation PT/OT/SLP Co-Evaluation/Treatment: Yes Reason for Co-Treatment: Necessary to address cognition/behavior during functional activity;For patient/therapist safety;To address functional/ADL transfers PT goals addressed during session: Mobility/safety with mobility;Balance         AM-PAC PT "6 Clicks"  Mobility  Outcome Measure Help needed turning from your back to your side while in a flat bed without using bedrails?: A Little Help needed moving from lying on your back to sitting on the side of a flat bed without using bedrails?: A Little Help needed moving to and from a bed to a chair (including a wheelchair)?: Total Help needed standing up from a chair using your arms (e.g., wheelchair or bedside chair)?: Total Help needed to walk in hospital room?: Total Help needed climbing 3-5 steps with a railing? : Total 6 Click Score: 10    End of Session   Activity Tolerance: Patient tolerated treatment well Patient left: in bed;with call bell/phone within reach Nurse Communication: Mobility status PT Visit Diagnosis: Unsteadiness on feet (R26.81);Muscle weakness (generalized) (M62.81);Difficulty in walking, not elsewhere classified (R26.2)    Time: 1610-9604 PT Time Calculation (min) (ACUTE ONLY): 17 min   Charges:   PT Evaluation $PT Eval Moderate Complexity: 1 Mod   PT General Charges $$ ACUTE PT VISIT: 1 Visit         Maylon Peppers, PT, DPT Physical Therapist - [facility]  [facility]   Madilyne Tadlock A Bogdan Vivona 07/04/2023, 3:13 PM

## 2023-07-04 NOTE — Plan of Care (Signed)

## 2023-07-04 NOTE — ED Notes (Signed)
 Pt incontinent of urine. Pt cleaned and placed in fresh brief.

## 2023-07-04 NOTE — Progress Notes (Signed)
       CROSS COVER NOTE  NAME: ELLARY CASAMENTO MRN: [id number] DOB : [birth year] ATTENDING PHYSICIAN: Lorretta Harp, MD    Date of Service   07/04/2023   HPI/Events of Note   Electrolyte abn mag 1.2. potassium 3.1   Interventions   Assessment/Plan: 6 gm mag IV 2 oral doses 40 meQ of KCL      Donnie Mesa NP [facility] Cross Cover 7pm-7am - check amion for availability Pager [phone]

## 2023-07-04 NOTE — Consult Note (Addendum)
 [name] is a 83 y.o. female  [id number]  Primary Cardiologist: Adrian Blackwater Reason for Consultation: Chest pain and syncope  HPI: This is a 83 year old white female with past medical history of congestive heart failure, CABG with renal insufficiency presented to the hospital after having been found in the yard by a bystander.  She apparently fell wandering in the yard and been lying there for a while.  She has a T12 compression fracture of the spine and lacunar infarct on CT head.  She had mildly elevated troponin and chest pain that has eased off.  She is having pain mostly in the legs and denies any further chest pain.   Review of Systems: No orthopnea PND or leg swelling   Past Medical History:  Diagnosis Date   Arthritis    "hands, feet, knees, shoulders" (05/05/2014)   Bipolar disorder (HCC)    Cataracts, bilateral    immature   Chronic kidney disease    "dr says they aren't functioning like they should" (05/05/2014)   Coronary artery disease    Depression    takes Zoloft daily   History of bronchitis 49yrs ago   Hyperlipidemia    takes Atorvastatin daily   Hypertension    takes Metoprolol,Lisinopril,and Apresoline daily   Joint pain    Peripheral edema    takes Chlothalidone daily   Squamous cell cancer of skin of nose 2012   "right"   Squamous cell cancer of skin of nose    Type II diabetes mellitus (HCC)    takes Tradjenta daily    (Not in a hospital admission)     aspirin EC  325 mg Oral Daily   carvedilol  6.25 mg Oral BID WC   cholecalciferol  5,000 Units Oral Daily   gabapentin  300 mg Oral Q1500   insulin aspart  0-5 Units Subcutaneous QHS   insulin aspart  0-9 Units Subcutaneous TID WC   lidocaine  1 patch Transdermal QHS   potassium chloride  40 mEq Oral Q4H   rosuvastatin  40 mg Oral Daily   sertraline  50 mg Oral Daily    Infusions:  sodium chloride 50 mL/hr at 07/04/23 0145   cefTRIAXone (ROCEPHIN)  IV Stopped (07/04/23 0603)    heparin 850 Units/hr (07/04/23 0746)   magnesium sulfate bolus IVPB     magnesium sulfate bolus IVPB 4 g (07/04/23 0653)    Allergies  Allergen Reactions   Codeine Nausea And Vomiting    "Makes me deathly sick"    Social History   Socioeconomic History   Marital status: Widowed    Spouse name: Not on file   Number of children: Not on file   Years of education: Not on file   Highest education level: Not on file  Occupational History   Not on file  Tobacco Use   Smoking status: Never   Smokeless tobacco: Never  Vaping Use   Vaping status: Never Used  Substance and Sexual Activity   Alcohol use: No    Alcohol/week: 0.0 standard drinks of alcohol   Drug use: No   Sexual activity: Not on file  Other Topics Concern   Not on file  Social History Narrative   Not on file   Social Drivers of Health   Financial Resource Strain: Not on file  Food Insecurity: Not on file  Transportation Needs: Not on file  Physical Activity: Not on file  Stress: Not on file  Social Connections: Not on  file  Intimate Partner Violence: Not on file    Family History  Problem Relation Age of Onset   Heart disease Sister     PHYSICAL EXAM: Vitals:   07/04/23 0730 07/04/23 0744  BP: (!) 117/47   Pulse: 65   Resp: 15   Temp:  98.1 F (36.7 C)  SpO2: 97%      Intake/Output Summary (Last 24 hours) at 07/04/2023 0802 Last data filed at 07/04/2023 0746 Gross per 24 hour  Intake 50.15 ml  Output --  Net 50.15 ml    General:  Well appearing. No respiratory difficulty HEENT: normal Neck: supple. no JVD. Carotids 2+ bilat; no bruits. No lymphadenopathy or thryomegaly appreciated. Cor: PMI nondisplaced. Regular rate & rhythm. No rubs, gallops or murmurs. Lungs: clear Abdomen: soft, nontender, nondistended. No hepatosplenomegaly. No bruits or masses. Good bowel sounds. Extremities: no cyanosis, clubbing, rash, edema Neuro: alert & oriented x 3, cranial nerves grossly intact. moves all 4  extremities w/o difficulty. Affect pleasant.  ECG: Normal sinus rhythm with type II Wenckebach AV block with nonspecific ST-T changes  Results for orders placed or performed during the hospital encounter of 07/03/23 (from the past 24 hours)  Comprehensive metabolic panel     Status: Abnormal   Collection Time: 07/03/23  5:27 PM  Result Value Ref Range   Sodium 142 135 - 145 mmol/L   Potassium 3.5 3.5 - 5.1 mmol/L   Chloride 105 98 - 111 mmol/L   CO2 16 (L) 22 - 32 mmol/L   Glucose, Bld 128 (H) 70 - 99 mg/dL   BUN 42 (H) 8 - 23 mg/dL   Creatinine, Ser 1.30 (H) 0.44 - 1.00 mg/dL   Calcium 86.5 (H) 8.9 - 10.3 mg/dL   Total Protein 7.2 6.5 - 8.1 g/dL   Albumin 4.5 3.5 - 5.0 g/dL   AST 45 (H) 15 - 41 U/L   ALT 19 0 - 44 U/L   Alkaline Phosphatase 63 38 - 126 U/L   Total Bilirubin 1.7 (H) 0.0 - 1.2 mg/dL   GFR, Estimated 21 (L) >60 mL/min   Anion gap 21 (H) 5 - 15  CBC     Status: Abnormal   Collection Time: 07/03/23  5:27 PM  Result Value Ref Range   WBC 17.0 (H) 4.0 - 10.5 K/uL   RBC 3.74 (L) 3.87 - 5.11 MIL/uL   Hemoglobin 11.3 (L) 12.0 - 15.0 g/dL   HCT 78.4 (L) 69.6 - 29.5 %   MCV 92.8 80.0 - 100.0 fL   MCH 30.2 26.0 - 34.0 pg   MCHC 32.6 30.0 - 36.0 g/dL   RDW 28.4 13.2 - 44.0 %   Platelets 194 150 - 400 K/uL   nRBC 0.0 0.0 - 0.2 %  CK     Status: Abnormal   Collection Time: 07/03/23  5:27 PM  Result Value Ref Range   Total CK 510 (H) 38 - 234 U/L  Troponin I (High Sensitivity)     Status: Abnormal   Collection Time: 07/03/23  5:27 PM  Result Value Ref Range   Troponin I (High Sensitivity) 92 (H) <18 ng/L  Brain natriuretic peptide     Status: Abnormal   Collection Time: 07/03/23  5:27 PM  Result Value Ref Range   B Natriuretic Peptide 461.0 (H) 0.0 - 100.0 pg/mL  Protime-INR     Status: Abnormal   Collection Time: 07/03/23  5:27 PM  Result Value Ref Range   Prothrombin Time 15.3 (H)  11.4 - 15.2 seconds   INR 1.2 0.8 - 1.2  Troponin I (High Sensitivity)      Status: Abnormal   Collection Time: 07/03/23  8:45 PM  Result Value Ref Range   Troponin I (High Sensitivity) 123 (HH) <18 ng/L  Lactic acid, plasma     Status: None   Collection Time: 07/03/23  8:45 PM  Result Value Ref Range   Lactic Acid, Venous 1.0 0.5 - 1.9 mmol/L  Procalcitonin     Status: None   Collection Time: 07/03/23  8:45 PM  Result Value Ref Range   Procalcitonin 0.99 ng/mL  Blood culture (routine x 2)     Status: None (Preliminary result)   Collection Time: 07/03/23  8:50 PM   Specimen: BLOOD RIGHT ARM  Result Value Ref Range   Specimen Description BLOOD RIGHT ARM    Special Requests      BOTTLES DRAWN AEROBIC AND ANAEROBIC Blood Culture results may not be optimal due to an inadequate volume of blood received in culture bottles   Culture      NO GROWTH < 12 HOURS Performed at [facility], [address], [place], [place] [address]    Report Status PENDING   Troponin I (High Sensitivity)     Status: Abnormal   Collection Time: 07/03/23  9:45 PM  Result Value Ref Range   Troponin I (High Sensitivity) 123 (HH) <18 ng/L  Urinalysis, Routine w reflex microscopic -Urine, Clean Catch     Status: Abnormal   Collection Time: 07/03/23  9:50 PM  Result Value Ref Range   Color, Urine YELLOW (A) YELLOW   APPearance HAZY (A) CLEAR   Specific Gravity, Urine 1.011 1.005 - 1.030   pH 5.0 5.0 - 8.0   Glucose, UA NEGATIVE NEGATIVE mg/dL   Hgb urine dipstick MODERATE (A) NEGATIVE   Bilirubin Urine NEGATIVE NEGATIVE   Ketones, ur 20 (A) NEGATIVE mg/dL   Protein, ur 956 (A) NEGATIVE mg/dL   Nitrite NEGATIVE NEGATIVE   Leukocytes,Ua SMALL (A) NEGATIVE   RBC / HPF 0-5 0 - 5 RBC/hpf   WBC, UA 21-50 0 - 5 WBC/hpf   Bacteria, UA MANY (A) NONE SEEN   Squamous Epithelial / HPF 0-5 0 - 5 /HPF   WBC Clumps PRESENT    Mucus PRESENT    Hyaline Casts, UA PRESENT   CBG monitoring, ED     Status: Abnormal   Collection Time: 07/03/23 11:11 PM  Result Value Ref Range    Glucose-Capillary 103 (H) 70 - 99 mg/dL  Hemoglobin O1H     Status: None   Collection Time: 07/04/23  1:00 AM  Result Value Ref Range   Hgb A1c MFr Bld 5.1 4.8 - 5.6 %   Mean Plasma Glucose 99.67 mg/dL  APTT     Status: None   Collection Time: 07/04/23  1:00 AM  Result Value Ref Range   aPTT 30 24 - 36 seconds  Troponin I (High Sensitivity)     Status: Abnormal   Collection Time: 07/04/23  1:00 AM  Result Value Ref Range   Troponin I (High Sensitivity) 153 (HH) <18 ng/L  Lipid panel     Status: None   Collection Time: 07/04/23  4:02 AM  Result Value Ref Range   Cholesterol 106 0 - 200 mg/dL   Triglycerides 55 <086 mg/dL   HDL 44 >57 mg/dL   Total CHOL/HDL Ratio 2.4 RATIO   VLDL 11 0 - 40 mg/dL   LDL  Cholesterol 51 0 - 99 mg/dL  CK     Status: Abnormal   Collection Time: 07/04/23  4:02 AM  Result Value Ref Range   Total CK 656 (H) 38 - 234 U/L  Basic metabolic panel     Status: Abnormal   Collection Time: 07/04/23  4:02 AM  Result Value Ref Range   Sodium 143 135 - 145 mmol/L   Potassium 3.1 (L) 3.5 - 5.1 mmol/L   Chloride 110 98 - 111 mmol/L   CO2 18 (L) 22 - 32 mmol/L   Glucose, Bld 84 70 - 99 mg/dL   BUN 41 (H) 8 - 23 mg/dL   Creatinine, Ser 1.61 (H) 0.44 - 1.00 mg/dL   Calcium 09.6 8.9 - 04.5 mg/dL   GFR, Estimated 24 (L) >60 mL/min   Anion gap 15 5 - 15  CBC     Status: Abnormal   Collection Time: 07/04/23  4:02 AM  Result Value Ref Range   WBC 15.6 (H) 4.0 - 10.5 K/uL   RBC 2.99 (L) 3.87 - 5.11 MIL/uL   Hemoglobin 9.1 (L) 12.0 - 15.0 g/dL   HCT 40.9 (L) 81.1 - 91.4 %   MCV 92.3 80.0 - 100.0 fL   MCH 30.4 26.0 - 34.0 pg   MCHC 33.0 30.0 - 36.0 g/dL   RDW 78.2 95.6 - 21.3 %   Platelets 156 150 - 400 K/uL   nRBC 0.0 0.0 - 0.2 %  Magnesium     Status: Abnormal   Collection Time: 07/04/23  4:02 AM  Result Value Ref Range   Magnesium 1.2 (L) 1.7 - 2.4 mg/dL  Troponin I (High Sensitivity)     Status: Abnormal   Collection Time: 07/04/23  4:02 AM  Result Value  Ref Range   Troponin I (High Sensitivity) 167 (HH) <18 ng/L  CBG monitoring, ED     Status: None   Collection Time: 07/04/23  7:42 AM  Result Value Ref Range   Glucose-Capillary 73 70 - 99 mg/dL   DG Tibia/Fibula Right Result Date: 07/04/2023 CLINICAL DATA:  Right lower leg pain, swelling EXAM: RIGHT TIBIA AND FIBULA - 2 VIEW COMPARISON:  05/02/2014 FINDINGS: Degenerative changes in the right knee and right ankle with joint space narrowing and spurring. No acute bony abnormality. Specifically, no fracture, subluxation, or dislocation. IMPRESSION: No acute bony abnormality. Electronically Signed   By: Charlett Nose M.D.   On: 07/04/2023 00:06   CT Cervical Spine Wo Contrast Result Date: 07/03/2023 CLINICAL DATA:  Neck trauma. Found lying in her yard. Altered mental status. EXAM: CT CERVICAL SPINE WITHOUT CONTRAST TECHNIQUE: Multidetector CT imaging of the cervical spine was performed without intravenous contrast. Multiplanar CT image reconstructions were also generated. RADIATION DOSE REDUCTION: This exam was performed according to the departmental dose-optimization program which includes automated exposure control, adjustment of the mA and/or kV according to patient size and/or use of iterative reconstruction technique. COMPARISON:  CT cervical spine 04/30/2014 FINDINGS: Alignment: Plantar dens interval is intact. Moderate degenerative changes in this region are minimally increased from prior. 1.5 mm grade 1 anterolisthesis of C3 on C4, unchanged from prior. 3 mm grade 1 anterolisthesis of C4 on C5 minimally increased from 2 mm previously, likely related to worsened facet joint and disc degenerative changes. Minimal kyphotic angulation centered at C5-6, similar to prior. Skull base and vertebrae: Vertebral body heights are maintained. Mild-to-moderate posterior left greater than right C4-5 disc space narrowing is mildly worsened from prior. Moderate to severe anterior  right C5-6 and moderate to severe  diffuse C6-7 disc space narrowing, similar to prior. Moderate C7-T1 disc space narrowing is mildly worsened from prior. No acute fracture is seen. Soft tissues and spinal canal: No prevertebral fluid or swelling. No visible canal hematoma. Disc levels: Multilevel degenerative changes including disc space narrowing, uncovertebral hypertrophy, and facet joint hypertrophy contribute to mild left C3-4, moderate left C4-5, mild-to-moderate right C5-6, mild bilateral C6-7, and borderline mild left C7-T1 neuroforaminal stenosis. Upper chest: The lung apices are clear. Other: High-grade atherosclerotic calcifications are seen within the vertebral arteries. Incidental note of 1.2 cm low-density right thyroid nodule. No follow-up imaging is recommended. Moderate to high-grade bilateral carotid bulb atherosclerotic calcifications. IMPRESSION: 1. No acute fracture or traumatic listhesis of the cervical spine. 2. Multilevel degenerative disc and joint changes as above. Electronically Signed   By: Neita Garnet M.D.   On: 07/03/2023 18:34   CT Head Wo Contrast Result Date: 07/03/2023 CLINICAL DATA:  Mental status change, unknown cause. Found lying in yard. Headache, neck pain, and lower back pain. EXAM: CT HEAD WITHOUT CONTRAST TECHNIQUE: Contiguous axial images were obtained from the base of the skull through the vertex without intravenous contrast. RADIATION DOSE REDUCTION: This exam was performed according to the departmental dose-optimization program which includes automated exposure control, adjustment of the mA and/or kV according to patient size and/or use of iterative reconstruction technique. COMPARISON:  MRI brain 03/17/2018, CT brain 04/30/2014 FINDINGS: Brain: There is mild cortical atrophy, within normal limits for patient age. The ventricles are normal in configuration. The basilar cisterns are patent. No mass, mass effect, or midline shift. Unchanged focal lucency indicating a remote lacunar infarct superior  today left putamen. No acute intracranial hemorrhage is seen. No abnormal extra-axial fluid collection. Preservation of the normal cortical gray-white interface without CT evidence of an acute major vascular territorial cortical based infarction. Vascular: No hyperdense vessel or unexpected calcification. There are atherosclerotic intracranial calcifications within the skull base. Skull: Normal. Negative for fracture or focal lesion. Sinuses/Orbits: The visualized orbits are unremarkable. The visualized paranasal sinuses and mastoid air cells are clear. Other: None. IMPRESSION: 1. No acute intracranial abnormality. 2. 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 Electronically Signed   By: Neita Garnet M.D.   On: 07/03/2023 18:24   DG Chest 2 View Result Date: 07/03/2023 CLINICAL DATA:  Altered mental status. EXAM: CHEST - 2 VIEW COMPARISON:  09/16/2014 FINDINGS: Prior median sternotomy. The heart is upper normal in size. Mediastinal contours are normal. Mild vascular congestion without edema. No focal airspace disease. No pleural effusion or pneumothorax. The bones are subjectively under mineralized. IMPRESSION: Borderline cardiomegaly with mild vascular congestion. Electronically Signed   By: Narda Rutherford M.D.   On: 07/03/2023 18:12   DG Lumbar Spine 2-3 Views Result Date: 07/03/2023 CLINICAL DATA:  Pain after fall. EXAM: LUMBAR SPINE - 2-3 VIEW COMPARISON:  CT 04/30/2014 FINDINGS: The bones are diffusely under mineralized. Mild superior endplate compression fracture of T12 with approximately 30% loss of height, age indeterminate. The lumbar vertebral body heights are normal. 7 mm anterolisthesis of L4 on L5. Trace anterolisthesis of L5 on S1. Degenerative disc disease with disc space narrowing and spurring L1-L2, L2-L3, L4-L5 and L5-S1. Moderate L4-L5 and mild L5-S1 facet hypertrophy. No sacroiliac diastasis. IMPRESSION: 1. Mild superior endplate compression fracture of T12, age  indeterminate. 2. No lumbar spine fracture. 3. Moderate multilevel degenerative disc disease and facet hypertrophy. 4. Grade 1 anterolisthesis of L4 on L5 and trace  anterolisthesis of L5 on S1. Electronically Signed   By: Narda Rutherford M.D.   On: 07/03/2023 18:11     ASSESSMENT AND PLAN: Mildly elevated troponin 92 and now 123.  Also has renal insufficiency with creatinine 2.26 with a GFR of 21.  Baseline creatinine March 10, 2023 was 1.86.  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  Advise holding off any beta-blockers.  Etiology of fall could be related to high-grade AV block versus dementia.  Since patient is not having chest pain and creatinine is elevated and troponin is borderline suggestive of demand ischemia there is no need for coronary intervention at this time.  Agree with giving heparin over 48 hours.  Will follow the patient closely with you thank you very much for referral.  Adrian Blackwater

## 2023-07-04 NOTE — Evaluation (Signed)
 Occupational Therapy Evaluation Patient Details Name: [name] MRN: [id number] DOB: [birth year] Today's Date: 07/04/2023   History of Present Illness   83 y/o female presented to ED on 07/03/23 for fall, chest pain, and burning with urination. Admitted for NSTEMI and UTI. PMH: HTN, DM, dCHF, depression, CKD-3b, skin cancer, bipolar   Clinical Impressions [name] was seen for OT evaluation this date. Prior to hospital admission, pt was MOD I using RW. Pt lives with son in home with ramped entrance. Pt presents to acute OT demonstrating impaired ADL performance and functional mobility 2/2 decreased activity tolerance and functional strength/ROM/balance deficits. Pt currently requires CGA sup>sit, MIN A sit>sup. MAX A don/doff brief in standing. MIN A pericare standing, assist for standing balance. MIN A x2 + HHA for toilet t/f, +2 lines mgmt. Pt would benefit from skilled OT to address noted impairments and functional limitations (see below for any additional details). Upon hospital discharge, recommend OT follow up <3 hours/day.   If plan is discharge home, recommend the following:   A little help with walking and/or transfers;A little help with bathing/dressing/bathroom     Functional Status Assessment   Patient has had a recent decline in their functional status and demonstrates the ability to make significant improvements in function in a reasonable and predictable amount of time.     Equipment Recommendations   BSC/3in1     Recommendations for Other Services         Precautions/Restrictions   Precautions Precautions: Fall Recall of Precautions/Restrictions: Impaired Restrictions Weight Bearing Restrictions Per Provider Order: No     Mobility Bed Mobility Overal bed mobility: Needs Assistance Bed Mobility: Supine to Sit, Sit to Supine     Supine to sit: Contact guard Sit to supine: Min assist        Transfers Overall transfer level: Needs  assistance Equipment used: 2 person hand held assist Transfers: Sit to/from Stand Sit to Stand: Min assist, +2 physical assistance                  Balance Overall balance assessment: Needs assistance Sitting-balance support: No upper extremity supported, Feet supported Sitting balance-Leahy Scale: Fair     Standing balance support: Bilateral upper extremity supported, Reliant on assistive device for balance Standing balance-Leahy Scale: Poor                             ADL either performed or assessed with clinical judgement   ADL Overall ADL's : Needs assistance/impaired                                       General ADL Comments: MAX A don/doff brief in standing. MIN A pericare standing, assist for standing balance. MIN A x2 + HHA for toilet t/f, +2 lines mgmt      Pertinent Vitals/Pain Pain Assessment Pain Assessment: No/denies pain Faces Pain Scale: No hurt     Extremity/Trunk Assessment Upper Extremity Assessment Upper Extremity Assessment: Generalized weakness   Lower Extremity Assessment Lower Extremity Assessment: Generalized weakness       Communication Communication Communication: Impaired Factors Affecting Communication: Hearing impaired   Cognition Arousal: Alert Behavior During Therapy: WFL for tasks assessed/performed Cognition: No family/caregiver present to determine baseline             OT - Cognition Comments: Oriented to self, location, year,  and situatuation however ? cognition vs HOH                 Following commands: Impaired Following commands impaired: Follows one step commands with increased time     Cueing  General Comments   Cueing Techniques: Verbal cues;Gestural cues;Visual cues              Home Living Family/patient expects to be discharged to:: Private residence Living Arrangements: Children Available Help at Discharge: Family Type of Home: House Home Access: Ramped  entrance     Home Layout: Two level;Able to live on main level with bedroom/bathroom               Home Equipment: Rolling Walker (2 wheels);Wheelchair - manual          Prior Functioning/Environment Prior Level of Function : Independent/Modified Independent;History of Falls (last six months)             Mobility Comments: uses RW for mobility; hx of falls      OT Problem List: Decreased strength;Decreased range of motion;Decreased activity tolerance;Impaired balance (sitting and/or standing)   OT Treatment/Interventions: Self-care/ADL training;Therapeutic exercise;DME and/or AE instruction;Energy conservation;Therapeutic activities      OT Goals(Current goals can be found in the care plan section)   Acute Rehab OT Goals Patient Stated Goal: to go home OT Goal Formulation: With patient Time For Goal Achievement: 07/18/23 Potential to Achieve Goals: Good ADL Goals Pt Will Perform Grooming: with modified independence;standing Pt Will Perform Lower Body Dressing: with modified independence;sit to/from stand Pt Will Transfer to Toilet: with modified independence;ambulating;regular height toilet   OT Frequency:  Min 2X/week    Co-evaluation PT/OT/SLP Co-Evaluation/Treatment: Yes Reason for Co-Treatment: Necessary to address cognition/behavior during functional activity;For patient/therapist safety;To address functional/ADL transfers PT goals addressed during session: Mobility/safety with mobility;Balance OT goals addressed during session: ADL's and self-care      AM-PAC OT "6 Clicks" Daily Activity     Outcome Measure Help from another person eating meals?: None Help from another person taking care of personal grooming?: A Little Help from another person toileting, which includes using toliet, bedpan, or urinal?: A Little Help from another person bathing (including washing, rinsing, drying)?: A Lot Help from another person to put on and taking off regular  upper body clothing?: A Little Help from another person to put on and taking off regular lower body clothing?: A Lot 6 Click Score: 17   End of Session Equipment Utilized During Treatment: Rolling walker (2 wheels)  Activity Tolerance: Patient tolerated treatment well Patient left: in bed;with call bell/phone within reach  OT Visit Diagnosis: Unsteadiness on feet (R26.81);Other abnormalities of gait and mobility (R26.89)                Time: 1478-2956 OT Time Calculation (min): 18 min Charges:  OT General Charges $OT Visit: 1 Visit OT Evaluation $OT Eval Moderate Complexity: 1 Mod  Kathie Dike, M.S. OTR/L  07/04/23, 4:08 PM  ascom [phone]

## 2023-07-04 NOTE — Progress Notes (Signed)
 ANTICOAGULATION CONSULT NOTE  Pharmacy Consult for heparin infusion Indication: ACS / STEMI  Allergies  Allergen Reactions   Codeine Nausea And Vomiting    "Makes me deathly sick"    Patient Measurements: Height: 5\' 3"  (160 cm) Weight: 68 kg (150 lb) IBW/kg (Calculated) : 52.4 Heparin Dosing Weight: 66.3 kg  Vital Signs: Temp: 98.1 F (36.7 C) (03/21 1218) Temp Source: Oral (03/21 1218) BP: 123/50 (03/21 1223) Pulse Rate: 59 (03/21 1223)  Labs: Recent Labs    07/03/23 1727 07/03/23 2045 07/04/23 0100 07/04/23 0402 07/04/23 0746 07/04/23 1109  HGB 11.3*  --   --  9.1*  --   --   HCT 34.7*  --   --  27.6*  --   --   PLT 194  --   --  156  --   --   APTT  --   --  30  --   --   --   LABPROT 15.3*  --   --   --   --   --   INR 1.2  --   --   --   --   --   HEPARINUNFRC  --   --   --   --   --  0.45  CREATININE 2.26*  --   --  2.01*  --   --   CKTOTAL 510*  --   --  656*  --   --   TROPONINIHS 92*   < > 153* 167* 176*  --    < > = values in this interval not displayed.    Estimated Creatinine Clearance: 19.6 mL/min (A) (by C-G formula based on SCr of 2.01 mg/dL (H)).   Medical History: Past Medical History:  Diagnosis Date   Arthritis    "hands, feet, knees, shoulders" (05/05/2014)   Bipolar disorder (HCC)    Cataracts, bilateral    immature   Chronic kidney disease    "dr says they aren't functioning like they should" (05/05/2014)   Coronary artery disease    Depression    takes Zoloft daily   History of bronchitis 46yrs ago   Hyperlipidemia    takes Atorvastatin daily   Hypertension    takes Metoprolol,Lisinopril,and Apresoline daily   Joint pain    Peripheral edema    takes Chlothalidone daily   Squamous cell cancer of skin of nose 2012   "right"   Squamous cell cancer of skin of nose    Type II diabetes mellitus (HCC)    takes Tradjenta daily    Assessment: Pt is a 83 yo female presenting to ED  with fall, chest pain, burning on urination,  found with elevated  BNP, CK, & Troponin I level.  Goal of Therapy:  Heparin level 0.3-0.7 units/ml Monitor platelets by anticoagulation protocol: Yes   Plan:  Heparin level therapeutic x 2 Continue heparin infusion at 850 units/hr Will check next heparin level in am 03/22 CBC daily while on heparin  Burnis Medin, PharmD, BCPS Clinical Pharmacist 07/04/2023 2:54 PM

## 2023-07-04 NOTE — Progress Notes (Signed)
 ANTICOAGULATION CONSULT NOTE  Pharmacy Consult for heparin infusion Indication: ACS / STEMI  Allergies  Allergen Reactions   Codeine Nausea And Vomiting    "Makes me deathly sick"    Patient Measurements: Height: 5\' 3"  (160 cm) Weight: 68 kg (150 lb) IBW/kg (Calculated) : 52.4 Heparin Dosing Weight: 66.3 kg  Vital Signs: Temp: 98.1 F (36.7 C) (03/21 0744) Temp Source: Axillary (03/21 0744) BP: 117/47 (03/21 0730) Pulse Rate: 65 (03/21 0730)  Labs: Recent Labs    07/03/23 1727 07/03/23 2045 07/04/23 0100 07/04/23 0402 07/04/23 0746 07/04/23 1109  HGB 11.3*  --   --  9.1*  --   --   HCT 34.7*  --   --  27.6*  --   --   PLT 194  --   --  156  --   --   APTT  --   --  30  --   --   --   LABPROT 15.3*  --   --   --   --   --   INR 1.2  --   --   --   --   --   HEPARINUNFRC  --   --   --   --   --  0.45  CREATININE 2.26*  --   --  2.01*  --   --   CKTOTAL 510*  --   --  656*  --   --   TROPONINIHS 92*   < > 153* 167* 176*  --    < > = values in this interval not displayed.    Estimated Creatinine Clearance: 19.6 mL/min (A) (by C-G formula based on SCr of 2.01 mg/dL (H)).   Medical History: Past Medical History:  Diagnosis Date   Arthritis    "hands, feet, knees, shoulders" (05/05/2014)   Bipolar disorder (HCC)    Cataracts, bilateral    immature   Chronic kidney disease    "dr says they aren't functioning like they should" (05/05/2014)   Coronary artery disease    Depression    takes Zoloft daily   History of bronchitis 25yrs ago   Hyperlipidemia    takes Atorvastatin daily   Hypertension    takes Metoprolol,Lisinopril,and Apresoline daily   Joint pain    Peripheral edema    takes Chlothalidone daily   Squamous cell cancer of skin of nose 2012   "right"   Squamous cell cancer of skin of nose    Type II diabetes mellitus (HCC)    takes Tradjenta daily    Assessment: Pt is a 83 yo female presenting to ED  with fall, chest pain, burning on  urination, found with elevated  BNP, CK, & Troponin I level.  Date/Time HL Rate  Comment 3/21 1109 0.45 850 units/hr Therapeutic x 1  Goal of Therapy:  Heparin level 0.3-0.7 units/ml Monitor platelets by anticoagulation protocol: Yes   Plan:  Heparin level therapeutic x 1 Continue heparin infusion at 850 units/hr Will check confirmatory HL in 8 hr  CBC daily while on heparin  Paulita Fujita, PharmD Clinical Pharmacist 07/04/2023 11:29 AM

## 2023-07-04 NOTE — Progress Notes (Signed)
 ANTICOAGULATION CONSULT NOTE  Pharmacy Consult for heparin infusion Indication: ACS / STEMI  Allergies  Allergen Reactions   Codeine Nausea And Vomiting    "Makes me deathly sick"    Patient Measurements: Height: 5\' 3"  (160 cm) Weight: 68 kg (150 lb) IBW/kg (Calculated) : 52.4 Heparin Dosing Weight: 66.3 kg  Vital Signs: Temp: 98.8 F (37.1 C) (03/20 2344) Temp Source: Oral (03/20 2344) BP: 110/62 (03/20 2030) Pulse Rate: 96 (03/20 2245)  Labs: Recent Labs    07/03/23 1727 07/03/23 2045 07/03/23 2145 07/04/23 0100  HGB 11.3*  --   --   --   HCT 34.7*  --   --   --   PLT 194  --   --   --   APTT  --   --   --  30  LABPROT 15.3*  --   --   --   INR 1.2  --   --   --   CREATININE 2.26*  --   --   --   CKTOTAL 510*  --   --   --   TROPONINIHS 92* 123* 123*  --     Estimated Creatinine Clearance: 17.4 mL/min (A) (by C-G formula based on SCr of 2.26 mg/dL (H)).   Medical History: Past Medical History:  Diagnosis Date   Arthritis    "hands, feet, knees, shoulders" (05/05/2014)   Bipolar disorder (HCC)    Cataracts, bilateral    immature   Chronic kidney disease    "dr says they aren't functioning like they should" (05/05/2014)   Coronary artery disease    Depression    takes Zoloft daily   History of bronchitis 29yrs ago   Hyperlipidemia    takes Atorvastatin daily   Hypertension    takes Metoprolol,Lisinopril,and Apresoline daily   Joint pain    Peripheral edema    takes Chlothalidone daily   Squamous cell cancer of skin of nose 2012   "right"   Squamous cell cancer of skin of nose    Type II diabetes mellitus (HCC)    takes Tradjenta daily    Assessment: Pt is a 83 yo female presenting to ED  with fall, chest pain, burning on urination, found with elevated  BNP, CK, & Troponin I level.  Goal of Therapy:  Heparin level 0.3-0.7 units/ml Monitor platelets by anticoagulation protocol: Yes   Plan:  Pt recently given heparin 5000 units Sub-Q prior  to consult Start heparin infusion at 850 units/hr Will check HL in 8 hr after start of infusion CBC daily while on heparin  Otelia Sergeant, PharmD, [facility] 07/04/2023 1:22 AM

## 2023-07-05 ENCOUNTER — Inpatient Hospital Stay
Admit: 2023-07-05 | Discharge: 2023-07-05 | Disposition: A | Discharge status: Home / Self Care | Attending: Internal Medicine

## 2023-07-05 DIAGNOSIS — R079 Chest pain, unspecified: Secondary | ICD-10-CM | POA: Diagnosis not present

## 2023-07-05 DIAGNOSIS — N3000 Acute cystitis without hematuria: Secondary | ICD-10-CM | POA: Diagnosis not present

## 2023-07-05 DIAGNOSIS — R55 Syncope and collapse: Secondary | ICD-10-CM | POA: Diagnosis not present

## 2023-07-05 DIAGNOSIS — I214 Non-ST elevation (NSTEMI) myocardial infarction: Secondary | ICD-10-CM | POA: Diagnosis not present

## 2023-07-05 DIAGNOSIS — W19XXXA Unspecified fall, initial encounter: Secondary | ICD-10-CM | POA: Diagnosis not present

## 2023-07-05 DIAGNOSIS — R9431 Abnormal electrocardiogram [ECG] [EKG]: Secondary | ICD-10-CM | POA: Diagnosis not present

## 2023-07-05 DIAGNOSIS — R000 Tachycardia, unspecified: Secondary | ICD-10-CM | POA: Diagnosis not present

## 2023-07-05 DIAGNOSIS — M6282 Rhabdomyolysis: Secondary | ICD-10-CM

## 2023-07-05 LAB — ECHOCARDIOGRAM COMPLETE
AR max vel: 1.79 cm2
AV Peak grad: 7.1 mmHg
Ao pk vel: 1.33 m/s
Area-P 1/2: 2.59 cm2
Height: 63 in
S' Lateral: 3.2 cm
Weight: 2316.8 [oz_av]

## 2023-07-05 LAB — URINE CULTURE

## 2023-07-05 LAB — BASIC METABOLIC PANEL
Anion gap: 8 (ref 5–15)
BUN: 33 mg/dL — ABNORMAL HIGH (ref 8–23)
CO2: 20 mmol/L — ABNORMAL LOW (ref 22–32)
Calcium: 9.9 mg/dL (ref 8.9–10.3)
Chloride: 113 mmol/L — ABNORMAL HIGH (ref 98–111)
Creatinine, Ser: 1.8 mg/dL — ABNORMAL HIGH (ref 0.44–1.00)
GFR, Estimated: 28 mL/min — ABNORMAL LOW (ref >60)
Glucose, Bld: 77 mg/dL (ref 70–99)
Potassium: 3.9 mmol/L (ref 3.5–5.1)
Sodium: 141 mmol/L (ref 135–145)

## 2023-07-05 LAB — MAGNESIUM: Magnesium: 2.5 mg/dL — ABNORMAL HIGH (ref 1.7–2.4)

## 2023-07-05 LAB — CBC
HCT: 27.3 % — ABNORMAL LOW (ref 36.0–46.0)
Hemoglobin: 9 g/dL — ABNORMAL LOW (ref 12.0–15.0)
MCH: 29.8 pg (ref 26.0–34.0)
MCHC: 33 g/dL (ref 30.0–36.0)
MCV: 90.4 fL (ref 80.0–100.0)
Platelets: 129 10*3/uL — ABNORMAL LOW (ref 150–400)
RBC: 3.02 MIL/uL — ABNORMAL LOW (ref 3.87–5.11)
RDW: 14.7 % (ref 11.5–15.5)
WBC: 8.8 10*3/uL (ref 4.0–10.5)
nRBC: 0 % (ref 0.0–0.2)

## 2023-07-05 LAB — HEPARIN LEVEL (UNFRACTIONATED): Heparin Unfractionated: 0.46 [IU]/mL (ref 0.30–0.70)

## 2023-07-05 NOTE — Plan of Care (Signed)

## 2023-07-05 NOTE — Progress Notes (Signed)
  Echocardiogram 2D Echocardiogram has been performed.  [name] 07/05/2023, 2:10 PM

## 2023-07-05 NOTE — Progress Notes (Signed)
 ANTICOAGULATION CONSULT NOTE  Pharmacy Consult for heparin infusion Indication: ACS / STEMI  Allergies  Allergen Reactions   Codeine Nausea And Vomiting    "Makes me deathly sick"    Patient Measurements: Height: 5\' 3"  (160 cm) Weight: 65.7 kg (144 lb 12.8 oz) IBW/kg (Calculated) : 52.4 Heparin Dosing Weight: 66.3 kg  Vital Signs: Temp: 97.9 F (36.6 C) (03/22 0410) Temp Source: Oral (03/22 0410) BP: 128/50 (03/22 0410) Pulse Rate: 52 (03/22 0410)  Labs: Recent Labs    07/03/23 1727 07/03/23 2045 07/04/23 0100 07/04/23 0402 07/04/23 0746 07/04/23 1109 07/04/23 2000 07/05/23 0550  HGB 11.3*  --   --  9.1*  --   --   --  9.0*  HCT 34.7*  --   --  27.6*  --   --   --  27.3*  PLT 194  --   --  156  --   --   --  129*  APTT  --   --  30  --   --   --   --   --   LABPROT 15.3*  --   --   --   --   --   --   --   INR 1.2  --   --   --   --   --   --   --   HEPARINUNFRC  --   --   --   --   --  0.45 0.41 0.46  CREATININE 2.26*  --   --  2.01*  --   --   --   --   CKTOTAL 510*  --   --  656*  --   --   --   --   TROPONINIHS 92*   < > 153* 167* 176*  --   --   --    < > = values in this interval not displayed.    Estimated Creatinine Clearance: 19.3 mL/min (A) (by C-G formula based on SCr of 2.01 mg/dL (H)).   Medical History: Past Medical History:  Diagnosis Date   Arthritis    "hands, feet, knees, shoulders" (05/05/2014)   Bipolar disorder (HCC)    Cataracts, bilateral    immature   Chronic kidney disease    "dr says they aren't functioning like they should" (05/05/2014)   Coronary artery disease    Depression    takes Zoloft daily   History of bronchitis 35yrs ago   Hyperlipidemia    takes Atorvastatin daily   Hypertension    takes Metoprolol,Lisinopril,and Apresoline daily   Joint pain    Peripheral edema    takes Chlothalidone daily   Squamous cell cancer of skin of nose 2012   "right"   Squamous cell cancer of skin of nose    Type II diabetes  mellitus (HCC)    takes Tradjenta daily    Assessment: Pt is a 83 yo female presenting to ED  with fall, chest pain, burning on urination, found with elevated  BNP, CK, & Troponin I level.  Goal of Therapy:  Heparin level 0.3-0.7 units/ml Monitor platelets by anticoagulation protocol: Yes   Plan:  Heparin level therapeutic x 3 Continue heparin infusion at 850 units/hr Will check next heparin level in am 03/23 CBC daily while on heparin  Otelia Sergeant, PharmD, [facility] 07/05/2023 6:59 AM

## 2023-07-05 NOTE — Progress Notes (Signed)
 PROGRESS NOTE    [name]  ZOX:[id number] DOB: [birth year] DOA: 07/03/2023 PCP: Sherron Monday, MD  [id number]  LOS: 2 days   Brief hospital course:  [name] is a 83 y.o. female with medical history significant of HTN, HLD, DM, dCHF, depression with anxiety, bipolar, CKD-3B, skin cancer, who presented with fall, chest pain, burning on urination   Per report, bystander saw patient laying in yard and called 911.  Patient reports slipping and falling backwards with her walker. She injured her back and right lower leg. She has middle back pain and right lower leg pain.  She states that she has right chest pain.   3/22: No chest pain.  Will complete 48 hours of heparin.  PT is recommending SNF.  Improving renal function..  Assessment & Plan:   Trop elevation 2/2 demand ischemia  NSTEMI, ruled out --cardio consulted with Dr. Welton Flakes.  No need for coronary intervention at this time. --cont heparin for 48 hours-will complete today.  hx of CAD:  --cont ASA and statin   UTI (urinary tract infection):  Patient has WBC 17.0, but no fever.  Lactic acid is normal 1.0.  Clinically does not seem to have sepsis. --cont ceftriaxone , urine cultures with multiple species.   Type I second-degree AV block  --per cardio, hold off beta-blocker --d/c home coreg  Acute renal failure superimposed on stage 3b chronic kidney disease (HCC).  Creatinine seems improving and now close to baseline. -Hold home diuretics, lisinopril-will restart if needed --cont MIVF@50    Fall at home, initial encounter -Fall precaution -PT/OT   T12 compression fracture [facility]): -Mild superior endplate compression fracture of T12, age indeterminate. --Pain control, as needed Percocet, Tylenol -Lidoderm -Robaxin   Essential hypertension:  Patient is not very sure what medication she is taking. --d/c coreg due to AV block   Hyperlipidemia -Crestor   Type II diabetes mellitus with renal  manifestations (HCC):  Recent A1c 5.6, well-controlled.  Patient is taking Actos --d/c BG checks and SSI, no need.   Chronic diastolic CHF (congestive heart failure) (HCC):  2D echo 05/02/2014 showed EF of 55 to 60% with grade 2 diastolic dysfunction.  Patient does not have leg edema or JVD.  No oxygen desaturation.  Does not seem to have CHF exacerbation. -Check BNP --> 461 -hold diuretics due to worsening renal function and rhabdomyolysis.   Elevated CK and mild rhabdomyolysis:  CK 510 --cont MIVF   Depression -Zoloft   DVT prophylaxis: [id number]:[id number] gtt Code Status: Full code  Family Communication:  Level of care: Telemetry Cardiac Dispo:   The patient is from: home Anticipated d/c is to: SNF rehab Anticipated d/c date is: Medically stable in 1 day   Subjective and Interval History:  Patient was seen and examined today.  Just saying okay for all the questions.  Denies any chest pain.   Objective: Vitals:   07/05/23 0410 07/05/23 0540 07/05/23 0829 07/05/23 1213  BP: (!) 128/50  (!) 150/55 (!) 129/45  Pulse: (!) 52  (!) 59 (!) 54  Resp: 16  18 18   Temp: 97.9 F (36.6 C)  98.1 F (36.7 C) (!) 97.5 F (36.4 C)  TempSrc: Oral     SpO2: 94%  94% 92%  Weight:  65.7 kg    Height:        Intake/Output Summary (Last 24 hours) at 07/05/2023 1354 Last data filed at 07/05/2023 1045 Gross per 24 hour  Intake 1635.72 ml  Output 1 ml  Net 1634.72 ml   Filed Weights   07/03/23 1659 07/05/23 0540  Weight: 68 kg 65.7 kg    Examination:   General.  Frail elderly lady, in no acute distress. Pulmonary.  Lungs clear bilaterally, normal respiratory effort. CV.  Regular rate and rhythm, no JVD, rub or murmur. Abdomen.  Soft, nontender, nondistended, BS positive. CNS.  Alert and oriented to name.  No focal neurologic deficit. Extremities.  No edema, no cyanosis, pulses intact and symmetrical.  Data Reviewed: I have personally reviewed labs and imaging studies  Time spent: 50  minutes  Arnetha Courser, MD [facility] If 7PM-7AM, please contact night-coverage 07/05/2023, 1:54 PM

## 2023-07-05 NOTE — Progress Notes (Signed)
 SUBJECTIVE: Patient is still confused but denies any chest pain and shortness of breath.   Vitals:   07/04/23 2349 07/05/23 0410 07/05/23 0540 07/05/23 0829  BP: (!) 107/50 (!) 128/50  (!) 150/55  Pulse: 60 (!) 52  (!) 59  Resp: 15 16  18   Temp: 97.9 F (36.6 C) 97.9 F (36.6 C)  98.1 F (36.7 C)  TempSrc: Axillary Oral    SpO2: 93% 94%  94%  Weight:   65.7 kg   Height:        Intake/Output Summary (Last 24 hours) at 07/05/2023 1126 Last data filed at 07/05/2023 1045 Gross per 24 hour  Intake 1635.72 ml  Output 1 ml  Net 1634.72 ml    LABS: Basic Metabolic Panel: Recent Labs    07/04/23 0402 07/05/23 0550  NA 143 141  K 3.1* 3.9  CL 110 113*  CO2 18* 20*  GLUCOSE 84 77  BUN 41* 33*  CREATININE 2.01* 1.80*  CALCIUM 10.0 9.9  MG 1.2* 2.5*   Liver Function Tests: Recent Labs    07/03/23 1727  AST 45*  ALT 19  ALKPHOS 63  BILITOT 1.7*  PROT 7.2  ALBUMIN 4.5   No results for input(s): "LIPASE", "AMYLASE" in the last 72 hours. CBC: Recent Labs    07/04/23 0402 07/05/23 0550  WBC 15.6* 8.8  HGB 9.1* 9.0*  HCT 27.6* 27.3*  MCV 92.3 90.4  PLT 156 129*   Cardiac Enzymes: Recent Labs    07/03/23 1727 07/04/23 0402  CKTOTAL 510* 656*   BNP: Invalid input(s): "POCBNP" D-Dimer: No results for input(s): "DDIMER" in the last 72 hours. Hemoglobin A1C: Recent Labs    07/04/23 0100  HGBA1C 5.1   Fasting Lipid Panel: Recent Labs    07/04/23 0402  CHOL 106  HDL 44  LDLCALC 51  TRIG 55  CHOLHDL 2.4   Thyroid Function Tests: No results for input(s): "TSH", "T4TOTAL", "T3FREE", "THYROIDAB" in the last 72 hours.  Invalid input(s): "FREET3" Anemia Panel: No results for input(s): "VITAMINB12", "FOLATE", "FERRITIN", "TIBC", "IRON", "RETICCTPCT" in the last 72 hours.   PHYSICAL EXAM General: Well developed, well nourished, in no acute distress HEENT:  Normocephalic and atramatic Neck:  No JVD.  Lungs: Clear bilaterally to auscultation and  percussion. Heart: HRRR . Normal S1 and S2 without gallops or murmurs.  Abdomen: Bowel sounds are positive, abdomen soft and non-tender  Msk:  Back normal, normal gait. Normal strength and tone for age. Extremities: No clubbing, cyanosis or edema.   Neuro: Alert and oriented X 3. Psych:  Good affect, responds appropriately  TELEMETRY: Sinus rhythm  ASSESSMENT AND PLAN:    ICD-10-CM   1. Fall, initial encounter  W19.XXXA     2. Compression fracture of T12 vertebra, initial encounter (HCC)  S22.080A     3. Elevated troponin  R79.89       Principal Problem:   NSTEMI (non-ST elevated myocardial infarction) [facility]) Active Problems:   Essential hypertension   Hyperlipidemia   Fall at home, initial encounter   T12 compression fracture (HCC)   Tachycardia   UTI (urinary tract infection)   CAD (coronary artery disease)   Type II diabetes mellitus with renal manifestations (HCC)   Depression   Chronic diastolic CHF (congestive heart failure) (HCC)   Elevated CK   Acute renal failure superimposed on stage 3b chronic kidney disease (HCC)   Rhabdomyolysis     #1 syncopal episodes with significant fall leading to fracture of T12, with  history of coronary artery disease hypertension and hyperlipidemia and CHF.  It appears patient has mildly elevated troponin due to rhabdomyolysis with creatinine gradually improving from 2.0-1.8 now.  Unlikely this is due to non-STEMI no significant troponin elevation.  EKG has type I second-degree AV block and will repeat the EKG today.  Patient appears to be gradually improving.  No indication for coronary intervention. #2 renal insufficiency most likely due to rhabdomyolysis.  Advise IV fluid #3 elevated troponin not significant and most likely due to rhabdomyolysis from the fall.  May stop heparin tomorrow morning.  Thank you very much for referral.  Adrian Blackwater, MD, [facility] 07/05/2023 11:26 AM

## 2023-07-06 DIAGNOSIS — F32A Depression, unspecified: Secondary | ICD-10-CM

## 2023-07-06 DIAGNOSIS — R55 Syncope and collapse: Secondary | ICD-10-CM | POA: Diagnosis not present

## 2023-07-06 DIAGNOSIS — R079 Chest pain, unspecified: Secondary | ICD-10-CM | POA: Diagnosis not present

## 2023-07-06 DIAGNOSIS — N3000 Acute cystitis without hematuria: Secondary | ICD-10-CM | POA: Diagnosis not present

## 2023-07-06 DIAGNOSIS — I214 Non-ST elevation (NSTEMI) myocardial infarction: Secondary | ICD-10-CM | POA: Diagnosis not present

## 2023-07-06 DIAGNOSIS — W19XXXA Unspecified fall, initial encounter: Secondary | ICD-10-CM | POA: Diagnosis not present

## 2023-07-06 DIAGNOSIS — R9431 Abnormal electrocardiogram [ECG] [EKG]: Secondary | ICD-10-CM | POA: Diagnosis not present

## 2023-07-06 DIAGNOSIS — S22080A Wedge compression fracture of T11-T12 vertebra, initial encounter for closed fracture: Secondary | ICD-10-CM | POA: Diagnosis not present

## 2023-07-06 LAB — CBC
HCT: 27.5 % — ABNORMAL LOW (ref 36.0–46.0)
Hemoglobin: 9.3 g/dL — ABNORMAL LOW (ref 12.0–15.0)
MCH: 30.4 pg (ref 26.0–34.0)
MCHC: 33.8 g/dL (ref 30.0–36.0)
MCV: 89.9 fL (ref 80.0–100.0)
Platelets: 120 10*3/uL — ABNORMAL LOW (ref 150–400)
RBC: 3.06 MIL/uL — ABNORMAL LOW (ref 3.87–5.11)
RDW: 14.4 % (ref 11.5–15.5)
WBC: 7.5 10*3/uL (ref 4.0–10.5)
nRBC: 0 % (ref 0.0–0.2)

## 2023-07-06 LAB — BASIC METABOLIC PANEL
Anion gap: 5 (ref 5–15)
BUN: 29 mg/dL — ABNORMAL HIGH (ref 8–23)
CO2: 23 mmol/L (ref 22–32)
Calcium: 9.6 mg/dL (ref 8.9–10.3)
Chloride: 112 mmol/L — ABNORMAL HIGH (ref 98–111)
Creatinine, Ser: 1.53 mg/dL — ABNORMAL HIGH (ref 0.44–1.00)
GFR, Estimated: 34 mL/min — ABNORMAL LOW (ref >60)
Glucose, Bld: 104 mg/dL — ABNORMAL HIGH (ref 70–99)
Potassium: 3.8 mmol/L (ref 3.5–5.1)
Sodium: 140 mmol/L (ref 135–145)

## 2023-07-06 LAB — HEPARIN LEVEL (UNFRACTIONATED): Heparin Unfractionated: 0.37 [IU]/mL (ref 0.30–0.70)

## 2023-07-06 LAB — MAGNESIUM: Magnesium: 1.9 mg/dL (ref 1.7–2.4)

## 2023-07-06 LAB — CK: Total CK: 244 U/L — ABNORMAL HIGH (ref 38–234)

## 2023-07-06 MED ORDER — ENOXAPARIN SODIUM 30 MG/0.3ML IJ SOSY
30.0000 mg | PREFILLED_SYRINGE | INTRAMUSCULAR | Status: DC
  Administered 2023-07-06 – 2023-07-08 (×3): 30 mg via SUBCUTANEOUS
  Filled 2023-07-06 (×2): qty 0.3

## 2023-07-06 MED ORDER — ENSURE ENLIVE PO LIQD
237.0000 mL | Freq: Two times a day (BID) | ORAL | Status: DC
  Administered 2023-07-06 – 2023-07-08 (×4): 237 mL via ORAL

## 2023-07-06 NOTE — Progress Notes (Signed)
 ANTICOAGULATION CONSULT NOTE  Pharmacy Consult for heparin infusion Indication: ACS / STEMI  Allergies  Allergen Reactions   Codeine Nausea And Vomiting    "Makes me deathly sick"    Patient Measurements: Height: 5\' 3"  (160 cm) Weight: 68.8 kg (151 lb 11.2 oz) IBW/kg (Calculated) : 52.4 Heparin Dosing Weight: 66.3 kg  Vital Signs: Temp: 98.1 F (36.7 C) (03/23 0354) Temp Source: Oral (03/23 0354) BP: 126/54 (03/23 0354) Pulse Rate: 62 (03/23 0354)  Labs: Recent Labs    07/03/23 1727 07/03/23 2045 07/04/23 0100 07/04/23 0402 07/04/23 0746 07/04/23 1109 07/04/23 2000 07/05/23 0550 07/06/23 0507  HGB 11.3*  --   --  9.1*  --   --   --  9.0* 9.3*  HCT 34.7*  --   --  27.6*  --   --   --  27.3* 27.5*  PLT 194  --   --  156  --   --   --  129* 120*  APTT  --   --  30  --   --   --   --   --   --   LABPROT 15.3*  --   --   --   --   --   --   --   --   INR 1.2  --   --   --   --   --   --   --   --   HEPARINUNFRC  --   --   --   --   --    < > 0.41 0.46 0.37  CREATININE 2.26*  --   --  2.01*  --   --   --  1.80* 1.53*  CKTOTAL 510*  --   --  656*  --   --   --   --  244*  TROPONINIHS 92*   < > 153* 167* 176*  --   --   --   --    < > = values in this interval not displayed.    Estimated Creatinine Clearance: 25.9 mL/min (A) (by C-G formula based on SCr of 1.53 mg/dL (H)).   Medical History: Past Medical History:  Diagnosis Date   Arthritis    "hands, feet, knees, shoulders" (05/05/2014)   Bipolar disorder (HCC)    Cataracts, bilateral    immature   Chronic kidney disease    "dr says they aren't functioning like they should" (05/05/2014)   Coronary artery disease    Depression    takes Zoloft daily   History of bronchitis 83yrs ago   Hyperlipidemia    takes Atorvastatin daily   Hypertension    takes Metoprolol,Lisinopril,and Apresoline daily   Joint pain    Peripheral edema    takes Chlothalidone daily   Squamous cell cancer of skin of nose 2012    "right"   Squamous cell cancer of skin of nose    Type II diabetes mellitus (HCC)    takes Tradjenta daily    Assessment: Pt is a 83 yo female presenting to ED  with fall, chest pain, burning on urination, found with elevated  BNP, CK, & Troponin I level.  Goal of Therapy:  Heparin level 0.3-0.7 units/ml Monitor platelets by anticoagulation protocol: Yes   Plan:  Heparin level therapeutic x 4 Continue heparin infusion at 850 units/hr Will check next heparin level in am 03/24 CBC daily while on heparin  Otelia Sergeant, PharmD, [facility] 07/06/2023 6:04 AM

## 2023-07-06 NOTE — Plan of Care (Signed)

## 2023-07-06 NOTE — Progress Notes (Signed)
 [facility] Liaison Note  Patient's son had contacted our agency prior to hospitalization seeking help/assistance from our organization.  I had a discussion with him on Friday 3.21.25 and he was agreeable to Palliative care consult pending discharge from [facility].    Communicated with Dr. Nelson Chimes, who is in agreement with palliative care consult post hospital discharge at [facility] facility.  Referral sent in to [facility] referral intake office.  Norris Cross, RN Nurse Liaison [phone]

## 2023-07-06 NOTE — Progress Notes (Signed)
 SUBJECTIVE: Patient is lying in bed comfortably and is less short of breath today.   Vitals:   07/05/23 2352 07/06/23 0354 07/06/23 0503 07/06/23 0732  BP: (!) 152/59 (!) 126/54  122/60  Pulse: (!) 56 62  (!) 58  Resp: 18 18  14   Temp: 98.1 F (36.7 C) 98.1 F (36.7 C)  (!) 97.5 F (36.4 C)  TempSrc: Oral Oral  Axillary  SpO2: 95% 99%  96%  Weight:   68.8 kg   Height:        Intake/Output Summary (Last 24 hours) at 07/06/2023 1207 Last data filed at 07/06/2023 1029 Gross per 24 hour  Intake 60 ml  Output 1850 ml  Net -1790 ml    LABS: Basic Metabolic Panel: Recent Labs    07/05/23 0550 07/06/23 0507  NA 141 140  K 3.9 3.8  CL 113* 112*  CO2 20* 23  GLUCOSE 77 104*  BUN 33* 29*  CREATININE 1.80* 1.53*  CALCIUM 9.9 9.6  MG 2.5* 1.9   Liver Function Tests: Recent Labs    07/03/23 1727  AST 45*  ALT 19  ALKPHOS 63  BILITOT 1.7*  PROT 7.2  ALBUMIN 4.5   No results for input(s): "LIPASE", "AMYLASE" in the last 72 hours. CBC: Recent Labs    07/05/23 0550 07/06/23 0507  WBC 8.8 7.5  HGB 9.0* 9.3*  HCT 27.3* 27.5*  MCV 90.4 89.9  PLT 129* 120*   Cardiac Enzymes: Recent Labs    07/03/23 1727 07/04/23 0402 07/06/23 0507  CKTOTAL 510* 656* 244*   BNP: Invalid input(s): "POCBNP" D-Dimer: No results for input(s): "DDIMER" in the last 72 hours. Hemoglobin A1C: Recent Labs    07/04/23 0100  HGBA1C 5.1   Fasting Lipid Panel: Recent Labs    07/04/23 0402  CHOL 106  HDL 44  LDLCALC 51  TRIG 55  CHOLHDL 2.4   Thyroid Function Tests: No results for input(s): "TSH", "T4TOTAL", "T3FREE", "THYROIDAB" in the last 72 hours.  Invalid input(s): "FREET3" Anemia Panel: No results for input(s): "VITAMINB12", "FOLATE", "FERRITIN", "TIBC", "IRON", "RETICCTPCT" in the last 72 hours.   PHYSICAL EXAM General: Well developed, well nourished, in no acute distress HEENT:  Normocephalic and atramatic Neck:  No JVD.  Lungs: Clear bilaterally to  auscultation and percussion. Heart: HRRR . Normal S1 and S2 without gallops or murmurs.  Abdomen: Bowel sounds are positive, abdomen soft and non-tender  Msk:  Back normal, normal gait. Normal strength and tone for age. Extremities: No clubbing, cyanosis or edema.   Neuro: Alert and oriented X 3. Psych:  Good affect, responds appropriately  TELEMETRY: Sinus rhythm, type I second-degree AV block  ASSESSMENT AND PLAN: #1 status post syncope and fall in the yard.  EKG has type I second-degree AV block.  Carvedilol is on hold. #2 mildly elevated troponin with atypical chest pain and peak troponin being only 176 probably due to demand ischemia.  She is not a candidate for coronary intervention. #3 worsening renal insufficiency with creatinine being 2.26 on admission is gradually getting better with IV fluid and cessation of Lasix. #3 HFpEF with echocardiogram confirming grade 1 diastolic dysfunction and normal LVEF.  Currently patient is compensated and may have rhabdomyolysis from the fall.  Agree with holding Lasix. #4 status post T12 compression fracture.  Patient is getting pain control with Percocet.   ICD-10-CM   1. Fall, initial encounter  W19.XXXA     2. Compression fracture of T12 vertebra, initial encounter (HCC)  S22.080A     3. Elevated troponin  R79.89       Principal Problem:   NSTEMI (non-ST elevated myocardial infarction) [facility]) Active Problems:   Essential hypertension   Hyperlipidemia   Fall at home, initial encounter   T12 compression fracture (HCC)   Tachycardia   UTI (urinary tract infection)   CAD (coronary artery disease)   Type II diabetes mellitus with renal manifestations (HCC)   Depression   Chronic diastolic CHF (congestive heart failure) (HCC)   Elevated CK   Acute renal failure superimposed on stage 3b chronic kidney disease (HCC)   Rhabdomyolysis    Adrian Blackwater, MD, [facility] 07/06/2023 12:07 PM
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
## 2023-07-06 NOTE — Progress Notes (Signed)
 PROGRESS NOTE    [name]  WGN:[id number] DOB: [birth year] DOA: 07/03/2023 PCP: Sherron Monday, MD  253A/253A-AA  LOS: 3 days   Brief hospital course:  [name] is a 83 y.o. female with medical history significant of HTN, HLD, DM, dCHF, depression with anxiety, bipolar, CKD-3B, skin cancer, who presented with fall, chest pain, burning on urination   Per report, bystander saw patient laying in yard and called 911.  Patient reports slipping and falling backwards with her walker. She injured her back and right lower leg. She has middle back pain and right lower leg pain.  She states that she has right chest pain.   3/22: No chest pain.  Will complete 48 hours of heparin.  PT is recommending SNF.  Improving renal function.  3/23: Remained hemodynamically stable.  Completed 48 hours of heparin and it was discontinued.  Renal functions continue to improve. Pending SNF placement.   Assessment & Plan:   Trop elevation 2/2 demand ischemia  NSTEMI, ruled out --cardio consulted with Dr. Welton Flakes.  No need for coronary intervention at this time. -- Completed 48 hours of heparin.  hx of CAD:  --cont ASA and statin   UTI (urinary tract infection):  Patient has WBC 17.0, but no fever.  Lactic acid is normal 1.0.  Clinically does not seem to have sepsis. -- Discontinue ceftriaxone.   Type I second-degree AV block  --per cardio, hold off beta-blocker --d/c home coreg  Acute renal failure superimposed on stage 3b chronic kidney disease (HCC).  Creatinine seems improving and now close to baseline. -Hold home diuretics, lisinopril-will restart if needed   Fall at home, initial encounter -Fall precaution -PT/OT   T12 compression fracture [facility]): -Mild superior endplate compression fracture of T12, age indeterminate. --Pain control, as needed Percocet, Tylenol -Lidoderm -Robaxin   Essential hypertension:  Patient is not very sure what medication she is taking. --d/c coreg  due to AV block   Hyperlipidemia -Crestor   Type II diabetes mellitus with renal manifestations (HCC):  Recent A1c 5.6, well-controlled.  Patient is taking Actos --d/c BG checks and SSI, no need.   Chronic diastolic CHF (congestive heart failure) (HCC):  2D echo 05/02/2014 showed EF of 55 to 60% with grade 2 diastolic dysfunction.  Patient does not have leg edema or JVD.  No oxygen desaturation.  Does not seem to have CHF exacerbation. -Check BNP --> 461 -hold diuretics due to worsening renal function and rhabdomyolysis.  Patient clinically appears euvolemic.   Elevated CK and mild rhabdomyolysis:  CK improving. --Holding more IV fluid.  Depression -Zoloft   DVT prophylaxis: Lovenox Code Status: Full code  Family Communication:  Level of care: Telemetry Cardiac Dispo:   The patient is from: home Anticipated d/c is to: SNF rehab Anticipated d/c date is: Now medically stable, pending SNF placement will   Subjective and Interval History:  Patient with no chest pain or shortness of breath.  Son at bedside.  He was very concerned about worsening dementia.  Objective: Vitals:   07/05/23 2352 07/06/23 0354 07/06/23 0503 07/06/23 0732  BP: (!) 152/59 (!) 126/54  122/60  Pulse: (!) 56 62  (!) 58  Resp: 18 18  14   Temp: 98.1 F (36.7 C) 98.1 F (36.7 C)  (!) 97.5 F (36.4 C)  TempSrc: Oral Oral  Axillary  SpO2: 95% 99%  96%  Weight:   68.8 kg   Height:        Intake/Output Summary (Last 24  hours) at 07/06/2023 1352 Last data filed at 07/06/2023 1228 Gross per 24 hour  Intake 300 ml  Output 2150 ml  Net -1850 ml   Filed Weights   07/03/23 1659 07/05/23 0540 07/06/23 0503  Weight: 68 kg 65.7 kg 68.8 kg    Examination:   General.  Frail elderly lady, in no acute distress. Pulmonary.  Lungs clear bilaterally, normal respiratory effort. CV.  Regular rate and rhythm, no JVD, rub or murmur. Abdomen.  Soft, nontender, nondistended, BS positive. CNS.  Alert and oriented  .  No focal neurologic deficit. Extremities.  No edema, no cyanosis, pulses intact and symmetrical.   Data Reviewed: I have personally reviewed labs and imaging studies  Time spent: 45 minutes  This record has been created using Conservation officer, historic buildings. Errors have been sought and corrected,but may not always be located. Such creation errors do not reflect on the standard of care.   Arnetha Courser, MD [facility] If 7PM-7AM, please contact night-coverage 07/06/2023, 1:52 PM

## 2023-07-06 NOTE — Progress Notes (Signed)
 PHARMACIST - PHYSICIAN COMMUNICATION  CONCERNING:  Enoxaparin (Lovenox) for DVT Prophylaxis    RECOMMENDATION: Patient was prescribed enoxaprin  for VTE prophylaxis.   Filed Weights   07/03/23 1659 07/05/23 0540 07/06/23 0503  Weight: 68 kg (150 lb) 65.7 kg (144 lb 12.8 oz) 68.8 kg (151 lb 11.2 oz)    Body mass index is 26.87 kg/m.  Estimated Creatinine Clearance: 25.9 mL/min (A) (by C-G formula based on SCr of 1.53 mg/dL (H)).   Based on [facility] policy patient is candidate for enoxaparin 30mg  every 24 hours based on CrCl <32ml/min   DESCRIPTION: Pharmacy has adjusted enoxaparin dose per [facility] policy.  Patient is now receiving enoxaparin 30 mg every 24 hours    Lowella Bandy, PharmD Clinical Pharmacist  07/06/2023 1:59 PM
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
## 2023-07-07 DIAGNOSIS — W19XXXA Unspecified fall, initial encounter: Secondary | ICD-10-CM | POA: Diagnosis not present

## 2023-07-07 DIAGNOSIS — I214 Non-ST elevation (NSTEMI) myocardial infarction: Secondary | ICD-10-CM | POA: Diagnosis not present

## 2023-07-07 DIAGNOSIS — R55 Syncope and collapse: Secondary | ICD-10-CM | POA: Diagnosis not present

## 2023-07-07 DIAGNOSIS — S22080A Wedge compression fracture of T11-T12 vertebra, initial encounter for closed fracture: Secondary | ICD-10-CM | POA: Diagnosis not present

## 2023-07-07 DIAGNOSIS — N3000 Acute cystitis without hematuria: Secondary | ICD-10-CM | POA: Diagnosis not present

## 2023-07-07 DIAGNOSIS — R079 Chest pain, unspecified: Secondary | ICD-10-CM | POA: Diagnosis not present

## 2023-07-07 DIAGNOSIS — R9431 Abnormal electrocardiogram [ECG] [EKG]: Secondary | ICD-10-CM | POA: Diagnosis not present

## 2023-07-07 LAB — CBC
HCT: 28.7 % — ABNORMAL LOW (ref 36.0–46.0)
Hemoglobin: 9.7 g/dL — ABNORMAL LOW (ref 12.0–15.0)
MCH: 30.1 pg (ref 26.0–34.0)
MCHC: 33.8 g/dL (ref 30.0–36.0)
MCV: 89.1 fL (ref 80.0–100.0)
Platelets: 118 10*3/uL — ABNORMAL LOW (ref 150–400)
RBC: 3.22 MIL/uL — ABNORMAL LOW (ref 3.87–5.11)
RDW: 14 % (ref 11.5–15.5)
WBC: 7.3 10*3/uL (ref 4.0–10.5)
nRBC: 0 % (ref 0.0–0.2)

## 2023-07-07 LAB — BASIC METABOLIC PANEL
Anion gap: 6 (ref 5–15)
BUN: 33 mg/dL — ABNORMAL HIGH (ref 8–23)
CO2: 24 mmol/L (ref 22–32)
Calcium: 9.9 mg/dL (ref 8.9–10.3)
Chloride: 110 mmol/L (ref 98–111)
Creatinine, Ser: 1.6 mg/dL — ABNORMAL HIGH (ref 0.44–1.00)
GFR, Estimated: 32 mL/min — ABNORMAL LOW (ref >60)
Glucose, Bld: 93 mg/dL (ref 70–99)
Potassium: 4 mmol/L (ref 3.5–5.1)
Sodium: 140 mmol/L (ref 135–145)

## 2023-07-07 LAB — MAGNESIUM: Magnesium: 1.5 mg/dL — ABNORMAL LOW (ref 1.7–2.4)

## 2023-07-07 MED ORDER — POLYETHYLENE GLYCOL 3350 17 G PO PACK
17.0000 g | PACK | Freq: Every day | ORAL | Status: DC
  Administered 2023-07-07 – 2023-07-09 (×3): 17 g via ORAL
  Filled 2023-07-07 (×3): qty 1

## 2023-07-07 NOTE — Plan of Care (Signed)
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
## 2023-07-07 NOTE — Progress Notes (Signed)
 Physical Therapy Treatment Patient Details Name: [name] MRN: [id number] DOB: 12-21-1940 Today's Date: 07/07/2023   History of Present Illness 83 y/o female presented to ED on 07/03/23 for fall, chest pain, and burning with urination. Admitted for NSTEMI and UTI. PMH: HTN, DM, dCHF, depression, CKD-3b, skin cancer, bipolar    PT Comments  Patient semi reclined on arrival and agreeable to PT treatment session. Completed bed mobility and sit to stand with CGA. Ambulated 200' with CGA and RW, forward flexed trunk throughout. Continues to be limited by cognition and decreased command following. Continues to improve towards physical therapy goals. Discharge plan remains appropriate.     If plan is discharge home, recommend the following: A little help with walking and/or transfers;A little help with bathing/dressing/bathroom;Assistance with cooking/housework;Direct supervision/assist for medications management;Direct supervision/assist for financial management;Assist for transportation;Help with stairs or ramp for entrance;Supervision due to cognitive status   Can travel by private vehicle     Yes  Equipment Recommendations  Rolling Azyah Flett (2 wheels);BSC/3in1    Recommendations for Other Services       Precautions / Restrictions Precautions Precautions: Fall Recall of Precautions/Restrictions: Impaired Restrictions Weight Bearing Restrictions Per Provider Order: No     Mobility  Bed Mobility Overal bed mobility: Needs Assistance Bed Mobility: Supine to Sit     Supine to sit: Contact guard          Transfers Overall transfer level: Needs assistance Equipment used: Rolling Briceyda Abdullah (2 wheels) Transfers: Sit to/from Stand Sit to Stand: Contact guard assist                Ambulation/Gait Ambulation/Gait assistance: Contact guard assist Gait Distance (Feet): 200 Feet Assistive device: Rolling Jammy Stlouis (2 wheels) Gait Pattern/deviations: Step-through pattern,  Decreased stride length Gait velocity: decreased     General Gait Details: CGA for safety. No overt LOB   Stairs             Wheelchair Mobility     Tilt Bed    Modified Rankin (Stroke Patients Only)       Balance Overall balance assessment: Needs assistance Sitting-balance support: No upper extremity supported, Feet supported Sitting balance-Leahy Scale: Fair     Standing balance support: Bilateral upper extremity supported, Reliant on assistive device for balance Standing balance-Leahy Scale: Poor                              Communication Communication Communication: Impaired Factors Affecting Communication: Hearing impaired  Cognition Arousal: Alert Behavior During Therapy: WFL for tasks assessed/performed   PT - Cognitive impairments: No family/caregiver present to determine baseline                         Following commands: Intact      Cueing Cueing Techniques: Verbal cues  Exercises      General Comments        Pertinent Vitals/Pain Pain Assessment Pain Assessment: No/denies pain    Home Living                          Prior Function            PT Goals (current goals can now be found in the care plan section) Acute Rehab PT Goals Patient Stated Goal: did not state PT Goal Formulation: With patient Time For Goal Achievement: 07/18/23 Potential to Achieve Goals: Good Progress towards  PT goals: Progressing toward goals    Frequency    Min 2X/week      PT Plan      Co-evaluation              AM-PAC PT "6 Clicks" Mobility   Outcome Measure  Help needed turning from your back to your side while in a flat bed without using bedrails?: A Little Help needed moving from lying on your back to sitting on the side of a flat bed without using bedrails?: A Little Help needed moving to and from a bed to a chair (including a wheelchair)?: A Little Help needed standing up from a chair using your  arms (e.g., wheelchair or bedside chair)?: A Little Help needed to walk in hospital room?: A Little Help needed climbing 3-5 steps with a railing? : A Lot 6 Click Score: 17    End of Session   Activity Tolerance: Patient tolerated treatment well Patient left: in chair;with call bell/phone within reach;with chair alarm set Nurse Communication: Mobility status PT Visit Diagnosis: Unsteadiness on feet (R26.81);Muscle weakness (generalized) (M62.81);Difficulty in walking, not elsewhere classified (R26.2)     Time: 9147-8295 PT Time Calculation (min) (ACUTE ONLY): 17 min  Charges:    $Therapeutic Activity: 8-22 mins PT General Charges $$ ACUTE PT VISIT: 1 Visit                     Maylon Peppers, PT, DPT Physical Therapist - [facility]  [facility]    Jabrea Kallstrom A Tashari Schoenfelder 07/07/2023, 12:25 PM

## 2023-07-07 NOTE — Care Management Important Message (Signed)
 Important Message  Patient Details  Name: [name] MRN: [id number] Date of Birth: [birth year]   Important Message Given:  Yes - Medicare IM     Cristela Blue, CMA 07/07/2023, 12:54 PM

## 2023-07-08 ENCOUNTER — Encounter: Payer: Self-pay | Admitting: Internal Medicine

## 2023-07-08 DIAGNOSIS — R55 Syncope and collapse: Secondary | ICD-10-CM | POA: Diagnosis not present

## 2023-07-08 DIAGNOSIS — I214 Non-ST elevation (NSTEMI) myocardial infarction: Secondary | ICD-10-CM | POA: Diagnosis not present

## 2023-07-08 DIAGNOSIS — R079 Chest pain, unspecified: Secondary | ICD-10-CM | POA: Diagnosis not present

## 2023-07-08 DIAGNOSIS — S22080A Wedge compression fracture of T11-T12 vertebra, initial encounter for closed fracture: Secondary | ICD-10-CM | POA: Diagnosis not present

## 2023-07-08 DIAGNOSIS — W19XXXA Unspecified fall, initial encounter: Secondary | ICD-10-CM | POA: Diagnosis not present

## 2023-07-08 DIAGNOSIS — N3000 Acute cystitis without hematuria: Secondary | ICD-10-CM | POA: Diagnosis not present

## 2023-07-08 DIAGNOSIS — R9431 Abnormal electrocardiogram [ECG] [EKG]: Secondary | ICD-10-CM | POA: Diagnosis not present

## 2023-07-08 LAB — BASIC METABOLIC PANEL
Anion gap: 8 (ref 5–15)
BUN: 34 mg/dL — ABNORMAL HIGH (ref 8–23)
CO2: 26 mmol/L (ref 22–32)
Calcium: 10.1 mg/dL (ref 8.9–10.3)
Chloride: 106 mmol/L (ref 98–111)
Creatinine, Ser: 1.41 mg/dL — ABNORMAL HIGH (ref 0.44–1.00)
GFR, Estimated: 37 mL/min — ABNORMAL LOW (ref >60)
Glucose, Bld: 96 mg/dL (ref 70–99)
Potassium: 3.9 mmol/L (ref 3.5–5.1)
Sodium: 140 mmol/L (ref 135–145)

## 2023-07-08 LAB — MAGNESIUM: Magnesium: 1.5 mg/dL — ABNORMAL LOW (ref 1.7–2.4)

## 2023-07-08 MED ORDER — LISINOPRIL 10 MG PO TABS
10.0000 mg | ORAL_TABLET | Freq: Every day | ORAL | Status: DC
  Administered 2023-07-08 – 2023-07-09 (×2): 10 mg via ORAL
  Filled 2023-07-08 (×2): qty 1

## 2023-07-08 MED ORDER — MAGNESIUM SULFATE 4 GM/100ML IV SOLN
4.0000 g | Freq: Once | INTRAVENOUS | Status: AC
  Administered 2023-07-08: 4 g via INTRAVENOUS
  Filled 2023-07-08: qty 100

## 2023-07-08 NOTE — Plan of Care (Signed)

## 2023-07-08 NOTE — Progress Notes (Signed)
 PROGRESS NOTE    [name]  ZOX:[id number] DOB: Jan 17, 1941 DOA: 07/03/2023 PCP: Sherron Monday, MD  [id number]  LOS: 5 days   Brief hospital course:  [name] is a 83 y.o. female with medical history significant of HTN, HLD, DM, dCHF, depression with anxiety, bipolar, CKD-3B, skin cancer, who presented with fall, chest pain, burning on urination   Per report, bystander saw patient laying in yard and called 911.  Patient reports slipping and falling backwards with her walker. She injured her back and right lower leg. She has middle back pain and right lower leg pain.  She states that she has right chest pain.   3/22: No chest pain.  Will complete 48 hours of heparin.  PT is recommending SNF.  Improving renal function.  3/23: Remained hemodynamically stable.  Completed 48 hours of heparin and it was discontinued.  Renal functions continue to improve. Pending SNF placement.   3/24: Remains stable.  Pending placement  3/25: Replating magnesium due to hypomagnesemia.  Still awaiting placement  Assessment & Plan:   Trop elevation 2/2 demand ischemia  NSTEMI, ruled out --cardio consulted with Dr. Welton Flakes.  No need for coronary intervention at this time. -- Completed 48 hours of heparin.  hx of CAD:  --cont ASA and statin   UTI (urinary tract infection):  Patient has WBC 17.0, but no fever.  Lactic acid is normal 1.0.  Clinically does not seem to have sepsis. -- Discontinue ceftriaxone.   Type I second-degree AV block  --per cardio, hold off beta-blocker --d/c home coreg  Acute renal failure superimposed on stage 3b chronic kidney disease (HCC).  Creatinine seems improving and now close to baseline. -Hold home diuretics,  -Restarting home lisinopril   Fall at home, initial encounter -Fall precaution -PT/OT   T12 compression fracture [facility]): -Mild superior endplate compression fracture of T12, age indeterminate. --Pain control, as needed Percocet,  Tylenol -Lidoderm -Robaxin   Essential hypertension:  Blood pressure started trending up -Restart home lisinopril --d/c coreg due to AV block   Hyperlipidemia -Crestor   Type II diabetes mellitus with renal manifestations (HCC):  Recent A1c 5.6, well-controlled.  Patient is taking Actos --d/c BG checks and SSI, no need.   Chronic diastolic CHF (congestive heart failure) (HCC):  2D echo 05/02/2014 showed EF of 55 to 60% with grade 2 diastolic dysfunction.  Patient does not have leg edema or JVD.  No oxygen desaturation.  Does not seem to have CHF exacerbation. -Check BNP --> 461 -hold diuretics due to worsening renal function and rhabdomyolysis.  Patient clinically appears euvolemic.   Elevated CK and mild rhabdomyolysis:  CK improving.  Depression -Zoloft   DVT prophylaxis: Lovenox Code Status: Full code  Family Communication:  Level of care: Telemetry Cardiac Dispo:   The patient is from: home Anticipated d/c is to: SNF rehab Anticipated d/c date is: Now medically stable, pending SNF placement will   Subjective and Interval History:  Patient was complaining of bilateral lower leg pain.  No chest pain or shortness of breath.  Objective: Vitals:   07/08/23 0500 07/08/23 0506 07/08/23 0752 07/08/23 1103  BP:  131/67 (!) 151/59 (!) 148/60  Pulse:  62 (!) 52 65  Resp:  17    Temp:  98.5 F (36.9 C) 98.1 F (36.7 C) 98.2 F (36.8 C)  TempSrc:  Oral    SpO2:  93% 94% 95%  Weight: 68 kg     Height:  Intake/Output Summary (Last 24 hours) at 07/08/2023 1351 Last data filed at 07/08/2023 0506 Gross per 24 hour  Intake 240 ml  Output 1300 ml  Net -1060 ml   Filed Weights   07/06/23 0503 07/07/23 0500 07/08/23 0500  Weight: 68.8 kg 68.7 kg 68 kg    Examination:   General.  Frail elderly lady, in no acute distress. Pulmonary.  Lungs clear bilaterally, normal respiratory effort. CV.  Regular rate and rhythm, no JVD, rub or murmur. Abdomen.  Soft,  nontender, nondistended, BS positive. CNS.  Alert and oriented .  No focal neurologic deficit. Extremities.  No edema, no cyanosis, pulses intact and symmetrical.  Data Reviewed: I have personally reviewed labs and imaging studies  Time spent: 42 minutes  This record has been created using Conservation officer, historic buildings. Errors have been sought and corrected,but may not always be located. Such creation errors do not reflect on the standard of care.   Arnetha Courser, MD [facility] If 7PM-7AM, please contact night-coverage 07/08/2023, 1:51 PM

## 2023-07-08 NOTE — Progress Notes (Signed)
 SUBJECTIVE: Patient is comfortably sleeping.   Vitals:   07/08/23 0500 07/08/23 0506 07/08/23 0752 07/08/23 1103  BP:  131/67 (!) 151/59 (!) 148/60  Pulse:  62 (!) 52 65  Resp:  17    Temp:  98.5 F (36.9 C) 98.1 F (36.7 C) 98.2 F (36.8 C)  TempSrc:  Oral    SpO2:  93% 94% 95%  Weight: 68 kg     Height:        Intake/Output Summary (Last 24 hours) at 07/08/2023 1134 Last data filed at 07/08/2023 0506 Gross per 24 hour  Intake 240 ml  Output 1301 ml  Net -1061 ml    LABS: Basic Metabolic Panel: Recent Labs    07/07/23 0617 07/08/23 0551  NA 140 140  K 4.0 3.9  CL 110 106  CO2 24 26  GLUCOSE 93 96  BUN 33* 34*  CREATININE 1.60* 1.41*  CALCIUM 9.9 10.1  MG 1.5* 1.5*   Liver Function Tests: No results for input(s): "AST", "ALT", "ALKPHOS", "BILITOT", "PROT", "ALBUMIN" in the last 72 hours. No results for input(s): "LIPASE", "AMYLASE" in the last 72 hours. CBC: Recent Labs    07/06/23 0507 07/07/23 0617  WBC 7.5 7.3  HGB 9.3* 9.7*  HCT 27.5* 28.7*  MCV 89.9 89.1  PLT 120* 118*   Cardiac Enzymes: Recent Labs    07/06/23 0507  CKTOTAL 244*   BNP: Invalid input(s): "POCBNP" D-Dimer: No results for input(s): "DDIMER" in the last 72 hours. Hemoglobin A1C: No results for input(s): "HGBA1C" in the last 72 hours. Fasting Lipid Panel: No results for input(s): "CHOL", "HDL", "LDLCALC", "TRIG", "CHOLHDL", "LDLDIRECT" in the last 72 hours. Thyroid Function Tests: No results for input(s): "TSH", "T4TOTAL", "T3FREE", "THYROIDAB" in the last 72 hours.  Invalid input(s): "FREET3" Anemia Panel: No results for input(s): "VITAMINB12", "FOLATE", "FERRITIN", "TIBC", "IRON", "RETICCTPCT" in the last 72 hours.   PHYSICAL EXAM General: Well developed, well nourished, in no acute distress HEENT:  Normocephalic and atramatic Neck:  No JVD.  Lungs: Clear bilaterally to auscultation and percussion. Heart: HRRR . Normal S1 and S2 without gallops or murmurs.  Abdomen:  Bowel sounds are positive, abdomen soft and non-tender  Msk:  Back normal, normal gait. Normal strength and tone for age. Extremities: No clubbing, cyanosis or edema.   Neuro: Alert and oriented X 3. Psych:  Good affect, responds appropriately  TELEMETRY: Not on the monitor ASSESSMENT AND PLAN: Status post fall and syncopal episode with compression fraction of T12 vertebrae.  Patient did have type I second-degree AV block for which carvedilol was withheld.  Hemodynamically patient is stable with no chest pain or shortness of breath.  Patient is waiting for placement of hospice.  Will sign off.   ICD-10-CM   1. Fall, initial encounter  W19.XXXA     2. Compression fracture of T12 vertebra, initial encounter (HCC)  S22.080A     3. Elevated troponin  R79.89       Principal Problem:   NSTEMI (non-ST elevated myocardial infarction) [facility]) Active Problems:   Essential hypertension   Hyperlipidemia   Fall at home, initial encounter   T12 compression fracture (HCC)   Tachycardia   UTI (urinary tract infection)   CAD (coronary artery disease)   Type II diabetes mellitus with renal manifestations (HCC)   Depression   Chronic diastolic CHF (congestive heart failure) (HCC)   Elevated CK   Acute renal failure superimposed on stage 3b chronic kidney disease (HCC)   Rhabdomyolysis  Adrian Blackwater, MD, [facility] 07/08/2023 11:34 AM

## 2023-07-08 NOTE — TOC Progression Note (Signed)
 Transition of Care [facility]) - Progression Note    Patient Details  Name: [name] MRN: [id number] Date of Birth: Apr 05, 1941  [facility]) CM/SW Contact  Truddie Hidden, RN Phone Number: 07/08/2023, 4:25 PM  Clinical Narrative:    Spoke with patient at bedside regarding SNF. Patient is not agreeable to SNF. She stated she's going home at discharge. She is agreeable to [facility]. She was provided choices for [facility], [facility], and [facility]. She does not have a preference. She was advised [facility] will contact her at discharge to schedule her SOC. Patient's son will transport her home.    Referral sent and accepted by [place] from [facility].     Expected Discharge Plan: Skilled Nursing Facility Barriers to Discharge: Continued Medical Work up  Expected Discharge Plan and Services     Post Acute Care Choice: Skilled Nursing Facility Living arrangements for the past 2 months: Single Family Home                                       Social Determinants of Health (SDOH) Interventions SDOH Screenings   Food Insecurity: No Food Insecurity (07/04/2023)  Housing: High Risk (07/04/2023)  Transportation Needs: No Transportation Needs (07/04/2023)  Utilities: Not At Risk (07/04/2023)  Depression (PHQ2-9): High Risk (04/17/2023)  Social Connections: Socially Isolated (07/04/2023)  Tobacco Use: Low Risk  (07/03/2023)    Readmission Risk Interventions     No data to display

## 2023-07-08 NOTE — Progress Notes (Signed)
 Physical Therapy Treatment Patient Details Name: [name] MRN: [id number] DOB: 06/26/1940 Today's Date: 07/08/2023   History of Present Illness 83 y/o female presented to ED on 07/03/23 for fall, chest pain, and burning with urination. Admitted for NSTEMI and UTI. PMH: HTN, DM, dCHF, depression, CKD-3b, skin cancer, bipolar    PT Comments  Patient progressing towards physical therapy goals. Patient ambulatory in hallway with RW and CGA. Cognition continues to be limiting factor and will require 24/7 supervision/assistance at discharge. If family able to provide necessary supervision, patient could discharge home with HHPT. Will continue to follow.    If plan is discharge home, recommend the following: A little help with walking and/or transfers;A little help with bathing/dressing/bathroom;Assistance with cooking/housework;Direct supervision/assist for medications management;Direct supervision/assist for financial management;Assist for transportation;Help with stairs or ramp for entrance;Supervision due to cognitive status   Can travel by private vehicle     Yes  Equipment Recommendations  Rolling Dariel Pellecchia (2 wheels);BSC/3in1    Recommendations for Other Services       Precautions / Restrictions Precautions Precautions: Fall Recall of Precautions/Restrictions: Impaired Restrictions Weight Bearing Restrictions Per Provider Order: No     Mobility  Bed Mobility Overal bed mobility: Needs Assistance Bed Mobility: Supine to Sit     Supine to sit: Contact guard     General bed mobility comments: increased time to complete    Transfers Overall transfer level: Needs assistance Equipment used: Rolling Santo Zahradnik (2 wheels) Transfers: Sit to/from Stand Sit to Stand: Contact guard assist           General transfer comment: increased time to complete with use of momentum from low bed surface    Ambulation/Gait Ambulation/Gait assistance: Contact guard assist Gait Distance  (Feet): 200 Feet Assistive device: Rolling Keeyon Privitera (2 wheels) Gait Pattern/deviations: Step-through pattern, Decreased stride length Gait velocity: decreased     General Gait Details: CGA for safety. No overt LOB   Stairs             Wheelchair Mobility     Tilt Bed    Modified Rankin (Stroke Patients Only)       Balance Overall balance assessment: Needs assistance Sitting-balance support: No upper extremity supported, Feet supported Sitting balance-Leahy Scale: Fair     Standing balance support: Bilateral upper extremity supported, Reliant on assistive device for balance Standing balance-Leahy Scale: Poor                              Communication Communication Communication: Impaired Factors Affecting Communication: Hearing impaired  Cognition Arousal: Alert Behavior During Therapy: WFL for tasks assessed/performed   PT - Cognitive impairments: Difficult to assess Difficult to assess due to: Hard of hearing/deaf                       Following commands: Intact Following commands impaired: Follows one step commands with increased time    Cueing    Exercises      General Comments        Pertinent Vitals/Pain Pain Assessment Pain Assessment: No/denies pain    Home Living                          Prior Function            PT Goals (current goals can now be found in the care plan section) Acute Rehab PT Goals PT Goal Formulation:  With patient Time For Goal Achievement: 07/18/23 Potential to Achieve Goals: Good Progress towards PT goals: Progressing toward goals    Frequency    Min 2X/week      PT Plan      Co-evaluation              AM-PAC PT "6 Clicks" Mobility   Outcome Measure  Help needed turning from your back to your side while in a flat bed without using bedrails?: A Little Help needed moving from lying on your back to sitting on the side of a flat bed without using bedrails?: A  Little Help needed moving to and from a bed to a chair (including a wheelchair)?: A Little Help needed standing up from a chair using your arms (e.g., wheelchair or bedside chair)?: A Little Help needed to walk in hospital room?: A Little Help needed climbing 3-5 steps with a railing? : A Lot 6 Click Score: 17    End of Session   Activity Tolerance: Patient tolerated treatment well Patient left: in chair;with call bell/phone within reach;with chair alarm set Nurse Communication: Mobility status PT Visit Diagnosis: Unsteadiness on feet (R26.81);Muscle weakness (generalized) (M62.81);Difficulty in walking, not elsewhere classified (R26.2)     Time: 1354-1410 PT Time Calculation (min) (ACUTE ONLY): 16 min  Charges:    $Therapeutic Activity: 8-22 mins PT General Charges $$ ACUTE PT VISIT: 1 Visit                     Maylon Peppers, PT, DPT Physical Therapist - [facility]  [facility]    Gilford Lardizabal A Larnell Granlund 07/08/2023, 2:15 PM

## 2023-07-09 ENCOUNTER — Encounter: Payer: Self-pay | Admitting: Anesthesiology

## 2023-07-09 ENCOUNTER — Encounter: Admission: RE | Payer: Self-pay | Source: Home / Self Care

## 2023-07-09 ENCOUNTER — Ambulatory Visit: Admission: RE | Admit: 2023-07-09 | Payer: Medicare HMO | Source: Home / Self Care | Admitting: Ophthalmology

## 2023-07-09 DIAGNOSIS — S22080A Wedge compression fracture of T11-T12 vertebra, initial encounter for closed fracture: Secondary | ICD-10-CM | POA: Diagnosis not present

## 2023-07-09 DIAGNOSIS — W19XXXA Unspecified fall, initial encounter: Secondary | ICD-10-CM | POA: Diagnosis not present

## 2023-07-09 DIAGNOSIS — I214 Non-ST elevation (NSTEMI) myocardial infarction: Secondary | ICD-10-CM | POA: Diagnosis not present

## 2023-07-09 DIAGNOSIS — R7989 Other specified abnormal findings of blood chemistry: Secondary | ICD-10-CM | POA: Diagnosis not present

## 2023-07-09 LAB — BASIC METABOLIC PANEL
Anion gap: 4 — ABNORMAL LOW (ref 5–15)
BUN: 42 mg/dL — ABNORMAL HIGH (ref 8–23)
CO2: 27 mmol/L (ref 22–32)
Calcium: 10.1 mg/dL (ref 8.9–10.3)
Chloride: 107 mmol/L (ref 98–111)
Creatinine, Ser: 1.57 mg/dL — ABNORMAL HIGH (ref 0.44–1.00)
GFR, Estimated: 33 mL/min — ABNORMAL LOW (ref >60)
Glucose, Bld: 106 mg/dL — ABNORMAL HIGH (ref 70–99)
Potassium: 4.3 mmol/L (ref 3.5–5.1)
Sodium: 138 mmol/L (ref 135–145)

## 2023-07-09 LAB — MAGNESIUM: Magnesium: 2.4 mg/dL (ref 1.7–2.4)

## 2023-07-09 SURGERY — PHACOEMULSIFICATION, CATARACT, WITH IOL INSERTION
Anesthesia: Topical | Laterality: Right

## 2023-07-09 MED ORDER — LIDOCAINE 5 % EX PTCH: 1.0000 | MEDICATED_PATCH | Freq: Every day | CUTANEOUS | 0 refills | Status: DC

## 2023-07-09 MED ORDER — ENSURE ENLIVE PO LIQD: 237.0000 mL | Freq: Two times a day (BID) | ORAL | 12 refills | Status: AC

## 2023-07-09 NOTE — Discharge Summary (Signed)
 Physician Discharge Summary   Patient: [name] MRN: [id number] DOB: 1940/06/05  Admit date:     07/03/2023  Discharge date: 07/09/23  Discharge Physician: Arnetha Courser   PCP: Sherron Monday, MD   Recommendations at discharge:  Please obtain CBC and BMP on follow-up Follow-up with primary care provider within a week Follow-up with cardiology  Discharge Diagnoses: Principal Problem:   NSTEMI (non-ST elevated myocardial infarction) [facility]) Active Problems:   UTI (urinary tract infection)   CAD (coronary artery disease)   Tachycardia   Fall   T12 compression fracture (HCC)   Essential hypertension   Hyperlipidemia   Acute renal failure superimposed on stage 3b chronic kidney disease (HCC)   Type II diabetes mellitus with renal manifestations (HCC)   Chronic diastolic CHF (congestive heart failure) (HCC)   Elevated CK   Rhabdomyolysis   Depression   Elevated troponin   Hospital Course: [name] is a 83 y.o. female with medical history significant of HTN, HLD, DM, dCHF, depression with anxiety, bipolar, CKD-3B, skin cancer, who presented with fall, chest pain, burning on urination   Per report, bystander saw patient laying in yard and called 911.  Patient reports slipping and falling backwards with her walker. She injured her back and right lower leg. She has middle back pain and right lower leg pain.  She states that she has right chest pain.   Patient remained hemodynamically stable, received 48-hour of heparin infusion due to elevated troponin.  Cardiology was consulted and no further cardiac workup needed at this time.  She will continue with aspirin and statin.  Patient did had leukocytosis on admission but no fever or lactic acidosis.  Sepsis ruled out.  She did receive ceftriaxone which was later discontinued.  Patient was also found to have type I second-degree heart block so home Coreg was discontinued.  She will follow-up with her cardiologist for  further assistance.  Patient had mild AKI with underlying CKD stage IIIb on admission which improved with gentle IV hydration and holding home diuretics.  Patient was also found to have mild.  Endplate compression fracture of T12, age-indeterminate.  Pain seems well-controlled.  Our physical therapist evaluated her and recommended going to SNF for further rehab but patient declined and wants to go home.  [facility] services was ordered.  Case was discussed with her son and she is being discharged home with home health.  Patient needed close supervision.  Patient will continue on current medications and need to have a close follow-up with her providers for further assistance.  Consultants: Cardiology Procedures performed: None Disposition: Home health Diet recommendation:  Discharge Diet Orders (From admission, onward)     Start     Ordered   07/09/23 0000  Diet - low sodium heart healthy        07/09/23 1057           Cardiac and Carb modified diet DISCHARGE MEDICATION: Allergies as of 07/09/2023       Reactions   Codeine Nausea And Vomiting   "Makes me deathly sick"        Medication List     STOP taking these medications    carvedilol 6.25 MG tablet Commonly known as: COREG   chlorthalidone 25 MG tablet Commonly known as: HYGROTON   lisinopril 10 MG tablet Commonly known as: ZESTRIL       TAKE these medications    amLODipine 10 MG tablet Commonly known as: NORVASC TAKE 1 TABLET(10  MG) BY MOUTH DAILY   aspirin EC 81 MG tablet Take 81 mg by mouth daily. Swallow whole.   feeding supplement Liqd Take 237 mLs by mouth 2 (two) times daily between meals.   furosemide 20 MG tablet Commonly known as: LASIX TAKE 1 TABLET(20 MG) BY MOUTH DAILY AS NEEDED What changed: See the new instructions.   gabapentin 300 MG capsule Commonly known as: Neurontin Take 1 capsule (300 mg total) by mouth daily in the afternoon.   lidocaine 5 % Commonly known  as: LIDODERM Place 1 patch onto the skin at bedtime. Remove & Discard patch within 12 hours or as directed by MD   pioglitazone 15 MG tablet Commonly known as: ACTOS TAKE 1 TABLET BY MOUTH EVERY MORNING   rosuvastatin 40 MG tablet Commonly known as: CRESTOR TAKE 1 TABLET BY MOUTH EVERY DAY   sertraline 50 MG tablet Commonly known as: ZOLOFT TAKE 1 TABLET(50 MG) BY MOUTH EVERY MORNING   tiZANidine 4 MG capsule Commonly known as: ZANAFLEX Take 4 mg by mouth 3 (three) times daily as needed for muscle spasms.   VITAMIN C PO Take 1 tablet by mouth daily.   Vitamin D3 125 MCG (5000 UT) Tabs Take 1 tablet by mouth daily.        Follow-up Information     Sherron Monday, MD. Schedule Suzanne appointment as soon as possible for a visit in 1 week(s).   Specialty: Internal Medicine Contact information: [address] [place] [address] [phone]                Discharge Exam: Filed Weights   07/07/23 0500 07/08/23 0500 07/09/23 0500  Weight: 68.7 kg 68 kg 66.5 kg   General.  Frail elderly lady, in no acute distress. Pulmonary.  Lungs clear bilaterally, normal respiratory effort. CV.  Regular rate and rhythm, no JVD, rub or murmur. Abdomen.  Soft, nontender, nondistended, BS positive. CNS.  Alert and oriented .  No focal neurologic deficit. Extremities.  No edema, no cyanosis, pulses intact and symmetrical.  Condition at discharge: stable  The results of significant diagnostics from this hospitalization (including imaging, microbiology, ancillary and laboratory) are listed below for reference.   Imaging Studies: ECHOCARDIOGRAM COMPLETE Result Date: 07/05/2023    ECHOCARDIOGRAM REPORT   Patient Name:   [name] Date of Exam: 07/05/2023 Medical Rec #:  [id number]        Height:       63.0 in Accession #:    [id number]       Weight:       144.8 lb Date of Birth:  11/11/1940       BSA:          1.686 m Patient Age:    82 years         BP:           150/55  mmHg Patient Gender: F                HR:           56 bpm. Exam Location:  [facility] Procedure: 2D Echo, Cardiac Doppler and Color Doppler (Both Spectral and Color            Flow Doppler were utilized during procedure). Indications:     Abnormal ECG R94.31  History:         Patient has prior history of Echocardiogram examinations, most  recent 04/30/2014.  Sonographer:     Overton Mam RDCS, FASE Referring Phys:  [id number] Rikita Grabert Diagnosing Phys: Adrian Blackwater IMPRESSIONS  1. Left ventricular ejection fraction, by estimation, is 60 to 65%. The left ventricle has normal function. The left ventricle has no regional wall motion abnormalities. Left ventricular diastolic parameters are consistent with Grade I diastolic dysfunction (impaired relaxation).  2. Right ventricular systolic function is normal. The right ventricular size is normal.  3. The mitral valve is normal in structure. Mild to moderate mitral valve regurgitation. No evidence of mitral stenosis.  4. The aortic valve is normal in structure. Aortic valve regurgitation is not visualized. No aortic stenosis is present.  5. The inferior vena cava is normal in size with greater than 50% respiratory variability, suggesting right atrial pressure of 3 mmHg. FINDINGS  Left Ventricle: Left ventricular ejection fraction, by estimation, is 60 to 65%. The left ventricle has normal function. The left ventricle has no regional wall motion abnormalities. Strain was performed and the global longitudinal strain is indeterminate. The left ventricular internal cavity size was normal in size. There is borderline concentric left ventricular hypertrophy. Left ventricular diastolic parameters are consistent with Grade I diastolic dysfunction (impaired relaxation). Right Ventricle: The right ventricular size is normal. No increase in right ventricular wall thickness. Right ventricular systolic function is normal. Left Atrium: Left atrial size was normal in size.  Right Atrium: Right atrial size was normal in size. Pericardium: There is no evidence of pericardial effusion. Mitral Valve: The mitral valve is normal in structure. Mild to moderate mitral valve regurgitation. No evidence of mitral valve stenosis. Tricuspid Valve: The tricuspid valve is normal in structure. Tricuspid valve regurgitation is mild . No evidence of tricuspid stenosis. Aortic Valve: The aortic valve is normal in structure. Aortic valve regurgitation is not visualized. No aortic stenosis is present. Aortic valve peak gradient measures 7.1 mmHg. Pulmonic Valve: The pulmonic valve was normal in structure. Pulmonic valve regurgitation is trivial. No evidence of pulmonic stenosis. Aorta: The aortic root is normal in size and structure. Venous: The inferior vena cava is normal in size with greater than 50% respiratory variability, suggesting right atrial pressure of 3 mmHg. IAS/Shunts: No atrial level shunt detected by color flow Doppler. Additional Comments: 3D was performed not requiring image post processing on Suzanne independent workstation and was indeterminate.  LEFT VENTRICLE PLAX 2D LVIDd:         4.60 cm   Diastology LVIDs:         3.20 cm   LV e' medial:    6.64 cm/s LV PW:         1.10 cm   LV E/e' medial:  20.2 LV IVS:        1.00 cm   LV e' lateral:   13.30 cm/s LVOT diam:     1.90 cm   LV E/e' lateral: 10.1 LV SV:         56 LV SV Index:   33 LVOT Area:     2.84 cm  RIGHT VENTRICLE RV Basal diam:  2.70 cm TAPSE (M-mode): 1.6 cm LEFT ATRIUM             Index        RIGHT ATRIUM           Index LA diam:        4.30 cm 2.55 cm/m   RA Area:     12.30 cm LA Vol (A2C):   74.7 ml 44.32  ml/m  RA Volume:   26.20 ml  15.54 ml/m LA Vol (A4C):   65.8 ml 39.04 ml/m LA Biplane Vol: 71.2 ml 42.24 ml/m  AORTIC VALVE                 PULMONIC VALVE AV Area (Vmax): 1.79 cm     PV Vmax:        1.15 m/s AV Vmax:        133.00 cm/s  PV Peak grad:   5.3 mmHg AV Peak Grad:   7.1 mmHg     RVOT Peak grad: 3 mmHg LVOT  Vmax:      83.90 cm/s LVOT Vmean:     54.400 cm/s LVOT VTI:       0.196 m  AORTA Ao Root diam: 3.10 cm Ao Asc diam:  2.90 cm MITRAL VALVE                TRICUSPID VALVE MV Area (PHT): 2.59 cm     TR Peak grad:   30.5 mmHg MV Decel Time: 293 msec     TR Vmax:        276.00 cm/s MV E velocity: 134.00 cm/s MV A velocity: 66.10 cm/s   SHUNTS MV E/A ratio:  2.03         Systemic VTI:  0.20 m                             Systemic Diam: 1.90 cm Adrian Blackwater Electronically signed by Adrian Blackwater Signature Date/Time: 07/05/2023/4:40:35 PM    Final    DG Tibia/Fibula Right Result Date: 07/04/2023 CLINICAL DATA:  Right lower leg pain, swelling EXAM: RIGHT TIBIA AND FIBULA - 2 VIEW COMPARISON:  05/02/2014 FINDINGS: Degenerative changes in the right knee and right ankle with joint space narrowing and spurring. No acute bony abnormality. Specifically, no fracture, subluxation, or dislocation. IMPRESSION: No acute bony abnormality. Electronically Signed   By: Charlett Nose M.D.   On: 07/04/2023 00:06   CT Cervical Spine Wo Contrast Result Date: 07/03/2023 CLINICAL DATA:  Neck trauma. Found lying in her yard. Altered mental status. EXAM: CT CERVICAL SPINE WITHOUT CONTRAST TECHNIQUE: Multidetector CT imaging of the cervical spine was performed without intravenous contrast. Multiplanar CT image reconstructions were also generated. RADIATION DOSE REDUCTION: This exam was performed according to the departmental dose-optimization program which includes automated exposure control, adjustment of the mA and/or kV according to patient size and/or use of iterative reconstruction technique. COMPARISON:  CT cervical spine 04/30/2014 FINDINGS: Alignment: Plantar dens interval is intact. Moderate degenerative changes in this region are minimally increased from prior. 1.5 mm grade 1 anterolisthesis of C3 on C4, unchanged from prior. 3 mm grade 1 anterolisthesis of C4 on C5 minimally increased from 2 mm previously, likely related to worsened  facet joint and disc degenerative changes. Minimal kyphotic angulation centered at C5-6, similar to prior. Skull base and vertebrae: Vertebral body heights are maintained. Mild-to-moderate posterior left greater than right C4-5 disc space narrowing is mildly worsened from prior. Moderate to severe anterior right C5-6 and moderate to severe diffuse C6-7 disc space narrowing, similar to prior. Moderate C7-T1 disc space narrowing is mildly worsened from prior. No acute fracture is seen. Soft tissues and spinal canal: No prevertebral fluid or swelling. No visible canal hematoma. Disc levels: Multilevel degenerative changes including disc space narrowing, uncovertebral hypertrophy, and facet joint hypertrophy contribute to mild left C3-4, moderate left C4-5, mild-to-moderate right  C5-6, mild bilateral C6-7, and borderline mild left C7-T1 neuroforaminal stenosis. Upper chest: The lung apices are clear. Other: High-grade atherosclerotic calcifications are seen within the vertebral arteries. Incidental note of 1.2 cm low-density right thyroid nodule. No follow-up imaging is recommended. Moderate to high-grade bilateral carotid bulb atherosclerotic calcifications. IMPRESSION: 1. No acute fracture or traumatic listhesis of the cervical spine. 2. Multilevel degenerative disc and joint changes as above. Electronically Signed   By: Neita Garnet M.D.   On: 07/03/2023 18:34   CT Head Wo Contrast Result Date: 07/03/2023 CLINICAL DATA:  Mental status change, unknown cause. Found lying in yard. Headache, neck pain, and lower back pain. EXAM: CT HEAD WITHOUT CONTRAST TECHNIQUE: Contiguous axial images were obtained from the base of the skull through the vertex without intravenous contrast. RADIATION DOSE REDUCTION: This exam was performed according to the departmental dose-optimization program which includes automated exposure control, adjustment of the mA and/or kV according to patient size and/or use of iterative reconstruction  technique. COMPARISON:  MRI brain 03/17/2018, CT brain 04/30/2014 FINDINGS: Brain: There is mild cortical atrophy, within normal limits for patient age. The ventricles are normal in configuration. The basilar cisterns are patent. No mass, mass effect, or midline shift. Unchanged focal lucency indicating a remote lacunar infarct superior today left putamen. No acute intracranial hemorrhage is seen. No abnormal extra-axial fluid collection. Preservation of the normal cortical gray-white interface without CT evidence of Suzanne acute major vascular territorial cortical based infarction. Vascular: No hyperdense vessel or unexpected calcification. There are atherosclerotic intracranial calcifications within the skull base. Skull: Normal. Negative for fracture or focal lesion. Sinuses/Orbits: The visualized orbits are unremarkable. The visualized paranasal sinuses and mastoid air cells are clear. Other: None. IMPRESSION: 1. No acute intracranial abnormality. 2. 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 Electronically Signed   By: Neita Garnet M.D.   On: 07/03/2023 18:24   DG Chest 2 View Result Date: 07/03/2023 CLINICAL DATA:  Altered mental status. EXAM: CHEST - 2 VIEW COMPARISON:  09/16/2014 FINDINGS: Prior median sternotomy. The heart is upper normal in size. Mediastinal contours are normal. Mild vascular congestion without edema. No focal airspace disease. No pleural effusion or pneumothorax. The bones are subjectively under mineralized. IMPRESSION: Borderline cardiomegaly with mild vascular congestion. Electronically Signed   By: Narda Rutherford M.D.   On: 07/03/2023 18:12   DG Lumbar Spine 2-3 Views Result Date: 07/03/2023 CLINICAL DATA:  Pain after fall. EXAM: LUMBAR SPINE - 2-3 VIEW COMPARISON:  CT 04/30/2014 FINDINGS: The bones are diffusely under mineralized. Mild superior endplate compression fracture of T12 with approximately 30% loss of height, age indeterminate. The lumbar vertebral  body heights are normal. 7 mm anterolisthesis of L4 on L5. Trace anterolisthesis of L5 on S1. Degenerative disc disease with disc space narrowing and spurring L1-L2, L2-L3, L4-L5 and L5-S1. Moderate L4-L5 and mild L5-S1 facet hypertrophy. No sacroiliac diastasis. IMPRESSION: 1. Mild superior endplate compression fracture of T12, age indeterminate. 2. No lumbar spine fracture. 3. Moderate multilevel degenerative disc disease and facet hypertrophy. 4. Grade 1 anterolisthesis of L4 on L5 and trace anterolisthesis of L5 on S1. Electronically Signed   By: Narda Rutherford M.D.   On: 07/03/2023 18:11    Microbiology: Results for orders placed or performed during the hospital encounter of 07/03/23  Blood culture (routine x 2)     Status: None (Preliminary result)   Collection Time: 07/03/23  8:50 PM   Specimen: BLOOD RIGHT ARM  Result Value Ref Range Status  Specimen Description BLOOD RIGHT ARM  Final   Special Requests   Final    BOTTLES DRAWN AEROBIC AND ANAEROBIC Blood Culture results may not be optimal due to Suzanne inadequate volume of blood received in culture bottles   Culture   Final    NO GROWTH 4 DAYS Performed at [facility], [address] [place], [place] [address]    Report Status PENDING  Incomplete  Urine Culture (for pregnant, neutropenic or urologic patients or patients with Suzanne indwelling urinary catheter)     Status: Abnormal   Collection Time: 07/03/23  9:50 PM   Specimen: Urine, Random  Result Value Ref Range Status   Specimen Description   Final    URINE, RANDOM Performed at [facility], [address] [place], [place] [address]    Special Requests   Final    NONE Performed at [facility], [address] [place], [place] [address]    Culture MULTIPLE SPECIES PRESENT, SUGGEST RECOLLECTION (A)  Final   Report Status 07/05/2023 FINAL  Final    Labs: CBC: Recent Labs  Lab 07/03/23 1727 07/04/23 0402 07/05/23 0550 07/06/23 0507  07/07/23 0617  WBC 17.0* 15.6* 8.8 7.5 7.3  HGB 11.3* 9.1* 9.0* 9.3* 9.7*  HCT 34.7* 27.6* 27.3* 27.5* 28.7*  MCV 92.8 92.3 90.4 89.9 89.1  PLT 194 156 129* 120* 118*   Basic Metabolic Panel: Recent Labs  Lab 07/05/23 0550 07/06/23 0507 07/07/23 0617 07/08/23 0551 07/09/23 0556  NA 141 140 140 140 138  K 3.9 3.8 4.0 3.9 4.3  CL 113* 112* 110 106 107  CO2 20* 23 24 26 27   GLUCOSE 77 104* 93 96 106*  BUN 33* 29* 33* 34* 42*  CREATININE 1.80* 1.53* 1.60* 1.41* 1.57*  CALCIUM 9.9 9.6 9.9 10.1 10.1  MG 2.5* 1.9 1.5* 1.5* 2.4   Liver Function Tests: Recent Labs  Lab 07/03/23 1727  AST 45*  ALT 19  ALKPHOS 63  BILITOT 1.7*  PROT 7.2  ALBUMIN 4.5   CBG: Recent Labs  Lab 07/03/23 2311 07/04/23 0742 07/04/23 1206 07/04/23 1732 07/04/23 1820  GLUCAP 103* 73 108* 127* 98    Discharge time spent: greater than 30 minutes.  This record has been created using Conservation officer, historic buildings. Errors have been sought and corrected,but may not always be located. Such creation errors do not reflect on the standard of care.   Signed: Arnetha Courser, MD [facility] 07/09/2023

## 2023-07-09 NOTE — Progress Notes (Signed)
 Discharge instructions given to and reviewed with patient and her son. Both verbalized understanding of all discharge instructions including changes to home medications and follow up care. Patient being discharged home with son driving.

## 2023-07-09 NOTE — Plan of Care (Signed)

## 2023-07-09 NOTE — TOC Transition Note (Signed)
 Transition of Care [facility]) - Discharge Note   Patient Details  Name: [name] MRN: [id number] Date of Birth: 1940/07/01  [facility]) CM/SW Contact:  Truddie Hidden, RN Phone Number: 07/09/2023, 11:30 AM   Clinical Narrative:    Patient discharging.  [place] from [facility] notified of discharge.  Spoke with patient'sson to advised [facility] will contact patient to schedule SOC.   TOC signing off.        Barriers to Discharge: Continued Medical Work up   Patient Goals and CMS Choice Patient states their goals for this hospitalization and ongoing recovery are:: SNF for short term rehab, with possibility of long term placement at some point   Choice offered to / list presented to : Adult Children      Discharge Placement                       Discharge Plan and Services Additional resources added to the After Visit Summary for       Post Acute Care Choice: Skilled Nursing Facility                               Social Drivers of Health (SDOH) Interventions SDOH Screenings   Food Insecurity: No Food Insecurity (07/04/2023)  Housing: High Risk (07/04/2023)  Transportation Needs: No Transportation Needs (07/04/2023)  Utilities: Not At Risk (07/04/2023)  Depression (PHQ2-9): High Risk (04/17/2023)  Social Connections: Socially Isolated (07/04/2023)  Tobacco Use: Low Risk  (07/03/2023)     Readmission Risk Interventions     No data to display

## 2023-07-10 ENCOUNTER — Telehealth: Payer: Self-pay

## 2023-07-10 NOTE — Telephone Encounter (Signed)
 Sherry from [facility] called to let you know that the patient Is being treated by them for palliative care .

## 2023-07-10 NOTE — Transitions of Care (Post Inpatient/ED Visit) (Signed)
   07/10/2023  Name: [name] MRN: [id number] DOB: May 04, 1940  Today's TOC FU Call Status: Today's TOC FU Call Status:: Unsuccessful Call (1st Attempt) Unsuccessful Call (1st Attempt) Date: 07/10/23  Attempted to reach the patient regarding the most recent Inpatient/ED visit.  Follow Up Plan: Additional outreach attempts will be made to reach the patient to complete the Transitions of Care (Post Inpatient/ED visit) call.   Deidre Ala, BSN, RN [place]  VBCI - [employer] [facility] RN Care Manager [phone]

## 2023-07-11 ENCOUNTER — Encounter: Payer: Self-pay | Admitting: Cardiology

## 2023-07-11 ENCOUNTER — Ambulatory Visit (INDEPENDENT_AMBULATORY_CARE_PROVIDER_SITE_OTHER): Admitting: Cardiology

## 2023-07-11 ENCOUNTER — Telehealth: Payer: Self-pay

## 2023-07-11 VITALS — BP 108/52 | HR 61 | Ht 64.0 in | Wt 143.0 lb

## 2023-07-11 DIAGNOSIS — W19XXXD Unspecified fall, subsequent encounter: Secondary | ICD-10-CM

## 2023-07-11 DIAGNOSIS — F03B18 Unspecified dementia, moderate, with other behavioral disturbance: Secondary | ICD-10-CM | POA: Diagnosis not present

## 2023-07-11 DIAGNOSIS — Z0130 Encounter for examination of blood pressure without abnormal findings: Secondary | ICD-10-CM

## 2023-07-11 LAB — CULTURE, BLOOD (ROUTINE X 2): Culture: NO GROWTH

## 2023-07-11 NOTE — Progress Notes (Signed)
 Established Patient Office Visit  Subjective:  Patient ID: [name], female    DOB: 11/15/1940  Age: 83 y.o. MRN: [id number]  Chief Complaint  Patient presents with   Hospitalization Follow-up    Hospital Follow Up    Patient in office for hospital follow up. Patient discharged from the hospital on 07/09/23 after being admitted for a fall at home. Per report, bystander saw patient laying in yard and called 911.  Patient reports slipping and falling backwards with her walker. She injured her back and right lower leg. Patient currently lives at home alone. Family reports patient has been having hallucinations. Patient refused SNF on discharge from hospital. Patient also refused home health and palliative care at discharge. Patient and family present today for hospital follow up. Patient has no complaints today. Son and daughter in law are concerned for patient due to her living alone, falling frequently. Discussed palliative care at home to help patient get the help she needs. Patient is agreeable.     No other concerns at this time.   Past Medical History:  Diagnosis Date   Arthritis    "hands, feet, knees, shoulders" (05/05/2014)   Bipolar disorder (HCC)    Cataracts, bilateral    immature   Chronic kidney disease    "dr says they aren't functioning like they should" (05/05/2014)   Coronary artery disease    Depression    takes Zoloft daily   History of bronchitis 45yrs ago   Hyperlipidemia    takes Atorvastatin daily   Hypertension    takes Metoprolol,Lisinopril,and Apresoline daily   Joint pain    Peripheral edema    takes Chlothalidone daily   Squamous cell cancer of skin of nose 2012   "right"   Squamous cell cancer of skin of nose    Type II diabetes mellitus (HCC)    takes Tradjenta daily    Past Surgical History:  Procedure Laterality Date   COLONOSCOPY WITH PROPOFOL N/A 02/03/2018   Procedure: COLONOSCOPY WITH PROPOFOL;  Surgeon: Christena Deem, MD;   Location: [facility] ENDOSCOPY;  Service: Endoscopy;  Laterality: N/A;   CORONARY ARTERY BYPASS GRAFT  05/12/2007   "CABG X1; at [facility]"   coronary artery bypass with arterial grafts     DILATION AND CURETTAGE OF UTERUS  2005   FRACTURE SURGERY     I & D EXTREMITY Right 04/30/2014   Procedure: IRRIGATION AND DEBRIDEMENT EXTREMITY;  Surgeon: Toni Arthurs, MD;  Location: [facility] OR;  Service: Orthopedics;  Laterality: Right;   INCISION AND DRAINAGE Right 08/11/2014   Procedure: REMOVAL OF DEEP IMPLANTS OF RIGHT TIBIAL FIBIAL INCISION AND DRAINAGE OF RIGHT ANKLE WOUND;  Surgeon: Toni Arthurs, MD;  Location: [facility] OR;  Service: Orthopedics;  Laterality: Right;   ORIF ANKLE FRACTURE Right 04/30/2014   Procedure: OPEN REDUCTION INTERNAL FIXATION (ORIF) ANKLE FRACTURE;  Surgeon: Toni Arthurs, MD;  Location: [facility] OR;  Service: Orthopedics;  Laterality: Right;   REMOVAL OF IMPLANT Right 08/11/2014   Procedure: REMOVAL OF DEEP IMPLANTS OF RIGHT TIBIAL FIBIAL INCISION AND DRAINAGE OF RIGHT ANKLE WOUND;  Surgeon: Toni Arthurs, MD;  Location: [facility] OR;  Service: Orthopedics;  Laterality: Right;   SQUAMOUS CELL CARCINOMA EXCISION Right 2012   "nose"   VAGINAL HYSTERECTOMY  2007    Social History   Socioeconomic History   Marital status: Widowed    Spouse name: Not on file   Number of children: Not on file   Years of education: Not on file  Highest education level: Not on file  Occupational History   Not on file  Tobacco Use   Smoking status: Never   Smokeless tobacco: Never  Vaping Use   Vaping status: Never Used  Substance and Sexual Activity   Alcohol use: No    Alcohol/week: 0.0 standard drinks of alcohol   Drug use: No   Sexual activity: Not on file  Other Topics Concern   Not on file  Social History Narrative   Not on file   Social Drivers of Health   Financial Resource Strain: Not on file  Food Insecurity: No Food Insecurity (07/11/2023)   Hunger Vital Sign    Worried About Running Out of Food in the Last  Year: Never true    Ran Out of Food in the Last Year: Never true  Transportation Needs: No Transportation Needs (07/11/2023)   PRAPARE - Administrator, Civil Service (Medical): No    Lack of Transportation (Non-Medical): No  Physical Activity: Not on file  Stress: Not on file  Social Connections: Socially Isolated (07/04/2023)   Social Connection and Isolation Panel [NHANES]    Frequency of Communication with Friends and Family: More than three times a week    Frequency of Social Gatherings with Friends and Family: More than three times a week    Attends Religious Services: Never    Database administrator or Organizations: No    Attends Banker Meetings: Never    Marital Status: Widowed  Intimate Partner Violence: Not At Risk (07/11/2023)   Humiliation, Afraid, Rape, and Kick questionnaire    Fear of Current or Ex-Partner: No    Emotionally Abused: No    Physically Abused: No    Sexually Abused: No    Family History  Problem Relation Age of Onset   Heart disease Sister     Allergies  Allergen Reactions   Codeine Nausea And Vomiting    "Makes me deathly sick"    Outpatient Medications Prior to Visit  Medication Sig   amLODipine (NORVASC) 10 MG tablet TAKE 1 TABLET(10 MG) BY MOUTH DAILY   Ascorbic Acid (VITAMIN C PO) Take 1 tablet by mouth daily.   aspirin EC 81 MG tablet Take 81 mg by mouth daily. Swallow whole.   Cholecalciferol (VITAMIN D3) 5000 units TABS Take 1 tablet by mouth daily.   feeding supplement (ENSURE ENLIVE / ENSURE PLUS) LIQD Take 237 mLs by mouth 2 (two) times daily between meals.   furosemide (LASIX) 20 MG tablet TAKE 1 TABLET(20 MG) BY MOUTH DAILY AS NEEDED (Patient taking differently: Take 20 mg by mouth daily as needed for fluid or edema.)   gabapentin (NEURONTIN) 300 MG capsule Take 1 capsule (300 mg total) by mouth daily in the afternoon. (Patient not taking: Reported on 07/04/2023)   lidocaine (LIDODERM) 5 % Place 1 patch onto  the skin at bedtime. Remove & Discard patch within 12 hours or as directed by MD   pioglitazone (ACTOS) 15 MG tablet TAKE 1 TABLET BY MOUTH EVERY MORNING   rosuvastatin (CRESTOR) 40 MG tablet TAKE 1 TABLET BY MOUTH EVERY DAY   sertraline (ZOLOFT) 50 MG tablet TAKE 1 TABLET(50 MG) BY MOUTH EVERY MORNING   tiZANidine (ZANAFLEX) 4 MG capsule Take 4 mg by mouth 3 (three) times daily as needed for muscle spasms.   No facility-administered medications prior to visit.    Review of Systems  Constitutional: Negative.   HENT: Negative.    Eyes: Negative.  Respiratory: Negative.  Negative for shortness of breath.   Cardiovascular: Negative.  Negative for chest pain.  Gastrointestinal: Negative.  Negative for abdominal pain, constipation and diarrhea.  Genitourinary: Negative.   Musculoskeletal:  Negative for joint pain and myalgias.  Skin: Negative.   Neurological: Negative.  Negative for dizziness and headaches.  Endo/Heme/Allergies: Negative.   All other systems reviewed and are negative.      Objective:   BP (!) 108/52   Pulse 61   Ht 5\' 4"  (1.626 m)   Wt 143 lb (64.9 kg)   SpO2 97%   BMI 24.55 kg/m   Vitals:   07/11/23 1354  BP: (!) 108/52  Pulse: 61  Height: 5\' 4"  (1.626 m)  Weight: 143 lb (64.9 kg)  SpO2: 97%  BMI (Calculated): 24.53    Physical Exam Vitals and nursing note reviewed.  Constitutional:      Appearance: Normal appearance. She is normal weight.  HENT:     Head: Normocephalic and atraumatic.     Nose: Nose normal.     Mouth/Throat:     Mouth: Mucous membranes are moist.  Eyes:     Extraocular Movements: Extraocular movements intact.     Conjunctiva/sclera: Conjunctivae normal.     Pupils: Pupils are equal, round, and reactive to light.  Cardiovascular:     Rate and Rhythm: Normal rate and regular rhythm.     Pulses: Normal pulses.     Heart sounds: Normal heart sounds.  Pulmonary:     Effort: Pulmonary effort is normal.     Breath sounds:  Normal breath sounds.  Abdominal:     General: Abdomen is flat. Bowel sounds are normal.     Palpations: Abdomen is soft.  Musculoskeletal:        General: Normal range of motion.     Cervical back: Normal range of motion.  Skin:    General: Skin is warm and dry.  Neurological:     General: No focal deficit present.     Mental Status: She is alert and oriented to person, place, and time.  Psychiatric:        Mood and Affect: Mood normal.        Behavior: Behavior normal.        Thought Content: Thought content normal.        Judgment: Judgment normal.      No results found for any visits on 07/11/23.  Recent Results (from the past 2160 hours)  Comprehensive metabolic panel     Status: Abnormal   Collection Time: 07/03/23  5:27 PM  Result Value Ref Range   Sodium 142 135 - 145 mmol/L    Comment: ELECTROLYTES REPEATED TO VERIFY SS   Potassium 3.5 3.5 - 5.1 mmol/L   Chloride 105 98 - 111 mmol/L   CO2 16 (L) 22 - 32 mmol/L   Glucose, Bld 128 (H) 70 - 99 mg/dL    Comment: Glucose reference range applies only to samples taken after fasting for at least 8 hours.   BUN 42 (H) 8 - 23 mg/dL   Creatinine, Ser 1.61 (H) 0.44 - 1.00 mg/dL   Calcium 09.6 (H) 8.9 - 10.3 mg/dL   Total Protein 7.2 6.5 - 8.1 g/dL   Albumin 4.5 3.5 - 5.0 g/dL   AST 45 (H) 15 - 41 U/L   ALT 19 0 - 44 U/L   Alkaline Phosphatase 63 38 - 126 U/L   Total Bilirubin 1.7 (H) 0.0 - 1.2 mg/dL  GFR, Estimated 21 (L) >60 mL/min    Comment: (NOTE) Calculated using the CKD-EPI Creatinine Equation (2021)    Anion gap 21 (H) 5 - 15    Comment: Performed at [facility], [address] [place], [place] [address]  CBC     Status: Abnormal   Collection Time: 07/03/23  5:27 PM  Result Value Ref Range   WBC 17.0 (H) 4.0 - 10.5 K/uL   RBC 3.74 (L) 3.87 - 5.11 MIL/uL   Hemoglobin 11.3 (L) 12.0 - 15.0 g/dL   HCT 13.0 (L) 86.5 - 78.4 %   MCV 92.8 80.0 - 100.0 fL   MCH 30.2 26.0 - 34.0 pg   MCHC 32.6 30.0 -  36.0 g/dL   RDW 69.6 29.5 - 28.4 %   Platelets 194 150 - 400 K/uL   nRBC 0.0 0.0 - 0.2 %    Comment: Performed at [facility], [address] [place], [place] [address]  CK     Status: Abnormal   Collection Time: 07/03/23  5:27 PM  Result Value Ref Range   Total CK 510 (H) 38 - 234 U/L    Comment: Performed at [facility], [address] [place], [place] [address]  Troponin I (High Sensitivity)     Status: Abnormal   Collection Time: 07/03/23  5:27 PM  Result Value Ref Range   Troponin I (High Sensitivity) 92 (H) <18 ng/L    Comment: (NOTE) Elevated high sensitivity troponin I (hsTnI) values and significant  changes across serial measurements may suggest ACS but many other  chronic and acute conditions are known to elevate hsTnI results.  Refer to the "Links" section for chest pain algorithms and additional  guidance. Performed at [facility], [address] [place], [place] [address]   Brain natriuretic peptide     Status: Abnormal   Collection Time: 07/03/23  5:27 PM  Result Value Ref Range   B Natriuretic Peptide 461.0 (H) 0.0 - 100.0 pg/mL    Comment: Performed at [facility], [address] [place], [place] [address]  Protime-INR     Status: Abnormal   Collection Time: 07/03/23  5:27 PM  Result Value Ref Range   Prothrombin Time 15.3 (H) 11.4 - 15.2 seconds   INR 1.2 0.8 - 1.2    Comment: (NOTE) INR goal varies based on device and disease states. Performed at [facility], [address] [place], [place] [address]   Troponin I (High Sensitivity)     Status: Abnormal   Collection Time: 07/03/23  8:45 PM  Result Value Ref Range   Troponin I (High Sensitivity) 123 (HH) <18 ng/L    Comment: CRITICAL RESULT CALLED TO, READ BACK BY AND VERIFIED WITH ASHLEY VAUGHN AT 2153 ON 07/03/23 BY SS (NOTE) Elevated high sensitivity troponin I (hsTnI) values and significant  changes across serial measurements may suggest ACS  but many other  chronic and acute conditions are known to elevate hsTnI results.  Refer to the "Links" section for chest pain algorithms and additional  guidance. Performed at [facility], [address] [place], [place] [address]   Lactic acid, plasma     Status: None   Collection Time: 07/03/23  8:45 PM  Result Value Ref Range   Lactic Acid, Venous 1.0 0.5 - 1.9 mmol/L    Comment: Performed at [facility], [address], [place], [place] [address]  Procalcitonin     Status: None  Collection Time: 07/03/23  8:45 PM  Result Value Ref Range   Procalcitonin 0.99 ng/mL    Comment:        Interpretation: PCT > 0.5 ng/mL and <= 2 ng/mL: Systemic infection (sepsis) is possible, but other conditions are known to elevate PCT as well. (NOTE)       Sepsis PCT Algorithm           Lower Respiratory Tract                                      Infection PCT Algorithm    ----------------------------     ----------------------------         PCT < 0.25 ng/mL                PCT < 0.10 ng/mL          Strongly encourage             Strongly discourage   discontinuation of antibiotics    initiation of antibiotics    ----------------------------     -----------------------------       PCT 0.25 - 0.50 ng/mL            PCT 0.10 - 0.25 ng/mL               OR       >80% decrease in PCT            Discourage initiation of                                            antibiotics      Encourage discontinuation           of antibiotics    ----------------------------     -----------------------------         PCT >= 0.50 ng/mL              PCT 0.26 - 0.50 ng/mL                AND       <80% decrease in PCT             Encourage initiation of                                             antibiotics       Encourage continuation           of antibiotics    ----------------------------     -----------------------------        PCT >= 0.50 ng/mL                  PCT > 0.50 ng/mL                AND         increase in PCT                  Strongly encourage                                      initiation of antibiotics  Strongly encourage escalation           of antibiotics                                     -----------------------------                                           PCT <= 0.25 ng/mL                                                 OR                                        > 80% decrease in PCT                                      Discontinue / Do not initiate                                             antibiotics  Performed at [facility], [address] [place], [place] [address]   Blood culture (routine x 2)     Status: None   Collection Time: 07/03/23  8:50 PM   Specimen: BLOOD RIGHT ARM  Result Value Ref Range   Specimen Description BLOOD RIGHT ARM    Special Requests      BOTTLES DRAWN AEROBIC AND ANAEROBIC Blood Culture results may not be optimal due to an inadequate volume of blood received in culture bottles   Culture      NO GROWTH 5 DAYS Performed at [facility], [address] [place], [place] [address]    Report Status 07/11/2023 FINAL   Troponin I (High Sensitivity)     Status: Abnormal   Collection Time: 07/03/23  9:45 PM  Result Value Ref Range   Troponin I (High Sensitivity) 123 (HH) <18 ng/L    Comment: CRITICAL VALUE NOTED. VALUE IS CONSISTENT WITH PREVIOUSLY REPORTED/CALLED VALUE ASW (NOTE) Elevated high sensitivity troponin I (hsTnI) values and significant  changes across serial measurements may suggest ACS but many other  chronic and acute conditions are known to elevate hsTnI results.  Refer to the "Links" section for chest pain algorithms and additional  guidance. Performed at [facility], [address] [place], [place] [address]   Urinalysis, Routine w reflex microscopic -Urine, Clean Catch     Status: Abnormal   Collection Time: 07/03/23  9:50 PM  Result Value Ref Range   Color,  Urine YELLOW (A) YELLOW   APPearance HAZY (A) CLEAR   Specific Gravity, Urine 1.011 1.005 - 1.030   pH 5.0 5.0 - 8.0   Glucose, UA NEGATIVE NEGATIVE mg/dL   Hgb urine dipstick MODERATE (A) NEGATIVE   Bilirubin Urine NEGATIVE NEGATIVE   Ketones, ur 20 (A) NEGATIVE mg/dL   Protein, ur 914 (A) NEGATIVE mg/dL  Nitrite NEGATIVE NEGATIVE   Leukocytes,Ua SMALL (A) NEGATIVE   RBC / HPF 0-5 0 - 5 RBC/hpf   WBC, UA 21-50 0 - 5 WBC/hpf   Bacteria, UA MANY (A) NONE SEEN   Squamous Epithelial / HPF 0-5 0 - 5 /HPF   WBC Clumps PRESENT    Mucus PRESENT    Hyaline Casts, UA PRESENT     Comment: Performed at [facility], [address] [place], [place] [address]  Urine Culture (for pregnant, neutropenic or urologic patients or patients with an indwelling urinary catheter)     Status: Abnormal   Collection Time: 07/03/23  9:50 PM   Specimen: Urine, Random  Result Value Ref Range   Specimen Description      URINE, RANDOM Performed at [facility], [address] [place], [place] [address]    Special Requests      NONE Performed at [facility], [address], [place], [place] [address]    Culture MULTIPLE SPECIES PRESENT, SUGGEST RECOLLECTION (A)    Report Status 07/05/2023 FINAL   CBG monitoring, ED     Status: Abnormal   Collection Time: 07/03/23 11:11 PM  Result Value Ref Range   Glucose-Capillary 103 (H) 70 - 99 mg/dL    Comment: Glucose reference range applies only to samples taken after fasting for at least 8 hours.  Hemoglobin A1c     Status: None   Collection Time: 07/04/23  1:00 AM  Result Value Ref Range   Hgb A1c MFr Bld 5.1 4.8 - 5.6 %    Comment: (NOTE) Pre diabetes:          5.7%-6.4%  Diabetes:              >6.4%  Glycemic control for   <7.0% adults with diabetes    Mean Plasma Glucose 99.67 mg/dL    Comment: Performed at [facility], [address] [place], [place] [address]  APTT     Status: None   Collection Time: 07/04/23   1:00 AM  Result Value Ref Range   aPTT 30 24 - 36 seconds    Comment: Performed at [facility], [address] [place], [place] [address]  Troponin I (High Sensitivity)     Status: Abnormal   Collection Time: 07/04/23  1:00 AM  Result Value Ref Range   Troponin I (High Sensitivity) 153 (HH) <18 ng/L    Comment: CRITICAL VALUE NOTED. VALUE IS CONSISTENT WITH PREVIOUSLY REPORTED/CALLED VALUE ASW (NOTE) Elevated high sensitivity troponin I (hsTnI) values and significant  changes across serial measurements may suggest ACS but many other  chronic and acute conditions are known to elevate hsTnI results.  Refer to the "Links" section for chest pain algorithms and additional  guidance. Performed at [facility], [address] [place], [place] [address]   Lipid panel     Status: None   Collection Time: 07/04/23  4:02 AM  Result Value Ref Range   Cholesterol 106 0 - 200 mg/dL   Triglycerides 55 <578 mg/dL   HDL 44 >46 mg/dL   Total CHOL/HDL Ratio 2.4 RATIO   VLDL 11 0 - 40 mg/dL   LDL Cholesterol 51 0 - 99 mg/dL    Comment:        Total Cholesterol/HDL:CHD Risk Coronary Heart Disease Risk Table                     Men   Women  1/2 Average Risk   3.4   3.3  Average Risk       5.0   4.4  2 X Average Risk   9.6   7.1  3 X Average Risk  23.4   11.0        Use the calculated Patient Ratio above and the CHD Risk Table to determine the patient's CHD Risk.        ATP III CLASSIFICATION (LDL):  <100     mg/dL   Optimal  403-474  mg/dL   Near or Above                    Optimal  130-159  mg/dL   Borderline  259-563  mg/dL   High  >875     mg/dL   Very High Performed at [facility], [address] [place], [place] [address]   CK     Status: Abnormal   Collection Time: 07/04/23  4:02 AM  Result Value Ref Range   Total CK 656 (H) 38 - 234 U/L    Comment: Performed at [facility], [address] [place], [place] [address]  Basic metabolic  panel     Status: Abnormal   Collection Time: 07/04/23  4:02 AM  Result Value Ref Range   Sodium 143 135 - 145 mmol/L   Potassium 3.1 (L) 3.5 - 5.1 mmol/L   Chloride 110 98 - 111 mmol/L   CO2 18 (L) 22 - 32 mmol/L   Glucose, Bld 84 70 - 99 mg/dL    Comment: Glucose reference range applies only to samples taken after fasting for at least 8 hours.   BUN 41 (H) 8 - 23 mg/dL   Creatinine, Ser 4.16 (H) 0.44 - 1.00 mg/dL   Calcium 60.6 8.9 - 30.1 mg/dL   GFR, Estimated 24 (L) >60 mL/min    Comment: (NOTE) Calculated using the CKD-EPI Creatinine Equation (2021)    Anion gap 15 5 - 15    Comment: Performed at [facility], [address] [place], [place] [address]  CBC     Status: Abnormal   Collection Time: 07/04/23  4:02 AM  Result Value Ref Range   WBC 15.6 (H) 4.0 - 10.5 K/uL   RBC 2.99 (L) 3.87 - 5.11 MIL/uL   Hemoglobin 9.1 (L) 12.0 - 15.0 g/dL   HCT 32.3 (L) 55.7 - 32.2 %   MCV 92.3 80.0 - 100.0 fL   MCH 30.4 26.0 - 34.0 pg   MCHC 33.0 30.0 - 36.0 g/dL   RDW 02.5 42.7 - 06.2 %   Platelets 156 150 - 400 K/uL   nRBC 0.0 0.0 - 0.2 %    Comment: Performed at [facility], [address] [place], [place] [address]  Magnesium     Status: Abnormal   Collection Time: 07/04/23  4:02 AM  Result Value Ref Range   Magnesium 1.2 (L) 1.7 - 2.4 mg/dL    Comment: Performed at [facility], [address] [place], [place] [address]  Troponin I (High Sensitivity)     Status: Abnormal   Collection Time: 07/04/23  4:02 AM  Result Value Ref Range   Troponin I (High Sensitivity) 167 (HH) <18 ng/L    Comment: CRITICAL VALUE NOTED. VALUE IS CONSISTENT WITH PREVIOUSLY REPORTED/CALLED VALUE.PMF (NOTE) Elevated high sensitivity troponin I (hsTnI) values and significant  changes across serial measurements may suggest ACS but many other  chronic and acute  conditions are known to elevate hsTnI results.  Refer to the "Links" section for chest pain algorithms and  additional  guidance. Performed at [facility], [address] [place], [place] [address]   CBG monitoring, ED     Status: None   Collection Time: 07/04/23  7:42 AM  Result Value Ref Range   Glucose-Capillary 73 70 - 99 mg/dL    Comment: Glucose reference range applies only to samples taken after fasting for at least 8 hours.  Troponin I (High Sensitivity)     Status: Abnormal   Collection Time: 07/04/23  7:46 AM  Result Value Ref Range   Troponin I (High Sensitivity) 176 (HH) <18 ng/L    Comment: CRITICAL VALUE NOTED. VALUE IS CONSISTENT WITH PREVIOUSLY REPORTED/CALLED VALUE DAS (NOTE) Elevated high sensitivity troponin I (hsTnI) values and significant  changes across serial measurements may suggest ACS but many other  chronic and acute conditions are known to elevate hsTnI results.  Refer to the "Links" section for chest pain algorithms and additional  guidance. Performed at [facility], [address] [place], [place] [address]   Heparin level (unfractionated)     Status: None   Collection Time: 07/04/23 11:09 AM  Result Value Ref Range   Heparin Unfractionated 0.45 0.30 - 0.70 IU/mL    Comment: (NOTE) The clinical reportable range upper limit is being lowered to >1.10 to align with the FDA approved guidance for the current laboratory assay.  If heparin results are below expected values, and patient dosage has  been confirmed, suggest follow up testing of antithrombin III levels. Performed at [facility], [address] [place], [place] [address]   CBG monitoring, ED     Status: Abnormal   Collection Time: 07/04/23 12:06 PM  Result Value Ref Range   Glucose-Capillary 108 (H) 70 - 99 mg/dL    Comment: Glucose reference range applies only to samples taken after fasting for at least 8 hours.  CBG monitoring, ED     Status: Abnormal   Collection Time: 07/04/23  5:32 PM  Result Value Ref Range   Glucose-Capillary 127 (H) 70 - 99 mg/dL     Comment: Glucose reference range applies only to samples taken after fasting for at least 8 hours.  CBG monitoring, ED     Status: None   Collection Time: 07/04/23  6:20 PM  Result Value Ref Range   Glucose-Capillary 98 70 - 99 mg/dL    Comment: Glucose reference range applies only to samples taken after fasting for at least 8 hours.  Heparin level (unfractionated)     Status: None   Collection Time: 07/04/23  8:00 PM  Result Value Ref Range   Heparin Unfractionated 0.41 0.30 - 0.70 IU/mL    Comment: (NOTE) The clinical reportable range upper limit is being lowered to >1.10 to align with the FDA approved guidance for the current laboratory assay.  If heparin results are below expected values, and patient dosage has  been confirmed, suggest follow up testing of antithrombin III levels. Performed at [facility], [address] [place], [place] [address]   CBC     Status: Abnormal   Collection Time: 07/05/23  5:50 AM  Result Value Ref Range   WBC 8.8 4.0 - 10.5 K/uL   RBC 3.02 (L) 3.87 - 5.11 MIL/uL   Hemoglobin 9.0 (L) 12.0 - 15.0 g/dL   HCT 65.7 (L) 84.6 - 96.2 %   MCV 90.4 80.0 - 100.0 fL  MCH 29.8 26.0 - 34.0 pg   MCHC 33.0 30.0 - 36.0 g/dL   RDW 16.1 09.6 - 04.5 %   Platelets 129 (L) 150 - 400 K/uL   nRBC 0.0 0.0 - 0.2 %    Comment: Performed at [facility], [address] [place], [place] [address]  Basic metabolic panel     Status: Abnormal   Collection Time: 07/05/23  5:50 AM  Result Value Ref Range   Sodium 141 135 - 145 mmol/L   Potassium 3.9 3.5 - 5.1 mmol/L   Chloride 113 (H) 98 - 111 mmol/L   CO2 20 (L) 22 - 32 mmol/L   Glucose, Bld 77 70 - 99 mg/dL    Comment: Glucose reference range applies only to samples taken after fasting for at least 8 hours.   BUN 33 (H) 8 - 23 mg/dL   Creatinine, Ser 1.91 (H) 0.44 - 1.00 mg/dL   Calcium 9.9 8.9 - 47.8 mg/dL   GFR, Estimated 28 (L) >60 mL/min    Comment: (NOTE) Calculated using the CKD-EPI  Creatinine Equation (2021)    Anion gap 8 5 - 15    Comment: Performed at [facility], [address] [place], [place] [address]  Magnesium     Status: Abnormal   Collection Time: 07/05/23  5:50 AM  Result Value Ref Range   Magnesium 2.5 (H) 1.7 - 2.4 mg/dL    Comment: Performed at [facility], [address] [place], [place] [address]  Heparin level (unfractionated)     Status: None   Collection Time: 07/05/23  5:50 AM  Result Value Ref Range   Heparin Unfractionated 0.46 0.30 - 0.70 IU/mL    Comment: (NOTE) The clinical reportable range upper limit is being lowered to >1.10 to align with the FDA approved guidance for the current laboratory assay.  If heparin results are below expected values, and patient dosage has  been confirmed, suggest follow up testing of antithrombin III levels. Performed at [facility], [address] [place], [place] [address]   ECHOCARDIOGRAM COMPLETE     Status: None   Collection Time: 07/05/23  2:09 PM  Result Value Ref Range   Weight 2,316.8 oz   Height 63 in   BP 150/55 mmHg   Ao pk vel 1.33 m/s   AR max vel 1.79 cm2   AV Peak grad 7.1 mmHg   S' Lateral 3.20 cm   Area-P 1/2 2.59 cm2   Est EF 60 - 65%   Basic metabolic panel     Status: Abnormal   Collection Time: 07/06/23  5:07 AM  Result Value Ref Range   Sodium 140 135 - 145 mmol/L   Potassium 3.8 3.5 - 5.1 mmol/L   Chloride 112 (H) 98 - 111 mmol/L   CO2 23 22 - 32 mmol/L   Glucose, Bld 104 (H) 70 - 99 mg/dL    Comment: Glucose reference range applies only to samples taken after fasting for at least 8 hours.   BUN 29 (H) 8 - 23 mg/dL   Creatinine, Ser 9.62 (H) 0.44 - 1.00 mg/dL   Calcium 9.6 8.9 - 95.2 mg/dL   GFR, Estimated 34 (L) >60 mL/min    Comment: (NOTE) Calculated using the CKD-EPI Creatinine Equation (2021)    Anion gap 5 5 - 15    Comment: Performed at [facility], [address], [place], [place] [address]  Magnesium      Status: None  Collection Time: 07/06/23  5:07 AM  Result Value Ref Range   Magnesium 1.9 1.7 - 2.4 mg/dL    Comment: Performed at [facility], [address] [place], [place] [address]  Heparin level (unfractionated)     Status: None   Collection Time: 07/06/23  5:07 AM  Result Value Ref Range   Heparin Unfractionated 0.37 0.30 - 0.70 IU/mL    Comment: (NOTE) The clinical reportable range upper limit is being lowered to >1.10 to align with the FDA approved guidance for the current laboratory assay.  If heparin results are below expected values, and patient dosage has  been confirmed, suggest follow up testing of antithrombin III levels. Performed at [facility], [address] [place], [place] [address]   CBC     Status: Abnormal   Collection Time: 07/06/23  5:07 AM  Result Value Ref Range   WBC 7.5 4.0 - 10.5 K/uL   RBC 3.06 (L) 3.87 - 5.11 MIL/uL   Hemoglobin 9.3 (L) 12.0 - 15.0 g/dL   HCT 86.5 (L) 78.4 - 69.6 %   MCV 89.9 80.0 - 100.0 fL   MCH 30.4 26.0 - 34.0 pg   MCHC 33.8 30.0 - 36.0 g/dL   RDW 29.5 28.4 - 13.2 %   Platelets 120 (L) 150 - 400 K/uL   nRBC 0.0 0.0 - 0.2 %    Comment: Performed at [facility], [address] [place], [place] [address]  CK     Status: Abnormal   Collection Time: 07/06/23  5:07 AM  Result Value Ref Range   Total CK 244 (H) 38 - 234 U/L    Comment: Performed at [facility], [address] [place], [place] [address]  Basic metabolic panel     Status: Abnormal   Collection Time: 07/07/23  6:17 AM  Result Value Ref Range   Sodium 140 135 - 145 mmol/L   Potassium 4.0 3.5 - 5.1 mmol/L   Chloride 110 98 - 111 mmol/L   CO2 24 22 - 32 mmol/L   Glucose, Bld 93 70 - 99 mg/dL    Comment: Glucose reference range applies only to samples taken after fasting for at least 8 hours.   BUN 33 (H) 8 - 23 mg/dL   Creatinine, Ser 6.64 (H) 0.44 - 1.00 mg/dL   Calcium 9.9 8.9 - 40.3 mg/dL   GFR, Estimated 32  (L) >60 mL/min    Comment: (NOTE) Calculated using the CKD-EPI Creatinine Equation (2021)    Anion gap 6 5 - 15    Comment: Performed at [facility], [address] [place], [place] [address]  Magnesium     Status: Abnormal   Collection Time: 07/07/23  6:17 AM  Result Value Ref Range   Magnesium 1.5 (L) 1.7 - 2.4 mg/dL    Comment: Performed at [facility], [address] [place], [place] [address]  CBC     Status: Abnormal   Collection Time: 07/07/23  6:17 AM  Result Value Ref Range   WBC 7.3 4.0 - 10.5 K/uL   RBC 3.22 (L) 3.87 - 5.11 MIL/uL   Hemoglobin 9.7 (L) 12.0 - 15.0 g/dL   HCT 75.6 (L) 43.3 - 29.5 %   MCV 89.1 80.0 - 100.0 fL   MCH 30.1 26.0 - 34.0 pg   MCHC 33.8 30.0 - 36.0 g/dL   RDW 18.8 41.6 - 60.6 %   Platelets 118 (L) 150 - 400 K/uL  nRBC 0.0 0.0 - 0.2 %    Comment: Performed at [facility], [address] [place], [place] [address]  Basic metabolic panel     Status: Abnormal   Collection Time: 07/08/23  5:51 AM  Result Value Ref Range   Sodium 140 135 - 145 mmol/L   Potassium 3.9 3.5 - 5.1 mmol/L   Chloride 106 98 - 111 mmol/L   CO2 26 22 - 32 mmol/L   Glucose, Bld 96 70 - 99 mg/dL    Comment: Glucose reference range applies only to samples taken after fasting for at least 8 hours.   BUN 34 (H) 8 - 23 mg/dL   Creatinine, Ser 6.04 (H) 0.44 - 1.00 mg/dL   Calcium 54.0 8.9 - 98.1 mg/dL   GFR, Estimated 37 (L) >60 mL/min    Comment: (NOTE) Calculated using the CKD-EPI Creatinine Equation (2021)    Anion gap 8 5 - 15    Comment: Performed at [facility], [address] [place], [place] [address]  Magnesium     Status: Abnormal   Collection Time: 07/08/23  5:51 AM  Result Value Ref Range   Magnesium 1.5 (L) 1.7 - 2.4 mg/dL    Comment: Performed at [facility], [address] [place], [place] [address]  Basic metabolic panel     Status: Abnormal   Collection Time: 07/09/23  5:56 AM  Result Value Ref  Range   Sodium 138 135 - 145 mmol/L   Potassium 4.3 3.5 - 5.1 mmol/L   Chloride 107 98 - 111 mmol/L   CO2 27 22 - 32 mmol/L   Glucose, Bld 106 (H) 70 - 99 mg/dL    Comment: Glucose reference range applies only to samples taken after fasting for at least 8 hours.   BUN 42 (H) 8 - 23 mg/dL   Creatinine, Ser 2.13 (H) 0.44 - 1.00 mg/dL   Calcium 08.6 8.9 - 57.8 mg/dL   GFR, Estimated 33 (L) >60 mL/min    Comment: (NOTE) Calculated using the CKD-EPI Creatinine Equation (2021)    Anion gap 4 (L) 5 - 15    Comment: Performed at [facility], [address], [place], [place] [address]  Magnesium     Status: None   Collection Time: 07/09/23  5:56 AM  Result Value Ref Range   Magnesium 2.4 1.7 - 2.4 mg/dL    Comment: Performed at [facility], [address] [place], [place] [address]      Assessment & Plan:  Proceed with palliative care.   Problem List Items Addressed This Visit       Nervous and Auditory   Moderate dementia (HCC) - Primary     Other   Fall    Return if symptoms worsen or fail to improve, for first available with TJ.   Total time spent: 25 minutes  [employer], NP  07/11/2023   This document may have been prepared by Dragon Voice Recognition software and as such may include unintentional dictation errors.

## 2023-07-11 NOTE — Transitions of Care (Post Inpatient/ED Visit) (Signed)
 07/11/2023  Name: [name] MRN: [id number] DOB: [birth year]  Today's TOC FU Call Status: Today's TOC FU Call Status:: Successful TOC FU Call Completed Unsuccessful Call (1st Attempt) Date: 07/10/23 [facility] FU Call Complete Date: 07/11/23 Patient's Name and Date of Birth confirmed.  Transition Care Management Follow-up Telephone Call Date of Discharge: 07/09/23 Discharge Facility: [facility]) Type of Discharge: Inpatient Admission Primary Inpatient Discharge Diagnosis:: NSTEMI (non-ST elevated myocardial infarction)  Fall How have you been since you were released from the hospital?: Better Any questions or concerns?: No  Items Reviewed: Did you receive and understand the discharge instructions provided?: Yes Medications obtained,verified, and reconciled?: Yes (Medications Reviewed) (Medication reconciliation completed based on recent discharge summary Patient taking medications as instructed and is aware of any changes or dosage adjustments medication regimen. Patient denies questions and reports no barriers to medication adherence) Any new allergies since your discharge?: Yes Dietary orders reviewed?: Yes Type of Diet Ordered:: Reg Heart Healthy She has 1 Ensure a day to supplement Do you have support at home?: Yes People in Home: alone Name of Support/Comfort Primary Source: Son Jonny Ruiz  Medications Reviewed Today: Medications Reviewed Today     Reviewed by Johnnette Barrios, RN (Registered Nurse) on 07/11/23 at 1224  Med List Status: <None>   Medication Order Taking? Sig Documenting Provider Last Dose Status Informant  amLODipine (NORVASC) 10 MG tablet [id number] Yes TAKE 1 TABLET(10 MG) BY MOUTH DAILY Sherron Monday, MD Taking Active Child, Pharmacy Records  Ascorbic Acid (VITAMIN C PO) [id number] Yes Take 1 tablet by mouth daily. [provider] Taking Active Child, Pharmacy Records  aspirin EC 81 MG tablet [id number] Yes Take 81 mg by  mouth daily. Swallow whole. [provider] Taking Active Child, Pharmacy Records  Cholecalciferol (VITAMIN D3) 5000 units TABS [id number] Yes Take 1 tablet by mouth daily. [provider] Taking Active Child, Pharmacy Records  feeding supplement (ENSURE ENLIVE / ENSURE PLUS) LIQD [id number] Yes Take 237 mLs by mouth 2 (two) times daily between meals. Arnetha Courser, MD Taking Active            Med Note Sharon Seller, Thomasenia Dowse L   Fri Jul 11, 2023 12:24 PM) States she takes 1 x day   furosemide (LASIX) 20 MG tablet [id number] Yes TAKE 1 TABLET(20 MG) BY MOUTH DAILY AS NEEDED  Patient taking differently: Take 20 mg by mouth daily as needed for fluid or edema.   Sherron Monday, MD Taking Active Child, Pharmacy Records  gabapentin (NEURONTIN) 300 MG capsule [id number]  Take 1 capsule (300 mg total) by mouth daily in the afternoon.  Patient not taking: Reported on 07/04/2023   Sherron Monday, MD  Expired 07/10/23 2359 Child, Pharmacy Records  lidocaine (LIDODERM) 5 % [id number] Yes Place 1 patch onto the skin at bedtime. Remove & Discard patch within 12 hours or as directed by MD Arnetha Courser, MD Taking Active   pioglitazone (ACTOS) 15 MG tablet [id number] Yes TAKE 1 TABLET BY MOUTH EVERY MORNING Sherron Monday, MD Taking Active Child, Pharmacy Records  rosuvastatin (CRESTOR) 40 MG tablet [id number] Yes TAKE 1 TABLET BY MOUTH EVERY DAY Sherron Monday, MD Taking Active Child, Pharmacy Records  sertraline (ZOLOFT) 50 MG tablet [id number] Yes TAKE 1 TABLET(50 MG) BY MOUTH EVERY MORNING Sherron Monday, MD Taking Active Child, Pharmacy Records  tiZANidine (ZANAFLEX) 4 MG capsule [id number] Yes Take 4 mg by mouth 3 (three) times daily as needed for muscle spasms. [provider] Taking Active Child, Pharmacy Records          Medication reconciliation / review completed based on most recent discharge summary and EHR medication list. Confirmed patient is taking all newly  prescribed medications as instructed (any discrepancies are noted in review section)   Patient / Caregiver is aware of any changes to and / or  any dosage adjustments to medication regimen. Patient/ Caregiver denies questions at this time and reports no barriers to medication adherence.   Medication updates and changes reviewed with patient  START taking: feeding supplement lidocaine (LIDODERM) STOP taking: carvedilol 6.25 MG tablet (COREG) chlorthalidone 25 MG tablet (HYGROTON) lisinopril 10 MG tablet (ZESTRIL)  Home Care and Equipment/Supplies: Were Home Health Services Ordered?: No Any new equipment or medical supplies ordered?: No  Functional Questionnaire: Do you need assistance with bathing/showering or dressing?: No Do you need assistance with meal preparation?: No Do you need assistance with eating?: No Do you have difficulty maintaining continence: No Do you need assistance with getting out of bed/getting out of a chair/moving?: No (Uses walker) Do you have difficulty managing or taking your medications?: Yes (Son set up pill boxes)  Follow up appointments reviewed: PCP Follow-up appointment confirmed?: Yes Date of PCP follow-up appointment?: 07/11/23 Follow-up Provider: Sherron Monday, MD Specialist Hospital Follow-up appointment confirmed?: NA Do you need transportation to your follow-up appointment?: No (Son will transport) Do you understand care options if your condition(s) worsen?: Yes-patient verbalized understanding  SDOH Interventions Today    Flowsheet Row Most Recent Value  SDOH Interventions   Food Insecurity Interventions Intervention Not Indicated  Housing Interventions Intervention Not Indicated  Transportation Interventions Intervention Not Indicated, Patient Resources (Friends/Family)  Utilities Interventions Intervention Not Indicated      Interventions Today    Flowsheet Row Most Recent Value  Chronic Disease   Chronic disease during today's  visit Diabetes, Congestive Heart Failure (CHF)  General Interventions   General Interventions Discussed/Reviewed General Interventions Reviewed, General Interventions Discussed, Sick Day Rules, Doctor Visits  Doctor Visits Discussed/Reviewed Doctor Visits Discussed, Doctor Visits Reviewed, PCP  PCP/Specialist Visits Compliance with follow-up visit  Exercise Interventions   Exercise Discussed/Reviewed Exercise Discussed, Physical Activity  Physical Activity Discussed/Reviewed Physical Activity Discussed, Physical Activity Reviewed  Education Interventions   Education Provided Provided Education  Provided Verbal Education On Eye Care, Nutrition, When to see the doctor, Medication  Nutrition Interventions   Nutrition Discussed/Reviewed Nutrition Discussed, Supplemental nutrition, Nutrition Reviewed, Carbohydrate meal planning, Adding fruits and vegetables, Decreasing salt  Pharmacy Interventions   Pharmacy Dicussed/Reviewed Pharmacy Topics Discussed, Pharmacy Topics Reviewed, Medications and their functions, Medication Adherence  Safety Interventions   Safety Discussed/Reviewed Safety Discussed, Safety Reviewed, Fall Risk        Benefits reviewed  Based on current information and Insurance plan -Reviewed benefits accessible to patient, including details about eligibility options for care and  available value based care options  if any areas of needs were identified.  Reviewed patient/  caregiver's ability to access and / or  ability with navigating the benefits system..Amb Referral made if indicted , refer to orders section of note for details   Reviewed goals for care  Patient / Caregiver was encouraged to make informed decisions about their care, actively participate in managing their health condition, and implement lifestyle changes as needed to promote independence and self-management of health care. There were no reported  barriers to care.   TOC program  Patient is at  risk for  readmission and /  or has history of  high utilization  Discussed VBCI  TOC program and weekly calls to patient to assess condition/status, medication management  and provide support/education as indicated . Patient  and / or Caregive voiced understanding and declined enrollment in the 30-day TOC Program at this time . She feels she is doing well at home Her son is a strong support . She is aware of fall precautions She uses a pill  box for medication compliance She states her vision is poor due to Cataracts and plans on Surgery next month. She weighs herself daily weight today 144 ( down 2 lbs)  She does not recall her B/P but stated it was "not high" when  her son checked today    Follow-up Plan Post Hospital visit 3/29/ 25  Please ask your Primary MD to get all Hospital records sent to his/her office. Take all your medications and your discharge paperwork with you for your next visit with your Primary MD-   Be sure to request your Primary MD to go over all hospital tests and procedure/radiological results at the follow up appointment ,   2.  Patient was encouraged to Contact PCP with any questions or concerns regarding ongoing medical care, any difficulty obtaining or picking up prescriptions, any changes or worsening in condition including signs / symptoms not relieved  with interventions Patient had no additional questions or concerns at this time.    Susa Loffler , BSN, RN [facility] [place] [facility]   VBCI-Population Health RN Care Manager Direct Dial [phone]  Fax: [phone] Website: [email or web address]

## 2023-07-14 DIAGNOSIS — F0390 Unspecified dementia without behavioral disturbance: Secondary | ICD-10-CM | POA: Insufficient documentation

## 2023-07-14 DIAGNOSIS — F03B0 Unspecified dementia, moderate, without behavioral disturbance, psychotic disturbance, mood disturbance, and anxiety: Secondary | ICD-10-CM | POA: Insufficient documentation

## 2023-07-15 DIAGNOSIS — F319 Bipolar disorder, unspecified: Secondary | ICD-10-CM | POA: Diagnosis not present

## 2023-07-15 DIAGNOSIS — S22088D Other fracture of T11-T12 vertebra, subsequent encounter for fracture with routine healing: Secondary | ICD-10-CM | POA: Diagnosis not present

## 2023-07-15 DIAGNOSIS — I2510 Atherosclerotic heart disease of native coronary artery without angina pectoris: Secondary | ICD-10-CM | POA: Diagnosis not present

## 2023-07-15 DIAGNOSIS — E1122 Type 2 diabetes mellitus with diabetic chronic kidney disease: Secondary | ICD-10-CM | POA: Diagnosis not present

## 2023-07-15 DIAGNOSIS — I5032 Chronic diastolic (congestive) heart failure: Secondary | ICD-10-CM | POA: Diagnosis not present

## 2023-07-15 DIAGNOSIS — N1832 Chronic kidney disease, stage 3b: Secondary | ICD-10-CM | POA: Diagnosis not present

## 2023-07-15 DIAGNOSIS — I130 Hypertensive heart and chronic kidney disease with heart failure and stage 1 through stage 4 chronic kidney disease, or unspecified chronic kidney disease: Secondary | ICD-10-CM | POA: Diagnosis not present

## 2023-07-15 DIAGNOSIS — I214 Non-ST elevation (NSTEMI) myocardial infarction: Secondary | ICD-10-CM | POA: Diagnosis not present

## 2023-07-15 DIAGNOSIS — N179 Acute kidney failure, unspecified: Secondary | ICD-10-CM | POA: Diagnosis not present

## 2023-07-19 ENCOUNTER — Other Ambulatory Visit: Payer: Self-pay | Admitting: Internal Medicine

## 2023-07-19 DIAGNOSIS — E782 Mixed hyperlipidemia: Secondary | ICD-10-CM

## 2023-07-21 DIAGNOSIS — I130 Hypertensive heart and chronic kidney disease with heart failure and stage 1 through stage 4 chronic kidney disease, or unspecified chronic kidney disease: Secondary | ICD-10-CM | POA: Diagnosis not present

## 2023-07-21 DIAGNOSIS — I5032 Chronic diastolic (congestive) heart failure: Secondary | ICD-10-CM | POA: Diagnosis not present

## 2023-07-21 DIAGNOSIS — I2510 Atherosclerotic heart disease of native coronary artery without angina pectoris: Secondary | ICD-10-CM | POA: Diagnosis not present

## 2023-07-21 DIAGNOSIS — F319 Bipolar disorder, unspecified: Secondary | ICD-10-CM | POA: Diagnosis not present

## 2023-07-21 DIAGNOSIS — E1122 Type 2 diabetes mellitus with diabetic chronic kidney disease: Secondary | ICD-10-CM | POA: Diagnosis not present

## 2023-07-21 DIAGNOSIS — I214 Non-ST elevation (NSTEMI) myocardial infarction: Secondary | ICD-10-CM | POA: Diagnosis not present

## 2023-07-21 DIAGNOSIS — N179 Acute kidney failure, unspecified: Secondary | ICD-10-CM | POA: Diagnosis not present

## 2023-07-21 DIAGNOSIS — S22088D Other fracture of T11-T12 vertebra, subsequent encounter for fracture with routine healing: Secondary | ICD-10-CM | POA: Diagnosis not present

## 2023-07-21 DIAGNOSIS — N1832 Chronic kidney disease, stage 3b: Secondary | ICD-10-CM | POA: Diagnosis not present

## 2023-07-22 DIAGNOSIS — I5032 Chronic diastolic (congestive) heart failure: Secondary | ICD-10-CM | POA: Diagnosis not present

## 2023-07-22 DIAGNOSIS — S22088D Other fracture of T11-T12 vertebra, subsequent encounter for fracture with routine healing: Secondary | ICD-10-CM | POA: Diagnosis not present

## 2023-07-22 DIAGNOSIS — I130 Hypertensive heart and chronic kidney disease with heart failure and stage 1 through stage 4 chronic kidney disease, or unspecified chronic kidney disease: Secondary | ICD-10-CM | POA: Diagnosis not present

## 2023-07-22 DIAGNOSIS — I214 Non-ST elevation (NSTEMI) myocardial infarction: Secondary | ICD-10-CM | POA: Diagnosis not present

## 2023-07-22 DIAGNOSIS — F319 Bipolar disorder, unspecified: Secondary | ICD-10-CM | POA: Diagnosis not present

## 2023-07-22 DIAGNOSIS — N1832 Chronic kidney disease, stage 3b: Secondary | ICD-10-CM | POA: Diagnosis not present

## 2023-07-22 DIAGNOSIS — E1122 Type 2 diabetes mellitus with diabetic chronic kidney disease: Secondary | ICD-10-CM | POA: Diagnosis not present

## 2023-07-22 DIAGNOSIS — N179 Acute kidney failure, unspecified: Secondary | ICD-10-CM | POA: Diagnosis not present

## 2023-07-22 DIAGNOSIS — I2510 Atherosclerotic heart disease of native coronary artery without angina pectoris: Secondary | ICD-10-CM | POA: Diagnosis not present

## 2023-07-23 ENCOUNTER — Telehealth: Payer: Self-pay | Admitting: Internal Medicine

## 2023-07-23 ENCOUNTER — Ambulatory Visit: Admit: 2023-07-23 | Payer: Medicare HMO | Admitting: Ophthalmology

## 2023-07-23 SURGERY — PHACOEMULSIFICATION, CATARACT, WITH IOL INSERTION
Anesthesia: Topical | Laterality: Left

## 2023-07-23 NOTE — Telephone Encounter (Signed)
 Shireen, OT with [facility], called needing verbal orders for OT for 1w8. Verbal orders given.

## 2023-07-28 DIAGNOSIS — I5032 Chronic diastolic (congestive) heart failure: Secondary | ICD-10-CM | POA: Diagnosis not present

## 2023-07-28 DIAGNOSIS — S22088D Other fracture of T11-T12 vertebra, subsequent encounter for fracture with routine healing: Secondary | ICD-10-CM | POA: Diagnosis not present

## 2023-07-28 DIAGNOSIS — E1122 Type 2 diabetes mellitus with diabetic chronic kidney disease: Secondary | ICD-10-CM | POA: Diagnosis not present

## 2023-07-28 DIAGNOSIS — N1832 Chronic kidney disease, stage 3b: Secondary | ICD-10-CM | POA: Diagnosis not present

## 2023-07-28 DIAGNOSIS — I214 Non-ST elevation (NSTEMI) myocardial infarction: Secondary | ICD-10-CM | POA: Diagnosis not present

## 2023-07-28 DIAGNOSIS — F319 Bipolar disorder, unspecified: Secondary | ICD-10-CM | POA: Diagnosis not present

## 2023-07-28 DIAGNOSIS — I130 Hypertensive heart and chronic kidney disease with heart failure and stage 1 through stage 4 chronic kidney disease, or unspecified chronic kidney disease: Secondary | ICD-10-CM | POA: Diagnosis not present

## 2023-07-28 DIAGNOSIS — N179 Acute kidney failure, unspecified: Secondary | ICD-10-CM | POA: Diagnosis not present

## 2023-07-28 DIAGNOSIS — I2510 Atherosclerotic heart disease of native coronary artery without angina pectoris: Secondary | ICD-10-CM | POA: Diagnosis not present

## 2023-07-30 ENCOUNTER — Telehealth: Payer: Self-pay | Admitting: Internal Medicine

## 2023-07-30 DIAGNOSIS — N179 Acute kidney failure, unspecified: Secondary | ICD-10-CM | POA: Diagnosis not present

## 2023-07-30 DIAGNOSIS — I5032 Chronic diastolic (congestive) heart failure: Secondary | ICD-10-CM | POA: Diagnosis not present

## 2023-07-30 DIAGNOSIS — F319 Bipolar disorder, unspecified: Secondary | ICD-10-CM | POA: Diagnosis not present

## 2023-07-30 DIAGNOSIS — E1122 Type 2 diabetes mellitus with diabetic chronic kidney disease: Secondary | ICD-10-CM | POA: Diagnosis not present

## 2023-07-30 DIAGNOSIS — I214 Non-ST elevation (NSTEMI) myocardial infarction: Secondary | ICD-10-CM | POA: Diagnosis not present

## 2023-07-30 DIAGNOSIS — N1832 Chronic kidney disease, stage 3b: Secondary | ICD-10-CM | POA: Diagnosis not present

## 2023-07-30 DIAGNOSIS — I130 Hypertensive heart and chronic kidney disease with heart failure and stage 1 through stage 4 chronic kidney disease, or unspecified chronic kidney disease: Secondary | ICD-10-CM | POA: Diagnosis not present

## 2023-07-30 DIAGNOSIS — S22088D Other fracture of T11-T12 vertebra, subsequent encounter for fracture with routine healing: Secondary | ICD-10-CM | POA: Diagnosis not present

## 2023-07-30 DIAGNOSIS — I2510 Atherosclerotic heart disease of native coronary artery without angina pectoris: Secondary | ICD-10-CM | POA: Diagnosis not present

## 2023-07-30 NOTE — Telephone Encounter (Signed)
 Melissa, OT with [facility], called to report a missed OT visit this week.

## 2023-07-31 ENCOUNTER — Telehealth: Payer: Self-pay

## 2023-07-31 NOTE — Telephone Encounter (Signed)
 [name] with [facility]  called asking for verbal okay for the Recert RN visits and they are asking for you to put in order for neurology so she can get a DX of Dementia

## 2023-08-04 DIAGNOSIS — N184 Chronic kidney disease, stage 4 (severe): Secondary | ICD-10-CM | POA: Diagnosis not present

## 2023-08-04 DIAGNOSIS — I10 Essential (primary) hypertension: Secondary | ICD-10-CM | POA: Diagnosis not present

## 2023-08-04 DIAGNOSIS — N2581 Secondary hyperparathyroidism of renal origin: Secondary | ICD-10-CM | POA: Diagnosis not present

## 2023-08-04 DIAGNOSIS — D631 Anemia in chronic kidney disease: Secondary | ICD-10-CM | POA: Diagnosis not present

## 2023-08-04 DIAGNOSIS — R8290 Unspecified abnormal findings in urine: Secondary | ICD-10-CM | POA: Diagnosis not present

## 2023-08-04 DIAGNOSIS — E1122 Type 2 diabetes mellitus with diabetic chronic kidney disease: Secondary | ICD-10-CM | POA: Diagnosis not present

## 2023-08-04 NOTE — Telephone Encounter (Signed)
 Spoke with sandra and informed her the referral was already done and the patients family could call back about the location of the referral if they had questions to the front desk.

## 2023-08-05 DIAGNOSIS — E1122 Type 2 diabetes mellitus with diabetic chronic kidney disease: Secondary | ICD-10-CM | POA: Diagnosis not present

## 2023-08-05 DIAGNOSIS — F319 Bipolar disorder, unspecified: Secondary | ICD-10-CM | POA: Diagnosis not present

## 2023-08-05 DIAGNOSIS — N179 Acute kidney failure, unspecified: Secondary | ICD-10-CM | POA: Diagnosis not present

## 2023-08-05 DIAGNOSIS — I130 Hypertensive heart and chronic kidney disease with heart failure and stage 1 through stage 4 chronic kidney disease, or unspecified chronic kidney disease: Secondary | ICD-10-CM | POA: Diagnosis not present

## 2023-08-05 DIAGNOSIS — I2510 Atherosclerotic heart disease of native coronary artery without angina pectoris: Secondary | ICD-10-CM | POA: Diagnosis not present

## 2023-08-05 DIAGNOSIS — N1832 Chronic kidney disease, stage 3b: Secondary | ICD-10-CM | POA: Diagnosis not present

## 2023-08-05 DIAGNOSIS — S22088D Other fracture of T11-T12 vertebra, subsequent encounter for fracture with routine healing: Secondary | ICD-10-CM | POA: Diagnosis not present

## 2023-08-05 DIAGNOSIS — I5032 Chronic diastolic (congestive) heart failure: Secondary | ICD-10-CM | POA: Diagnosis not present

## 2023-08-05 DIAGNOSIS — I214 Non-ST elevation (NSTEMI) myocardial infarction: Secondary | ICD-10-CM | POA: Diagnosis not present

## 2023-08-06 DIAGNOSIS — N1832 Chronic kidney disease, stage 3b: Secondary | ICD-10-CM | POA: Diagnosis not present

## 2023-08-06 DIAGNOSIS — I2510 Atherosclerotic heart disease of native coronary artery without angina pectoris: Secondary | ICD-10-CM | POA: Diagnosis not present

## 2023-08-06 DIAGNOSIS — E1122 Type 2 diabetes mellitus with diabetic chronic kidney disease: Secondary | ICD-10-CM | POA: Diagnosis not present

## 2023-08-06 DIAGNOSIS — I5032 Chronic diastolic (congestive) heart failure: Secondary | ICD-10-CM | POA: Diagnosis not present

## 2023-08-06 DIAGNOSIS — I214 Non-ST elevation (NSTEMI) myocardial infarction: Secondary | ICD-10-CM | POA: Diagnosis not present

## 2023-08-06 DIAGNOSIS — I130 Hypertensive heart and chronic kidney disease with heart failure and stage 1 through stage 4 chronic kidney disease, or unspecified chronic kidney disease: Secondary | ICD-10-CM | POA: Diagnosis not present

## 2023-08-06 DIAGNOSIS — F319 Bipolar disorder, unspecified: Secondary | ICD-10-CM | POA: Diagnosis not present

## 2023-08-06 DIAGNOSIS — N179 Acute kidney failure, unspecified: Secondary | ICD-10-CM | POA: Diagnosis not present

## 2023-08-06 DIAGNOSIS — S22088D Other fracture of T11-T12 vertebra, subsequent encounter for fracture with routine healing: Secondary | ICD-10-CM | POA: Diagnosis not present

## 2023-08-07 DIAGNOSIS — I5032 Chronic diastolic (congestive) heart failure: Secondary | ICD-10-CM | POA: Diagnosis not present

## 2023-08-07 DIAGNOSIS — N1832 Chronic kidney disease, stage 3b: Secondary | ICD-10-CM | POA: Diagnosis not present

## 2023-08-07 DIAGNOSIS — I214 Non-ST elevation (NSTEMI) myocardial infarction: Secondary | ICD-10-CM | POA: Diagnosis not present

## 2023-08-07 DIAGNOSIS — I130 Hypertensive heart and chronic kidney disease with heart failure and stage 1 through stage 4 chronic kidney disease, or unspecified chronic kidney disease: Secondary | ICD-10-CM | POA: Diagnosis not present

## 2023-08-07 DIAGNOSIS — S22088D Other fracture of T11-T12 vertebra, subsequent encounter for fracture with routine healing: Secondary | ICD-10-CM | POA: Diagnosis not present

## 2023-08-07 DIAGNOSIS — I2510 Atherosclerotic heart disease of native coronary artery without angina pectoris: Secondary | ICD-10-CM | POA: Diagnosis not present

## 2023-08-07 DIAGNOSIS — F319 Bipolar disorder, unspecified: Secondary | ICD-10-CM | POA: Diagnosis not present

## 2023-08-07 DIAGNOSIS — N179 Acute kidney failure, unspecified: Secondary | ICD-10-CM | POA: Diagnosis not present

## 2023-08-07 DIAGNOSIS — E1122 Type 2 diabetes mellitus with diabetic chronic kidney disease: Secondary | ICD-10-CM | POA: Diagnosis not present

## 2023-08-11 DIAGNOSIS — F319 Bipolar disorder, unspecified: Secondary | ICD-10-CM | POA: Diagnosis not present

## 2023-08-11 DIAGNOSIS — I214 Non-ST elevation (NSTEMI) myocardial infarction: Secondary | ICD-10-CM | POA: Diagnosis not present

## 2023-08-11 DIAGNOSIS — I2510 Atherosclerotic heart disease of native coronary artery without angina pectoris: Secondary | ICD-10-CM | POA: Diagnosis not present

## 2023-08-11 DIAGNOSIS — I130 Hypertensive heart and chronic kidney disease with heart failure and stage 1 through stage 4 chronic kidney disease, or unspecified chronic kidney disease: Secondary | ICD-10-CM | POA: Diagnosis not present

## 2023-08-11 DIAGNOSIS — S22088D Other fracture of T11-T12 vertebra, subsequent encounter for fracture with routine healing: Secondary | ICD-10-CM | POA: Diagnosis not present

## 2023-08-11 DIAGNOSIS — E1122 Type 2 diabetes mellitus with diabetic chronic kidney disease: Secondary | ICD-10-CM | POA: Diagnosis not present

## 2023-08-11 DIAGNOSIS — N1832 Chronic kidney disease, stage 3b: Secondary | ICD-10-CM | POA: Diagnosis not present

## 2023-08-11 DIAGNOSIS — N179 Acute kidney failure, unspecified: Secondary | ICD-10-CM | POA: Diagnosis not present

## 2023-08-11 DIAGNOSIS — I5032 Chronic diastolic (congestive) heart failure: Secondary | ICD-10-CM | POA: Diagnosis not present

## 2023-08-12 DIAGNOSIS — I2510 Atherosclerotic heart disease of native coronary artery without angina pectoris: Secondary | ICD-10-CM | POA: Diagnosis not present

## 2023-08-12 DIAGNOSIS — I214 Non-ST elevation (NSTEMI) myocardial infarction: Secondary | ICD-10-CM | POA: Diagnosis not present

## 2023-08-12 DIAGNOSIS — E1122 Type 2 diabetes mellitus with diabetic chronic kidney disease: Secondary | ICD-10-CM | POA: Diagnosis not present

## 2023-08-12 DIAGNOSIS — N179 Acute kidney failure, unspecified: Secondary | ICD-10-CM | POA: Diagnosis not present

## 2023-08-12 DIAGNOSIS — I5032 Chronic diastolic (congestive) heart failure: Secondary | ICD-10-CM | POA: Diagnosis not present

## 2023-08-12 DIAGNOSIS — I130 Hypertensive heart and chronic kidney disease with heart failure and stage 1 through stage 4 chronic kidney disease, or unspecified chronic kidney disease: Secondary | ICD-10-CM | POA: Diagnosis not present

## 2023-08-12 DIAGNOSIS — F319 Bipolar disorder, unspecified: Secondary | ICD-10-CM | POA: Diagnosis not present

## 2023-08-12 DIAGNOSIS — N1832 Chronic kidney disease, stage 3b: Secondary | ICD-10-CM | POA: Diagnosis not present

## 2023-08-12 DIAGNOSIS — S22088D Other fracture of T11-T12 vertebra, subsequent encounter for fracture with routine healing: Secondary | ICD-10-CM | POA: Diagnosis not present

## 2023-08-14 DIAGNOSIS — N1832 Chronic kidney disease, stage 3b: Secondary | ICD-10-CM | POA: Diagnosis not present

## 2023-08-14 DIAGNOSIS — I214 Non-ST elevation (NSTEMI) myocardial infarction: Secondary | ICD-10-CM | POA: Diagnosis not present

## 2023-08-14 DIAGNOSIS — I5032 Chronic diastolic (congestive) heart failure: Secondary | ICD-10-CM | POA: Diagnosis not present

## 2023-08-14 DIAGNOSIS — I130 Hypertensive heart and chronic kidney disease with heart failure and stage 1 through stage 4 chronic kidney disease, or unspecified chronic kidney disease: Secondary | ICD-10-CM | POA: Diagnosis not present

## 2023-08-14 DIAGNOSIS — F319 Bipolar disorder, unspecified: Secondary | ICD-10-CM | POA: Diagnosis not present

## 2023-08-14 DIAGNOSIS — E1122 Type 2 diabetes mellitus with diabetic chronic kidney disease: Secondary | ICD-10-CM | POA: Diagnosis not present

## 2023-08-14 DIAGNOSIS — N179 Acute kidney failure, unspecified: Secondary | ICD-10-CM | POA: Diagnosis not present

## 2023-08-14 DIAGNOSIS — S22088D Other fracture of T11-T12 vertebra, subsequent encounter for fracture with routine healing: Secondary | ICD-10-CM | POA: Diagnosis not present

## 2023-08-14 DIAGNOSIS — I2510 Atherosclerotic heart disease of native coronary artery without angina pectoris: Secondary | ICD-10-CM | POA: Diagnosis not present

## 2023-08-19 DIAGNOSIS — E1122 Type 2 diabetes mellitus with diabetic chronic kidney disease: Secondary | ICD-10-CM | POA: Diagnosis not present

## 2023-08-19 DIAGNOSIS — I214 Non-ST elevation (NSTEMI) myocardial infarction: Secondary | ICD-10-CM | POA: Diagnosis not present

## 2023-08-19 DIAGNOSIS — I5032 Chronic diastolic (congestive) heart failure: Secondary | ICD-10-CM | POA: Diagnosis not present

## 2023-08-19 DIAGNOSIS — S22088D Other fracture of T11-T12 vertebra, subsequent encounter for fracture with routine healing: Secondary | ICD-10-CM | POA: Diagnosis not present

## 2023-08-19 DIAGNOSIS — N1832 Chronic kidney disease, stage 3b: Secondary | ICD-10-CM | POA: Diagnosis not present

## 2023-08-19 DIAGNOSIS — I2510 Atherosclerotic heart disease of native coronary artery without angina pectoris: Secondary | ICD-10-CM | POA: Diagnosis not present

## 2023-08-19 DIAGNOSIS — I130 Hypertensive heart and chronic kidney disease with heart failure and stage 1 through stage 4 chronic kidney disease, or unspecified chronic kidney disease: Secondary | ICD-10-CM | POA: Diagnosis not present

## 2023-08-19 DIAGNOSIS — N179 Acute kidney failure, unspecified: Secondary | ICD-10-CM | POA: Diagnosis not present

## 2023-08-19 DIAGNOSIS — F319 Bipolar disorder, unspecified: Secondary | ICD-10-CM | POA: Diagnosis not present

## 2023-08-27 DIAGNOSIS — I130 Hypertensive heart and chronic kidney disease with heart failure and stage 1 through stage 4 chronic kidney disease, or unspecified chronic kidney disease: Secondary | ICD-10-CM | POA: Diagnosis not present

## 2023-08-27 DIAGNOSIS — I214 Non-ST elevation (NSTEMI) myocardial infarction: Secondary | ICD-10-CM | POA: Diagnosis not present

## 2023-08-27 DIAGNOSIS — E1122 Type 2 diabetes mellitus with diabetic chronic kidney disease: Secondary | ICD-10-CM | POA: Diagnosis not present

## 2023-08-27 DIAGNOSIS — I5032 Chronic diastolic (congestive) heart failure: Secondary | ICD-10-CM | POA: Diagnosis not present

## 2023-08-27 DIAGNOSIS — N179 Acute kidney failure, unspecified: Secondary | ICD-10-CM | POA: Diagnosis not present

## 2023-08-27 DIAGNOSIS — I2510 Atherosclerotic heart disease of native coronary artery without angina pectoris: Secondary | ICD-10-CM | POA: Diagnosis not present

## 2023-08-27 DIAGNOSIS — S22088D Other fracture of T11-T12 vertebra, subsequent encounter for fracture with routine healing: Secondary | ICD-10-CM | POA: Diagnosis not present

## 2023-08-27 DIAGNOSIS — F319 Bipolar disorder, unspecified: Secondary | ICD-10-CM | POA: Diagnosis not present

## 2023-08-27 DIAGNOSIS — N1832 Chronic kidney disease, stage 3b: Secondary | ICD-10-CM | POA: Diagnosis not present

## 2023-08-28 DIAGNOSIS — S22088D Other fracture of T11-T12 vertebra, subsequent encounter for fracture with routine healing: Secondary | ICD-10-CM | POA: Diagnosis not present

## 2023-08-28 DIAGNOSIS — I130 Hypertensive heart and chronic kidney disease with heart failure and stage 1 through stage 4 chronic kidney disease, or unspecified chronic kidney disease: Secondary | ICD-10-CM | POA: Diagnosis not present

## 2023-08-28 DIAGNOSIS — F319 Bipolar disorder, unspecified: Secondary | ICD-10-CM | POA: Diagnosis not present

## 2023-08-28 DIAGNOSIS — N1832 Chronic kidney disease, stage 3b: Secondary | ICD-10-CM | POA: Diagnosis not present

## 2023-08-28 DIAGNOSIS — I5032 Chronic diastolic (congestive) heart failure: Secondary | ICD-10-CM | POA: Diagnosis not present

## 2023-08-28 DIAGNOSIS — I2510 Atherosclerotic heart disease of native coronary artery without angina pectoris: Secondary | ICD-10-CM | POA: Diagnosis not present

## 2023-08-28 DIAGNOSIS — E1122 Type 2 diabetes mellitus with diabetic chronic kidney disease: Secondary | ICD-10-CM | POA: Diagnosis not present

## 2023-08-28 DIAGNOSIS — N179 Acute kidney failure, unspecified: Secondary | ICD-10-CM | POA: Diagnosis not present

## 2023-08-28 DIAGNOSIS — I214 Non-ST elevation (NSTEMI) myocardial infarction: Secondary | ICD-10-CM | POA: Diagnosis not present

## 2023-08-29 ENCOUNTER — Telehealth: Payer: Self-pay

## 2023-08-29 NOTE — Telephone Encounter (Signed)
-----   Message ----- From: Ferman Houston Sent: 06/17/2023  10:48 AM EST To: Shari Daughters, MD Subject: Referral                                        Good Morning,  I wanted to update you on this referral.  Unfortunately, we believe patient needs a higher level of care. It has been recommended to see a Geriatric Psych. Please refer out.    I have a personal list (word document that can be emailed) if it would be any help, I will be happy to provide if needed.    You can try: [facility]- [phone] [facility] [phone] [facility] [phone] [facility] [phone]   This is the response we received from the referral you sent.

## 2023-08-29 NOTE — Telephone Encounter (Signed)
 Pt's daughter called regarding pt asking about referral to Neurology, she said it was discussed at pt's last appt? Please advise  Also, pt's daughter said pt's hallucinations and mood swings have gotten severe. About 2-3 weeks ago, pt refused going to the ER by holding scissors in her hand and stating "she was going to stab someone" so pt's family called EMS. EMS then called the police, due to family not having POA over pt yet (pt refuses to go to hospital) EMS and police couldn't take pt to hospital. I advised the family to go to ER or call police if mood swings/hallucinations gets worse. They do have social services involved at this point. They just wanted to see if there is anything you recommend to do? Because they are feeling very overwhelmed, please adv

## 2023-09-04 ENCOUNTER — Emergency Department
Admission: EM | Admit: 2023-09-04 | Discharge: 2023-09-05 | Disposition: A | Discharge status: Home / Self Care | Attending: Emergency Medicine | Admitting: Emergency Medicine

## 2023-09-04 ENCOUNTER — Other Ambulatory Visit: Payer: Self-pay | Admitting: Internal Medicine

## 2023-09-04 ENCOUNTER — Emergency Department

## 2023-09-04 ENCOUNTER — Other Ambulatory Visit: Payer: Self-pay

## 2023-09-04 DIAGNOSIS — N10 Acute tubulo-interstitial nephritis: Secondary | ICD-10-CM | POA: Diagnosis not present

## 2023-09-04 DIAGNOSIS — I6782 Cerebral ischemia: Secondary | ICD-10-CM | POA: Diagnosis not present

## 2023-09-04 DIAGNOSIS — R4182 Altered mental status, unspecified: Secondary | ICD-10-CM | POA: Diagnosis not present

## 2023-09-04 DIAGNOSIS — R443 Hallucinations, unspecified: Secondary | ICD-10-CM | POA: Insufficient documentation

## 2023-09-04 DIAGNOSIS — N390 Urinary tract infection, site not specified: Secondary | ICD-10-CM

## 2023-09-04 DIAGNOSIS — R32 Unspecified urinary incontinence: Secondary | ICD-10-CM | POA: Insufficient documentation

## 2023-09-04 DIAGNOSIS — Z8673 Personal history of transient ischemic attack (TIA), and cerebral infarction without residual deficits: Secondary | ICD-10-CM | POA: Diagnosis not present

## 2023-09-04 DIAGNOSIS — F0390 Unspecified dementia without behavioral disturbance: Secondary | ICD-10-CM | POA: Insufficient documentation

## 2023-09-04 DIAGNOSIS — R451 Restlessness and agitation: Secondary | ICD-10-CM | POA: Insufficient documentation

## 2023-09-04 DIAGNOSIS — R462 Strange and inexplicable behavior: Secondary | ICD-10-CM | POA: Insufficient documentation

## 2023-09-04 DIAGNOSIS — R4689 Other symptoms and signs involving appearance and behavior: Secondary | ICD-10-CM

## 2023-09-04 DIAGNOSIS — E119 Type 2 diabetes mellitus without complications: Secondary | ICD-10-CM

## 2023-09-04 DIAGNOSIS — F29 Unspecified psychosis not due to a substance or known physiological condition: Secondary | ICD-10-CM | POA: Diagnosis not present

## 2023-09-04 DIAGNOSIS — I10 Essential (primary) hypertension: Secondary | ICD-10-CM | POA: Diagnosis not present

## 2023-09-04 DIAGNOSIS — G9389 Other specified disorders of brain: Secondary | ICD-10-CM | POA: Diagnosis not present

## 2023-09-04 DIAGNOSIS — I6523 Occlusion and stenosis of bilateral carotid arteries: Secondary | ICD-10-CM | POA: Diagnosis not present

## 2023-09-04 LAB — CBC WITH DIFFERENTIAL/PLATELET
Abs Immature Granulocytes: 0.04 10*3/uL (ref 0.00–0.07)
Basophils Absolute: 0.1 10*3/uL (ref 0.0–0.1)
Basophils Relative: 1 %
Eosinophils Absolute: 0.1 10*3/uL (ref 0.0–0.5)
Eosinophils Relative: 2 %
HCT: 35.7 % — ABNORMAL LOW (ref 36.0–46.0)
Hemoglobin: 11.4 g/dL — ABNORMAL LOW (ref 12.0–15.0)
Immature Granulocytes: 1 %
Lymphocytes Relative: 24 %
Lymphs Abs: 2 10*3/uL (ref 0.7–4.0)
MCH: 28.8 pg (ref 26.0–34.0)
MCHC: 31.9 g/dL (ref 30.0–36.0)
MCV: 90.2 fL (ref 80.0–100.0)
Monocytes Absolute: 0.9 10*3/uL (ref 0.1–1.0)
Monocytes Relative: 11 %
Neutro Abs: 5.3 10*3/uL (ref 1.7–7.7)
Neutrophils Relative %: 61 %
Platelets: 181 10*3/uL (ref 150–400)
RBC: 3.96 MIL/uL (ref 3.87–5.11)
RDW: 12.5 % (ref 11.5–15.5)
WBC: 8.4 10*3/uL (ref 4.0–10.5)
nRBC: 0 % (ref 0.0–0.2)

## 2023-09-04 LAB — BASIC METABOLIC PANEL WITH GFR
Anion gap: 9 (ref 5–15)
BUN: 35 mg/dL — ABNORMAL HIGH (ref 8–23)
CO2: 28 mmol/L (ref 22–32)
Calcium: 10.4 mg/dL — ABNORMAL HIGH (ref 8.9–10.3)
Chloride: 107 mmol/L (ref 98–111)
Creatinine, Ser: 1.57 mg/dL — ABNORMAL HIGH (ref 0.44–1.00)
GFR, Estimated: 33 mL/min — ABNORMAL LOW (ref >60)
Glucose, Bld: 92 mg/dL (ref 70–99)
Potassium: 3.3 mmol/L — ABNORMAL LOW (ref 3.5–5.1)
Sodium: 144 mmol/L (ref 135–145)

## 2023-09-04 NOTE — ED Triage Notes (Signed)
 BIB EMS from home.  Pt lives with son. Son reports pt has been increasingly altered and has not allowed him to change her in about 4 days.  EMS reported they had responded a few times to the address but pt refused transport.  Pt was IVCd by son and brought in by EMS at this time.  Pt has no complaints.  Pt is cooperative with nursing staff at time of intake.  Pt given bed bath and placed in clean gown and linens.  Warm blankets applied.

## 2023-09-04 NOTE — ED Provider Notes (Signed)
 [facility] Provider Note    Event Date/Time   First MD Initiated Contact with Patient 09/04/23 1850     (approximate)   History   Altered Mental Status and IVC   HPI  [name] is a 83 y.o. female who presents to the emergency department today under IVC because of concerns for agitation and abnormal behavior.  History is primarily obtained from son at bedside.  He states that they have been concerned that the patient might be having dementia.  However over the past couple of days she has become increasingly agitated.  Got to the point tonight that he felt like she could no longer safely care for her.  IVC was obtained.  Patient herself denies any complaints except for occasional burning with urination over the past week.     Physical Exam   Triage Vital Signs: ED Triage Vitals [09/04/23 1840]  Encounter Vitals Group     BP (!) 134/48     Systolic BP Percentile      Diastolic BP Percentile      Pulse Rate 64     Resp 17     Temp 98.8 F (37.1 C)     Temp Source Oral     SpO2 95 %     Weight      Height      Head Circumference      Peak Flow      Pain Score      Pain Loc      Pain Education      Exclude from Growth Chart     Most recent vital signs: Vitals:   09/04/23 1840  BP: (!) 134/48  Pulse: 64  Resp: 17  Temp: 98.8 F (37.1 C)  SpO2: 95%   General: Awake, alert, not completely oriented. CV:  Good peripheral perfusion. Regular rate and rhythm. Resp:  Normal effort. Lungs clear. Abd:  No distention. Non tender   ED Results / Procedures / Treatments   Labs (all labs ordered are listed, but only abnormal results are displayed) Labs Reviewed  CBC WITH DIFFERENTIAL/PLATELET - Abnormal; Notable for the following components:      Result Value   Hemoglobin 11.4 (*)    HCT 35.7 (*)    All other components within normal limits  BASIC METABOLIC PANEL WITH GFR - Abnormal; Notable for the following components:   Potassium  3.3 (*)    BUN 35 (*)    Creatinine, Ser 1.57 (*)    Calcium  10.4 (*)    GFR, Estimated 33 (*)    All other components within normal limits  URINALYSIS, ROUTINE W REFLEX MICROSCOPIC     EKG  None   RADIOLOGY I independently interpreted and visualized the CT head. My interpretation: No [facility] Radiology interpretation:  IMPRESSION:  1. Generalized cerebral atrophy with chronic white matter small  vessel ischemic changes.  2. 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  3. No acute intracranial abnormality.      PROCEDURES:  Critical Care performed: No  MEDICATIONS ORDERED IN ED: Medications - No data to display   IMPRESSION / MDM / ASSESSMENT AND PLAN / ED COURSE  I reviewed the triage vital signs and the nursing notes.                              Differential diagnosis includes, but is not limited to, UTI, dementia, intracranial  process  Patient's presentation is most consistent with acute presentation with potential threat to life or bodily function.   Patient presents to the emergency department today under IVC because of concerns for abnormal behavior.  The patient sounds like likely has dementia per son's history.  Will check blood work and urine to see if there is signs that could be causing worsening dementia.  Additionally will check CT head.  CT head without concerning abnormalities.  Awaiting urine at time of signout.  Will have psychiatry evaluate.     FINAL CLINICAL IMPRESSION(S) / ED DIAGNOSES   Final diagnoses:  Abnormal behavior        Rx / DC Orders     Note:  This document was prepared using Dragon voice recognition software and may include unintentional dictation errors.    Marylynn Soho, MD 09/04/23 [phone]

## 2023-09-04 NOTE — ED Notes (Signed)
 Patient reports she is unable to provide a urine sample at this time.  Adult brief checked and patient currently dry.  Will continue to monitor.

## 2023-09-05 DIAGNOSIS — N10 Acute tubulo-interstitial nephritis: Secondary | ICD-10-CM

## 2023-09-05 DIAGNOSIS — R4182 Altered mental status, unspecified: Secondary | ICD-10-CM

## 2023-09-05 DIAGNOSIS — R462 Strange and inexplicable behavior: Secondary | ICD-10-CM | POA: Diagnosis not present

## 2023-09-05 LAB — URINALYSIS, ROUTINE W REFLEX MICROSCOPIC
Bilirubin Urine: NEGATIVE
Glucose, UA: NEGATIVE mg/dL
Hgb urine dipstick: NEGATIVE
Ketones, ur: NEGATIVE mg/dL
Nitrite: NEGATIVE
Protein, ur: NEGATIVE mg/dL
Specific Gravity, Urine: 1.009 (ref 1.005–1.030)
WBC, UA: >50 WBC/hpf (ref 0–5)
pH: 7 (ref 5.0–8.0)

## 2023-09-05 LAB — TROPONIN I (HIGH SENSITIVITY): Troponin I (High Sensitivity): 33 ng/L — ABNORMAL HIGH (ref <18)

## 2023-09-05 MED ORDER — CEPHALEXIN 250 MG PO CAPS
250.0000 mg | ORAL_CAPSULE | Freq: Three times a day (TID) | ORAL | Status: DC
  Administered 2023-09-05: 250 mg via ORAL
  Filled 2023-09-05: qty 1

## 2023-09-05 MED ORDER — CEPHALEXIN 500 MG PO CAPS
500.0000 mg | ORAL_CAPSULE | Freq: Once | ORAL | Status: AC
  Administered 2023-09-05: 500 mg via ORAL
  Filled 2023-09-05: qty 1

## 2023-09-05 MED ORDER — ACETAMINOPHEN 500 MG PO TABS
1000.0000 mg | ORAL_TABLET | Freq: Once | ORAL | Status: AC
  Administered 2023-09-05: 1000 mg via ORAL
  Filled 2023-09-05: qty 2

## 2023-09-05 MED ORDER — CEPHALEXIN 500 MG PO CAPS: 500.0000 mg | ORAL_CAPSULE | Freq: Two times a day (BID) | ORAL | 0 refills | Status: DC

## 2023-09-05 NOTE — ED Notes (Signed)
 Patient refuses to wear socks at this time.

## 2023-09-05 NOTE — ED Notes (Signed)
Patient resting with eyes closed. Respirations even and unlabored.

## 2023-09-05 NOTE — ED Notes (Signed)
 Pt incontinent of urine. Pt cleaned and placed in fresh brief and gown. New pads on bed. Warm blankets provided. No other needs at this time.

## 2023-09-05 NOTE — ED Notes (Signed)
Patient received dinner tray 

## 2023-09-05 NOTE — ED Notes (Signed)
 Patient refusing to have more blood taken.  Has pulled out IV.  Aggressive towards staff and hitting when attempting to provide care.  Patient incontinent of urine.  Personal hygiene preformed.  New adult brief placed at this time.

## 2023-09-05 NOTE — ED Notes (Signed)
IVC /psych consult pending 

## 2023-09-05 NOTE — BH Assessment (Signed)
 TTS contacted [name] via phone call and secure chat about consult.

## 2023-09-05 NOTE — ED Notes (Signed)
TTS evaluation being completed at this time.

## 2023-09-05 NOTE — ED Notes (Signed)
 Patient refusing to provide urine sample at this time.  Continue to offer bed pan/PureWick/catheter and patient refuses

## 2023-09-05 NOTE — ED Provider Notes (Signed)
-----------------------------------------   6:31 PM on 09/05/2023 ----------------------------------------- Psychiatry seen and evaluated the patient.  They believe the patient is safe for discharge home from a psychiatric standpoint.  From a medical standpoint patient does have a urinary tract infection but no other significant findings.  I discussed this with the patient and son, son is reluctantly agreeable.  He states he will be attempting to obtain legal guardianship with the patient which seems like a reasonable action.  However as psychiatry recommends discharge home from their perspective and medically with only urinary tract infection we will place the patient on antibiotics and discharged home.   Ruth Cove, MD 09/05/23 [phone]

## 2023-09-05 NOTE — BH Assessment (Signed)
 Comprehensive Clinical Assessment (CCA) Screening, Triage and Referral Note  09/05/2023 [name] [id number] Recommendations for Services/Supports/Treatments: Disposition pending [name] is an 83 year old, English speaking, white female with a hx of dementia. Pt is IVC'd. Per triage note: BIB EMS from home. Pt lives with son. Son reports pt has been increasingly altered and has not allowed him to change her in about 4 days. EMS reported they had responded a few times to the address but pt refused transport. Pt was IVCd by son and brought in by EMS at this time. On assessment, pt. was fixated on not being signed into the hospital when asked why she'd presented to the ED. Pt admitted to feeling kidnapped and that she was admitted against her will. Pt admitted to giving EMS a hard time due to feeling that nothing is wrong with her. Pt was fixated on having surgery in the morning.  Pt continued to perseverate about her grievances with the hospital and threatening to sue. Pt was confused and had no insight. Thought processes were disorganized and pt.'s speech was irrelevant. Pt had an anxious mood and a responsive affect. Pt unable to confirm or deny having SI, HI, AV/H due to pt.'s mental status.  Chief Complaint:  Chief Complaint  Patient presents with   Altered Mental Status   IVC   Visit Diagnosis: Moderate dementia  Patient Reported Information How did you hear about us ? No data recorded What Is the Reason for Your Visit/Call Today? No data recorded How Long Has This Been Causing You Problems? No data recorded What Do You Feel Would Help You the Most Today? No data recorded  Have You Recently Had Any Thoughts About Hurting Yourself? No data recorded Are You Planning to Commit Suicide/Harm Yourself At This time? No data recorded  Have you Recently Had Thoughts About Hurting Someone [name]? No data recorded Are You Planning to Harm Someone at This Time? No data  recorded Explanation: No data recorded  Have You Used Any Alcohol or Drugs in the Past 24 Hours? No data recorded How Long Ago Did You Use Drugs or Alcohol? No data recorded What Did You Use and How Much? No data recorded  Do You Currently Have a Therapist/Psychiatrist? No data recorded Name of Therapist/Psychiatrist: No data recorded  Have You Been Recently Discharged From Any Office Practice or Programs? No data recorded Explanation of Discharge From Practice/Program: No data recorded   CCA Screening Triage Referral Assessment Type of Contact: No data recorded Telemedicine Service Delivery:   Is this Initial or Reassessment?   Date Telepsych consult ordered in [facility]:    Time Telepsych consult ordered in [facility]:    Location of Assessment: No data recorded Provider Location: No data recorded   Collateral Involvement: No data recorded  Does Patient Have a Court Appointed Legal Guardian? No data recorded Name and Contact of Legal Guardian: No data recorded If Minor and Not Living with Parent(s), Who has Custody? No data recorded Is CPS involved or ever been involved? No data recorded Is APS involved or ever been involved? No data recorded  Patient Determined To Be At Risk for Harm To Self or Others Based on Review of Patient Reported Information or Presenting Complaint? No data recorded Method: No data recorded Availability of Means: No data recorded Intent: No data recorded Notification Required: No data recorded Additional Information for Danger to Others Potential: No data recorded Additional Comments for Danger to Others Potential: No data recorded Are There Guns or Other  Weapons in Your Home? No data recorded Types of Guns/Weapons: No data recorded Are These Weapons Safely Secured?                            No data recorded Who Could Verify You Are Able To Have These Secured: No data recorded Do You Have any Outstanding Charges, Pending Court Dates, Parole/Probation? No data  recorded Contacted To Inform of Risk of Harm To Self or Others: No data recorded  Does Patient Present under Involuntary Commitment? No data recorded   [place] of Residence: No data recorded  Patient Currently Receiving the Following Services: No data recorded  Determination of Need: No data recorded  Options For Referral: No data recorded  Disposition Recommendation per psychiatric provider: Pending psych consult  Alanys Godino R Aliana Kreischer, LCAS

## 2023-09-05 NOTE — ED Notes (Signed)
 ivc rescinded by MD Paduchowski.

## 2023-09-05 NOTE — ED Provider Notes (Signed)
-----------------------------------------   2:22 AM on 09/05/2023 -----------------------------------------   EKG unremarkable.  Troponin minimally elevated which is much less than 2 months ago.  Patient uncooperative with repeat drawl for second troponin.  Never had chest pain.  Will hold off for now rather than minimally restrained patient.  Awaiting urinalysis.  ----------------------------------------- 5:40 AM on 09/05/2023 -----------------------------------------   Keflex ordered for UTI.  No events overnight.  Patient remains in the emergency department awaiting psychiatric consultation.   Lisl Slingerland J, MD 09/05/23 [phone]

## 2023-09-05 NOTE — Consult Note (Signed)
 [name] Telepsychiatry Consult Note  Patient Name: [name] MRN: [id number] DOB: 11-09-1940 DATE OF Consult: 09/05/2023   TELEPSYCHIATRY ATTESTATION & CONSENT  As the provider for this telehealth consult, I attest that I verified the patient's identity using two separate identifiers, introduced myself to the patient, provided my credentials, disclosed my location, and performed this encounter via a HIPAA-compliant, real-time, face-to-face, two-way, interactive audio and video platform and with the full consent and agreement of the patient (or guardian as applicable.)  Patient physical location: [facility] .abed 25 in ED Telehealth provider physical location: home office in state of [place]  Video scheduled start time: 1600 (Central Time) Video end time: 1645 (Central Time)   PRIMARY PSYCHIATRIC DIAGNOSES (ICD-10 format preferred)  Involuntary commitment 09-04-23/she  no longer meets committment hold criteria No si no hi no psychosis no mania no behavioral disturbance now  Altered mental status slowly resolving  UTI  RECOMMENDATIONS      Medication recommendations: need to take her antibiotic for UTI  Non-Medication/therapeutic recommendations: no sitter needed now  There are no psychiatric contraindications to discharge at this time   Plan Post Discharge/Psychiatric Care Follow-up resources follow up with PCP   Follow-Up Telepsychiatry C/L services: We will sign off for now. Please re-consult our service if needed for any concerning changes in the patient's condition, discharge planning, or questions.  Communication:  Treatment team members (and family members if applicable) who  were involved in treatment/care discussions and planning, and with whom we spoke  or engaged with via secure text/chat, include the following: ED physician  Thank you for involving us  in the care of this patient. If you have any additional  questions or concerns, please call [phone] and ask for me or  the provider on-call   CHIEF COMPLAINT/REASON FOR CONSULT  Alerted mental status and agitation  HISTORY OF PRESENT ILLNESS (HPI)  83 yo female reported last week--reported hallucinations and mood swings have gotten severe. About 2-3 weeks ago, pt refused going to the ER by holding scissors in her hand and stating "she was going to stab someone" so pt's family called EMS.   BIB EMS to ED for altered mental status; agitation , abnormal behaviors; no change clothes for 4 days,  family feels patient is not safe at home  Per triage referral note  hallucinations and mood swings have gotten severe. About 2-3 weeks ago, pt refused going to the ER by holding scissors in her hand and stating "she was going to stab someone" so pt's family called EMS.   In ed ---UNCOOPERATIVE -pulling IV out, refuses blood draw, aggressive and hitting staff. Incontinent of urine;  Review of chart and Interview with patient shows Hx of depression/anxiety/ bipolar dx  noted in chart Hx of zoloft  treatment in the past She appears less altered in nature now UTI probably causative in her altered mental status Better now No longer combative and hitting nursing staff She does not recall being aggressive with nursing staff Still feels like she was kidnapped by others Now more cooperative less agitated Up in bed alert verbal hard of hearing  But oriented to month , year and her DOB Knows who the president of [place]  is No gross psychosis No severe paranoia No crying spells or severe dysphoria No panic attacks No SI No HI Took one dose of antibiotic for her UTI At this point in time no behavioral disturbance that would obligate inpatient psych at this time   Suspect altered mental status due to  UTI No sitter needed No emergent meds required at this time      PAST PSYCHIATRIC HISTORY  No past SA Denies past psych hospitals   Otherwise as per HPI above.  PAST MEDICAL HISTORY  Past Medical History:  Diagnosis  Date   Arthritis    "hands, feet, knees, shoulders" (05/05/2014)   Bipolar disorder (HCC)    Cataracts, bilateral    immature   Chronic kidney disease    "dr says they aren't functioning like they should" (05/05/2014)   Coronary artery disease    Depression    takes Zoloft  daily   History of bronchitis 60yrs ago   Hyperlipidemia    takes Atorvastatin  daily   Hypertension    takes Metoprolol ,Lisinopril ,and Apresoline  daily   Joint pain    Peripheral edema    takes Chlothalidone daily   Squamous cell cancer of skin of nose 2012   "right"   Squamous cell cancer of skin of nose    Type II diabetes mellitus (HCC)    takes Tradjenta  daily      HOME MEDICATIONS  (Not in a hospital admission)       ALLERGIES  Allergies  Allergen Reactions   Codeine Nausea And Vomiting    "Makes me deathly sick"    SOCIAL & SUBSTANCE USE HISTORY  Social History   Socioeconomic History   Marital status: Widowed    Spouse name: Not on file   Number of children: Not on file   Years of education: Not on file   Highest education level: Not on file  Occupational History   Not on file  Tobacco Use   Smoking status: Never   Smokeless tobacco: Never  Vaping Use   Vaping status: Never Used  Substance and Sexual Activity   Alcohol use: No    Alcohol/week: 0.0 standard drinks of alcohol   Drug use: No   Sexual activity: Not on file  Other Topics Concern   Not on file  Social History Narrative   Not on file   Social Drivers of Health   Financial Resource Strain: Not on file  Food Insecurity: No Food Insecurity (07/11/2023)   Hunger Vital Sign    Worried About Running Out of Food in the Last Year: Never true    Ran Out of Food in the Last Year: Never true  Transportation Needs: No Transportation Needs (07/11/2023)   PRAPARE - Administrator, Civil Service (Medical): No    Lack of Transportation (Non-Medical): No  Physical Activity: Not on file  Stress: Not on file  Social  Connections: Socially Isolated (07/04/2023)   Social Connection and Isolation Panel [NHANES]    Frequency of Communication with Friends and Family: More than three times a week    Frequency of Social Gatherings with Friends and Family: More than three times a week    Attends Religious Services: Never    Database administrator or Organizations: No    Attends Banker Meetings: Never    Marital Status: Widowed  No drugs of abuse   FAMILY HISTORY  Family History  Problem Relation Age of Onset   Heart disease Sister    Family Psychiatric History (if known):          MENTAL STATUS EXAM (MSE)  Mental Status Exam: General Appearance: Casual and Neat  Orientation:  Full (Time, Place, and Person)  Memory:  mild impairment but no gross severe  Concentration:  Concentration: Fair  Recall:  Fair  Attention  Fair  Eye Contact:  Fair  Speech:  Clear and Coherent and Normal Rate  Language:  Fair  Volume:  Normal  Mood: anxious  Affect:  Constricted  Thought Process:  Coherent and Goal Directed  Thought Content:  Negative  Suicidal Thoughts:  No  Homicidal Thoughts:  No  Judgement:  Fair  Insight:  Lacking  Psychomotor Activity:  Negative  Akathisia:  Negative  Fund of Knowledge:  Fair    Assets:  Resilience Social Support  Cognition:  Impaired,  Mild  ADL's:  Intact  AIMS (if indicated):          VITALS (IF TAKEN)   @VSR @     LABS that are pertinent     ROS & ADDITIONAL FINDINGS  ROS: Notable for the following relevant positive findings: Psychiatric: anxiety agitation altered mental status Other notable positive ROS findings: no gross psychosis no mania  Additional findings:      Musculoskeletal:    [x]  No Abnormal Movements Observed        []  Impaired      Gait & Station:        []  Normal        []  Wheelchair/Walker          [x]  Laying/Sitting       Pain Screening:   [x]  Denies    []  Present--mild to moderate     []  Present--severe (will                              consider referral for ongoing evaluation and treatment)      Nutrition & Dental Concerns:no gross dental or  eating disorder   RISK ASSESSMENT*  Is the patient experiencing any suicidal or homicidal ideations:     [] YES        [x]  NO   Protective factors considered for safety management: family is protective  Risk factors/concerns considered for safety management: (check all that apply) []  Prior attempt                                      []  Hopelessness       []  Family history of suicide                    []  Impulsivity [x]  Depression                                         []  Aggression []  Substance abuse/dependence          []  Isolation [x]  Physical illness/chronic pain              []  Barriers to accessing treatment []  Recent loss                                        []  Unwillingness to seek help []  Access to lethal means                      []  Female gender [x]  Age over 5                                        [  x] Unmarried  Is there a Astronomer plan with the patient and treatment team to minimize risk factors and promote protective factors:     [x]  YES      []  NO            Explain: routine nursing staff       Based on my current evaluation and risk assessment of the patient at the time of this encounter, this patient is considered to be at:   [x]    Low Risk                      []   Moderate Risk                     []   High Risk  *RISK ASSESSMENT Risk assessment is a dynamic process; it is possible that this patient's condition, and risk level, may change. This should be re-evaluated and managed over time as appropriate. Please re-consult psychiatric consult services if additional assistance is needed in terms of risk assessment and management. If your team decides to discharge this patient, please advise the patient how to best access emergency psychiatric services, or to call 911, if their condition worsens or they feel unsafe in any way.   CW Dolores Freud, M. D., [facility]

## 2023-09-05 NOTE — ED Notes (Signed)
 Patient provided with a warm blanket per request. Thankful for care at this moment

## 2023-09-05 NOTE — Discharge Instructions (Addendum)
 Please take antibiotics as prescribed for their entire course.  Please follow-up with your primary care doctor on Monday for recheck/reevaluation.  Return to the emergency department for any symptom concerning to yourself or family members.

## 2023-09-09 ENCOUNTER — Ambulatory Visit: Admitting: Internal Medicine

## 2023-09-09 DIAGNOSIS — F03B18 Unspecified dementia, moderate, with other behavioral disturbance: Secondary | ICD-10-CM

## 2023-09-09 NOTE — Progress Notes (Signed)
 Patient unable to take care of herself, hallucinating and threatens physical harm to others. She was committed last week but only kept for one night. Numerous paranoid delusions and hallucinating.

## 2023-09-10 ENCOUNTER — Other Ambulatory Visit: Payer: Self-pay | Admitting: Internal Medicine

## 2023-09-11 ENCOUNTER — Other Ambulatory Visit: Payer: Self-pay | Admitting: Internal Medicine

## 2023-09-11 DIAGNOSIS — S22088D Other fracture of T11-T12 vertebra, subsequent encounter for fracture with routine healing: Secondary | ICD-10-CM | POA: Diagnosis not present

## 2023-09-11 DIAGNOSIS — F319 Bipolar disorder, unspecified: Secondary | ICD-10-CM | POA: Diagnosis not present

## 2023-09-11 DIAGNOSIS — I214 Non-ST elevation (NSTEMI) myocardial infarction: Secondary | ICD-10-CM | POA: Diagnosis not present

## 2023-09-11 DIAGNOSIS — E782 Mixed hyperlipidemia: Secondary | ICD-10-CM

## 2023-09-11 DIAGNOSIS — I2510 Atherosclerotic heart disease of native coronary artery without angina pectoris: Secondary | ICD-10-CM | POA: Diagnosis not present

## 2023-09-11 DIAGNOSIS — E1122 Type 2 diabetes mellitus with diabetic chronic kidney disease: Secondary | ICD-10-CM | POA: Diagnosis not present

## 2023-09-11 DIAGNOSIS — N1832 Chronic kidney disease, stage 3b: Secondary | ICD-10-CM | POA: Diagnosis not present

## 2023-09-11 DIAGNOSIS — I130 Hypertensive heart and chronic kidney disease with heart failure and stage 1 through stage 4 chronic kidney disease, or unspecified chronic kidney disease: Secondary | ICD-10-CM | POA: Diagnosis not present

## 2023-09-11 DIAGNOSIS — N179 Acute kidney failure, unspecified: Secondary | ICD-10-CM | POA: Diagnosis not present

## 2023-09-11 DIAGNOSIS — I5032 Chronic diastolic (congestive) heart failure: Secondary | ICD-10-CM | POA: Diagnosis not present

## 2023-09-12 ENCOUNTER — Other Ambulatory Visit: Payer: Self-pay | Admitting: Internal Medicine

## 2023-09-12 DIAGNOSIS — I214 Non-ST elevation (NSTEMI) myocardial infarction: Secondary | ICD-10-CM | POA: Diagnosis not present

## 2023-09-12 DIAGNOSIS — I5032 Chronic diastolic (congestive) heart failure: Secondary | ICD-10-CM | POA: Diagnosis not present

## 2023-09-12 DIAGNOSIS — F319 Bipolar disorder, unspecified: Secondary | ICD-10-CM | POA: Diagnosis not present

## 2023-09-12 DIAGNOSIS — I130 Hypertensive heart and chronic kidney disease with heart failure and stage 1 through stage 4 chronic kidney disease, or unspecified chronic kidney disease: Secondary | ICD-10-CM | POA: Diagnosis not present

## 2023-09-12 DIAGNOSIS — N179 Acute kidney failure, unspecified: Secondary | ICD-10-CM | POA: Diagnosis not present

## 2023-09-12 DIAGNOSIS — E1122 Type 2 diabetes mellitus with diabetic chronic kidney disease: Secondary | ICD-10-CM | POA: Diagnosis not present

## 2023-09-12 DIAGNOSIS — I2510 Atherosclerotic heart disease of native coronary artery without angina pectoris: Secondary | ICD-10-CM | POA: Diagnosis not present

## 2023-09-12 DIAGNOSIS — S22088D Other fracture of T11-T12 vertebra, subsequent encounter for fracture with routine healing: Secondary | ICD-10-CM | POA: Diagnosis not present

## 2023-09-12 DIAGNOSIS — I10 Essential (primary) hypertension: Secondary | ICD-10-CM

## 2023-09-12 DIAGNOSIS — N1832 Chronic kidney disease, stage 3b: Secondary | ICD-10-CM | POA: Diagnosis not present

## 2023-09-15 ENCOUNTER — Ambulatory Visit: Admitting: Internal Medicine

## 2023-09-22 ENCOUNTER — Other Ambulatory Visit: Payer: Self-pay | Admitting: Internal Medicine

## 2023-09-22 DIAGNOSIS — F330 Major depressive disorder, recurrent, mild: Secondary | ICD-10-CM

## 2023-09-27 ENCOUNTER — Emergency Department
Admission: EM | Admit: 2023-09-27 | Discharge: 2023-10-05 | Disposition: A | Discharge status: Home / Self Care | Attending: Emergency Medicine | Admitting: Emergency Medicine

## 2023-09-27 ENCOUNTER — Emergency Department

## 2023-09-27 ENCOUNTER — Other Ambulatory Visit: Payer: Self-pay

## 2023-09-27 DIAGNOSIS — F600 Paranoid personality disorder: Secondary | ICD-10-CM | POA: Diagnosis not present

## 2023-09-27 DIAGNOSIS — R0989 Other specified symptoms and signs involving the circulatory and respiratory systems: Secondary | ICD-10-CM | POA: Diagnosis not present

## 2023-09-27 DIAGNOSIS — F22 Delusional disorders: Secondary | ICD-10-CM

## 2023-09-27 DIAGNOSIS — F0393 Unspecified dementia, unspecified severity, with mood disturbance: Secondary | ICD-10-CM | POA: Diagnosis not present

## 2023-09-27 DIAGNOSIS — N189 Chronic kidney disease, unspecified: Secondary | ICD-10-CM | POA: Insufficient documentation

## 2023-09-27 DIAGNOSIS — D649 Anemia, unspecified: Secondary | ICD-10-CM | POA: Insufficient documentation

## 2023-09-27 DIAGNOSIS — R4182 Altered mental status, unspecified: Secondary | ICD-10-CM | POA: Insufficient documentation

## 2023-09-27 DIAGNOSIS — R918 Other nonspecific abnormal finding of lung field: Secondary | ICD-10-CM | POA: Diagnosis not present

## 2023-09-27 DIAGNOSIS — I2510 Atherosclerotic heart disease of native coronary artery without angina pectoris: Secondary | ICD-10-CM | POA: Insufficient documentation

## 2023-09-27 DIAGNOSIS — F329 Major depressive disorder, single episode, unspecified: Secondary | ICD-10-CM | POA: Insufficient documentation

## 2023-09-27 DIAGNOSIS — F0390 Unspecified dementia without behavioral disturbance: Secondary | ICD-10-CM | POA: Insufficient documentation

## 2023-09-27 DIAGNOSIS — E1122 Type 2 diabetes mellitus with diabetic chronic kidney disease: Secondary | ICD-10-CM | POA: Insufficient documentation

## 2023-09-27 DIAGNOSIS — I700 Atherosclerosis of aorta: Secondary | ICD-10-CM | POA: Diagnosis not present

## 2023-09-27 DIAGNOSIS — N390 Urinary tract infection, site not specified: Secondary | ICD-10-CM | POA: Diagnosis not present

## 2023-09-27 DIAGNOSIS — R4689 Other symptoms and signs involving appearance and behavior: Secondary | ICD-10-CM | POA: Diagnosis present

## 2023-09-27 DIAGNOSIS — R410 Disorientation, unspecified: Secondary | ICD-10-CM | POA: Diagnosis not present

## 2023-09-27 DIAGNOSIS — I130 Hypertensive heart and chronic kidney disease with heart failure and stage 1 through stage 4 chronic kidney disease, or unspecified chronic kidney disease: Secondary | ICD-10-CM | POA: Insufficient documentation

## 2023-09-27 DIAGNOSIS — I129 Hypertensive chronic kidney disease with stage 1 through stage 4 chronic kidney disease, or unspecified chronic kidney disease: Secondary | ICD-10-CM | POA: Insufficient documentation

## 2023-09-27 HISTORY — DX: Dementia in other diseases classified elsewhere, unspecified severity, without behavioral disturbance, psychotic disturbance, mood disturbance, and anxiety: F02.80

## 2023-09-27 LAB — CBC WITH DIFFERENTIAL/PLATELET
Abs Immature Granulocytes: 0.03 10*3/uL (ref 0.00–0.07)
Basophils Absolute: 0 10*3/uL (ref 0.0–0.1)
Basophils Relative: 0 %
Eosinophils Absolute: 0 10*3/uL (ref 0.0–0.5)
Eosinophils Relative: 0 %
HCT: 36.4 % (ref 36.0–46.0)
Hemoglobin: 11.9 g/dL — ABNORMAL LOW (ref 12.0–15.0)
Immature Granulocytes: 0 %
Lymphocytes Relative: 16 %
Lymphs Abs: 1.4 10*3/uL (ref 0.7–4.0)
MCH: 29.2 pg (ref 26.0–34.0)
MCHC: 32.7 g/dL (ref 30.0–36.0)
MCV: 89.2 fL (ref 80.0–100.0)
Monocytes Absolute: 0.5 10*3/uL (ref 0.1–1.0)
Monocytes Relative: 6 %
Neutro Abs: 6.7 10*3/uL (ref 1.7–7.7)
Neutrophils Relative %: 78 %
Platelets: 146 10*3/uL — ABNORMAL LOW (ref 150–400)
RBC: 4.08 MIL/uL (ref 3.87–5.11)
RDW: 14.5 % (ref 11.5–15.5)
WBC: 8.6 10*3/uL (ref 4.0–10.5)
nRBC: 0 % (ref 0.0–0.2)

## 2023-09-27 LAB — COMPREHENSIVE METABOLIC PANEL WITH GFR
ALT: 19 U/L (ref 0–44)
AST: 31 U/L (ref 15–41)
Albumin: 4.4 g/dL (ref 3.5–5.0)
Alkaline Phosphatase: 59 U/L (ref 38–126)
Anion gap: 13 (ref 5–15)
BUN: 40 mg/dL — ABNORMAL HIGH (ref 8–23)
CO2: 22 mmol/L (ref 22–32)
Calcium: 11.1 mg/dL — ABNORMAL HIGH (ref 8.9–10.3)
Chloride: 106 mmol/L (ref 98–111)
Creatinine, Ser: 1.62 mg/dL — ABNORMAL HIGH (ref 0.44–1.00)
GFR, Estimated: 31 mL/min — ABNORMAL LOW (ref >60)
Glucose, Bld: 116 mg/dL — ABNORMAL HIGH (ref 70–99)
Potassium: 3.5 mmol/L (ref 3.5–5.1)
Sodium: 141 mmol/L (ref 135–145)
Total Bilirubin: 1 mg/dL (ref 0.0–1.2)
Total Protein: 7.6 g/dL (ref 6.5–8.1)

## 2023-09-27 LAB — URINALYSIS, W/ REFLEX TO CULTURE (INFECTION SUSPECTED)
Bacteria, UA: NONE SEEN
Bilirubin Urine: NEGATIVE
Glucose, UA: NEGATIVE mg/dL
Hgb urine dipstick: NEGATIVE
Ketones, ur: NEGATIVE mg/dL
Leukocytes,Ua: NEGATIVE
Nitrite: NEGATIVE
Protein, ur: NEGATIVE mg/dL
Specific Gravity, Urine: 1.012 (ref 1.005–1.030)
pH: 5 (ref 5.0–8.0)

## 2023-09-27 MED ORDER — ASPIRIN 81 MG PO TBEC
81.0000 mg | DELAYED_RELEASE_TABLET | Freq: Every day | ORAL | Status: DC
  Administered 2023-09-27 – 2023-10-05 (×9): 81 mg via ORAL
  Filled 2023-09-27 (×9): qty 1

## 2023-09-27 MED ORDER — VITAMIN D 25 MCG (1000 UNIT) PO TABS
1000.0000 [IU] | ORAL_TABLET | Freq: Every day | ORAL | Status: DC
  Administered 2023-09-27 – 2023-10-05 (×9): 1000 [IU] via ORAL
  Filled 2023-09-27 (×9): qty 1

## 2023-09-27 MED ORDER — ROSUVASTATIN CALCIUM 20 MG PO TABS
40.0000 mg | ORAL_TABLET | Freq: Every day | ORAL | Status: DC
  Administered 2023-09-27 – 2023-10-04 (×7): 40 mg via ORAL
  Filled 2023-09-27 (×12): qty 2

## 2023-09-27 MED ORDER — PIOGLITAZONE HCL 15 MG PO TABS
15.0000 mg | ORAL_TABLET | Freq: Every morning | ORAL | Status: DC
  Administered 2023-09-28 – 2023-10-05 (×8): 15 mg via ORAL
  Filled 2023-09-27 (×9): qty 1

## 2023-09-27 MED ORDER — ENSURE ENLIVE PO LIQD
237.0000 mL | Freq: Two times a day (BID) | ORAL | Status: DC
  Administered 2023-09-28 – 2023-10-05 (×11): 237 mL via ORAL
  Filled 2023-09-27: qty 237

## 2023-09-27 MED ORDER — SERTRALINE HCL 50 MG PO TABS
50.0000 mg | ORAL_TABLET | Freq: Every day | ORAL | Status: DC
  Administered 2023-09-27 – 2023-10-02 (×6): 50 mg via ORAL
  Filled 2023-09-27 (×6): qty 1

## 2023-09-27 MED ORDER — AMLODIPINE BESYLATE 5 MG PO TABS
10.0000 mg | ORAL_TABLET | Freq: Every day | ORAL | Status: DC
  Administered 2023-09-27 – 2023-10-05 (×9): 10 mg via ORAL
  Filled 2023-09-27 (×9): qty 2

## 2023-09-27 MED ORDER — VITAMIN C 500 MG PO TABS
500.0000 mg | ORAL_TABLET | Freq: Every day | ORAL | Status: DC
  Administered 2023-09-27 – 2023-10-05 (×9): 500 mg via ORAL
  Filled 2023-09-27 (×9): qty 1

## 2023-09-27 MED ORDER — VITAMIN D3 125 MCG (5000 UT) PO TABS: 1.0000 | ORAL_TABLET | Freq: Every day | ORAL | Status: DC

## 2023-09-27 NOTE — ED Triage Notes (Signed)
 Pt to ED under IVC brought by [place] [employer]. Family took out IVC papers on pt because pt is paranoid, not eating or drinking, son thinks dehydrated and pt would not agree to come to ED so they took out papers on her.   Hx dementia. Had UTI recently. Unable to get medical history from pt.

## 2023-09-27 NOTE — ED Notes (Addendum)
 Pt ambulated to toilet via two person assist and back to bed. Pt placed in clean brief.

## 2023-09-27 NOTE — ED Provider Notes (Signed)
 [facility] Provider Note    Event Date/Time   First MD Initiated Contact with Patient 09/27/23 1734     (approximate)  History   Chief Complaint: IVC and Altered Mental Status  HPI  [name] is a 83 y.o. female with a medical history of dementia, bipolar, depression, hyperlipidemia, diabetes, presents to the emergency department under IVC for altered mental status.  According to the son the patient has had worsening dementia symptoms as well as paranoia.  Son states he had to take out IVC paperwork to get the patient to come to the emergency department for evaluation.  Son states recently patient has been walking outside of the house, recently found the patient downstairs with a butcher knife acting paranoid as if someone was trying to get her.  Son states he still wishes for the patient to live at home with him however he believes that the patient may need additional medication to help with her worsening dementia and paranoia symptoms.  Physical Exam   Triage Vital Signs: ED Triage Vitals  Encounter Vitals Group     BP 09/27/23 1623 (!) 141/67     Girls Systolic BP Percentile --      Girls Diastolic BP Percentile --      Boys Systolic BP Percentile --      Boys Diastolic BP Percentile --      Pulse Rate 09/27/23 1623 88     Resp 09/27/23 1623 20     Temp 09/27/23 1623 99 F (37.2 C)     Temp Source 09/27/23 1623 Oral     SpO2 09/27/23 1623 96 %     Weight 09/27/23 1625 149 lb 14.6 oz (68 kg)     Height 09/27/23 1625 5' 3 (1.6 m)     Head Circumference --      Peak Flow --      Pain Score --      Pain Loc --      Pain Education --      Exclude from Growth Chart --     Most recent vital signs: Vitals:   09/27/23 1623  BP: (!) 141/67  Pulse: 88  Resp: 20  Temp: 99 F (37.2 C)  SpO2: 96%    General: Awake, no distress.  Calm and cooperative. CV:  Good peripheral perfusion.  Regular rate and rhythm  Resp:  Normal effort.  Equal  breath sounds bilaterally.  Abd:  No distention.    ED Results / Procedures / Treatments   MEDICATIONS ORDERED IN ED: Medications - No data to display   IMPRESSION / MDM / ASSESSMENT AND PLAN / ED COURSE  I reviewed the triage vital signs and the nursing notes.  Patient's presentation is most consistent with acute presentation with potential threat to life or bodily function.  Patient presents to the emergency department for worsening confusion dementia symptoms and paranoia.  Currently patient is calm and cooperative without any complaints.  Patient CBC is reassuring/baseline, chemistry shows renal insufficiency but largely chronic.  Slight signs of dehydration with anion gap of 13 however the patient is asking for something to eat and drink will attempt to hydrate and feed orally.  Patient's chest x-ray shows possible bronchiectatic changes patient has no shortness of breath satting in the upper 90s on room air.  We will obtain a urine sample.  Will maintain the IVC and have psychiatry evaluate to see if there is anything additionally that the patient could benefit  from as far as medications to help this on manage at home.  Will continue to closely monitor while awaiting results.  Patient's urinalysis is normal no signs of infection.  We continue to await psychiatry evaluation to help with disposition.  FINAL CLINICAL IMPRESSION(S) / ED DIAGNOSES   Dementia Paranoia   Note:  This document was prepared using Dragon voice recognition software and may include unintentional dictation errors.   Ruth Cove, MD 09/27/23 2016

## 2023-09-27 NOTE — Consult Note (Signed)
 [facility] Psychiatric Consult Initial  Patient Name: [name]  MRN: [id number]  DOB: 18-Nov-1940  Consult Order details:  Orders (From admission, onward)     Start     Ordered   09/27/23 1745  CONSULT TO CALL ACT TEAM       Ordering Provider: Ruth Cove, MD  Provider:  (Not yet assigned)  Question:  Reason for Consult?  Answer:  Psych consult   09/27/23 1745   09/27/23 1745  IP CONSULT TO PSYCHIATRY       Ordering Provider: Ruth Cove, MD  Provider:  (Not yet assigned)  Question Answer Comment  Consult Timeframe ROUTINE - requires response within 24 hours   Reason for Consult? Consult for medication management   Contact phone number where the requesting provider can be reached [phone]      09/27/23 1745             Mode of Visit: Tele-visit Virtual Statement:TELE PSYCHIATRY ATTESTATION & CONSENT As the provider for this telehealth consult, I attest that I verified the patient's identity using two separate identifiers, introduced myself to the patient, provided my credentials, disclosed my location, and performed this encounter via a HIPAA-compliant, real-time, face-to-face, two-way, interactive audio and video platform and with the full consent and agreement of the patient (or guardian as applicable.) Patient physical location: [facility] . Telehealth provider physical location: home office in state of [place].   Video start time: 938 Video end time: 1005    Psychiatry Consult Evaluation  Service Date: September 27, 2023 LOS:  LOS: 0 days  Chief Complaint   Primary Psychiatric Diagnoses  F03.90 - Unspecified dementia, without behavioral disturbance  F32.9 - Major depressive disorder, single episode, unspecified  Assessment  [name] is an 83 year old woman with dementia, bipolar disorder, depression, hyperlipidemia, and diabetes, presenting under involuntary commitment for altered mental status. The son reports progressive cognitive decline coupled with  increasing paranoia, evidenced by wandering outdoors and a recent incident where she brandished a Engineer, water, fearing harm. Insight and judgment are impaired, but no self-harm or harm to others is reported. Her mood symptoms are consistent with depressive features, and self-care has deteriorated. The goal is to exclude acute medical causes (e.g., infection, metabolic derangements), stabilize her psychiatric symptoms--especially paranoia and possible mood dysregulation--enhance nutrition and hydration, and implement safety measures. A comprehensive plan involves medical workup, psychiatric treatment with possible medication adjustment, environmental and caregiver support, and discharge planning with close follow-up.   Plan   ## Psychiatric Medication Recommendations:  Evaluate and likely reintroduce or adjust antipsychotic/mood stabilizer for paranoia and bipolar disorder; low-dose risperidone or quetiapine may be considered.  Continue/supportive therapy with cognitive orientation, reality-spreading reassurance, structured daily routines, and caregiver education.  Safety Planning:  Implement increased supervision and remove access to potential weapons (e.g., kitchen knives).  Admission may be necessary if risk cannot be managed safely at home.  Family Involvement:  Engage son in daily structure, safety planning, medication education, and home environmental modifications.    ## Medical Decision Making Capacity: Not specifically addressed in this encounter  ## Disposition: Pending medical results and psychiatric stabilization.  Prefer inpatient hospitalization if supervision is inadequate at home; otherwise, discharge with close outpatient psychiatry, home health services, and a dementia care plan.  ## Behavioral / Environmental: - No specific recommendations at this time.     ## Safety and Observation Level:  - Based on my clinical evaluation, I estimate the patient to be at low  risk of  self harm in the current setting. - At this time, we recommend  routine. This decision is based on my review of the chart including patient's history and current presentation, interview of the patient, mental status examination, and consideration of suicide risk including evaluating suicidal ideation, plan, intent, suicidal or self-harm behaviors, risk factors, and protective factors. This judgment is based on our ability to directly address suicide risk, implement suicide prevention strategies, and develop a safety plan while the patient is in the clinical setting. Please contact our team if there is a concern that risk level has changed.  CSSR Risk Category:C-SSRS RISK CATEGORY: No Risk  Thank you for this consult request. Recommendations have been communicated to the primary team.  We will reassess pending collateral at this time.   Suzanne Alias, NP       History of Present Illness  [name] is an 83 year old female with a past medical history of dementia, bipolar disorder, depression, hyperlipidemia, and diabetes. She was brought to the emergency department involuntarily due to altered mental status. The son reports worsening dementia symptoms, new-onset paranoia, and disorientation. He describes episodes of wandering outside unsupervised and a concerning incident where she was found downstairs with a butcher knife, acting fearfully as if someone was attempting to harm her. The son is motivated to continue care at home but acknowledges the need for medication adjustments. The patient also has a decreased appetite and is consuming mainly liquids.   Psych ROS:  Mood/Affect: Depressive features suspected (low energy, appetite) Sleep: Not assessed at present Energy/Appetite: Reduced oral intake; consuming fluids and protein shakes Cognition: Worsened orientation, intermittent confusion, paranoid ideation Perception: Denies hallucinations; paranoid behavior noted Thought Content: No  suicidal or homicidal ideation; paranoia present Self-Care: Admits deterioration in hygiene and nutrition    Review of Systems  All other systems reviewed and are negative.    Psychiatric and Social History  Psychiatric History:  Information collected from son Chronic dementia, bipolar disorder, and major depressive disorder No recent psychiatric hospitalizations prior to this presentation No history of suicide attempts or self-harm behaviors  Social History:  Lives at home. Son wants her live with him  Substance History denies  Exam Findings  Physical Exam:  Vital Signs:  Temp:  [99 F (37.2 C)] 99 F (37.2 C) (06/14 1623) Pulse Rate:  [88] 88 (06/14 1623) Resp:  [20] 20 (06/14 1623) BP: (141)/(67) 141/67 (06/14 1623) SpO2:  [96 %] 96 % (06/14 1623) Weight:  [68 kg] 68 kg (06/14 1625) Blood pressure (!) 141/67, pulse 88, temperature 99 F (37.2 C), temperature source Oral, resp. rate 20, height 5' 3 (1.6 m), weight 68 kg, SpO2 96%. Body mass index is 26.56 kg/m.  Physical Exam Vitals and nursing note reviewed.   Mental Status Exam:  Appearance: Elderly female, well-groomed, appropriate attire Behavior: Cooperative; good eye contact Speech: Clear and coherent; slightly slowed Mood: Neutral; Affect: Appropriate; limited range observed Thought Process: Logical but concrete Thought Content: Paranoid ideation (belief of external threat), no delusions suicide/homicide Perception: No auditory or visual hallucinations reported Cognition: Orientation: Oriented to self, place, date; not oriented to current president Memory: Long-term recall intact; short-term memory mildly impaired Attention/Concentration: Mildly reduced Insight/Judgment: Poor at times; recognizes need for help with self-care Nutrition/Hydration: Poor oral intake of solids; adequate fluid consumption   Other History   These have been pulled in through the EMR, reviewed, and updated if appropriate.   Family History:  The patient's family history includes Heart disease in her  sister.  Medical History: Past Medical History:  Diagnosis Date   Alzheimer dementia (HCC)    unknown stage   Arthritis    hands, feet, knees, shoulders (05/05/2014)   Bipolar disorder (HCC)    Cataracts, bilateral    immature   Chronic kidney disease    dr says they aren't functioning like they should (05/05/2014)   Coronary artery disease    Depression    takes Zoloft  daily   History of bronchitis 54yrs ago   Hyperlipidemia    takes Atorvastatin  daily   Hypertension    takes Metoprolol ,Lisinopril ,and Apresoline  daily   Joint pain    Peripheral edema    takes Chlothalidone daily   Squamous cell cancer of skin of nose 2012   right   Squamous cell cancer of skin of nose    Type II diabetes mellitus (HCC)    takes Tradjenta  daily    Surgical History: Past Surgical History:  Procedure Laterality Date   COLONOSCOPY WITH PROPOFOL  N/A 02/03/2018   Procedure: COLONOSCOPY WITH PROPOFOL ;  Surgeon: Deveron Fly, MD;  [facility] END[facility]COPY;  Service: Endoscopy;  Laterality: N/A;   CORONARY ARTERY BYPASS GRAFT  05/12/2007   CABG X1; at [facility]   coronary artery bypass with arterial grafts     DILATION AND CURETTAGE OF UTERUS  2005   FRACTURE SURGERY     I & D EXTREMITY Right 04/30/2014   Procedure: IRRIGATION AND DEBRIDEMENT EXTREMITY;  Surgeon: Amada Backer, MD;  Location: [facility] OR;  Service: Orthopedics;  Laterality: Right;   INCISION AND DRAINAGE Right 08/11/2014   Procedure: REMOVAL OF DEEP IMPLANTS OF RIGHT TIBIAL FIBIAL INCISION AND DRAINAGE OF RIGHT ANKLE WOUND;  Surgeon: Amada Backer, MD;  Location: [facility] OR;  Service: Orthopedics;  Laterality: Right;   ORIF ANKLE FRACTURE Right 04/30/2014   Procedure: OPEN REDUCTION INTERNAL FIXATION (ORIF) ANKLE FRACTURE;  Surgeon: Amada Backer, MD;  Location: [facility] OR;  Service: Orthopedics;  Laterality: Right;   REMOVAL OF IMPLANT Right 08/11/2014   Procedure:  REMOVAL OF DEEP IMPLANTS OF RIGHT TIBIAL FIBIAL INCISION AND DRAINAGE OF RIGHT ANKLE WOUND;  Surgeon: Amada Backer, MD;  Location: [facility] OR;  Service: Orthopedics;  Laterality: Right;   SQUAMOUS CELL CARCINOMA EXCISION Right 2012   nose   VAGINAL HYSTERECTOMY  2007     Medications:  No current facility-administered medications for this encounter.  Current Outpatient Medications:    amLODipine  (NORVASC ) 10 MG tablet, TAKE 1 TABLET(10 MG) BY MOUTH DAILY, Disp: 90 tablet, Rfl: 1   Ascorbic Acid (VITAMIN C PO), Take 1 tablet by mouth daily., Disp: , Rfl:    aspirin  EC 81 MG tablet, Take 81 mg by mouth daily. Swallow whole., Disp: , Rfl:    cephALEXin  (KEFLEX ) 500 MG capsule, Take 1 capsule (500 mg total) by mouth 2 (two) times daily., Disp: 12 capsule, Rfl: 0   Cholecalciferol  (VITAMIN D3) 5000 units TABS, Take 1 tablet by mouth daily., Disp: , Rfl:    feeding supplement (ENSURE ENLIVE / ENSURE PLUS) LIQD, Take 237 mLs by mouth 2 (two) times daily between meals., Disp: 237 mL, Rfl: 12   furosemide  (LASIX ) 20 MG tablet, TAKE 1 TABLET(20 MG) BY MOUTH DAILY AS NEEDED, Disp: 90 tablet, Rfl: 0   gabapentin  (NEURONTIN ) 300 MG capsule, Take 1 capsule (300 mg total) by mouth daily in the afternoon. (Patient not taking: Reported on 07/04/2023), Disp: 90 capsule, Rfl: 0   lidocaine  (LIDODERM ) 5 %, Place 1 patch onto the skin at bedtime.  Remove & Discard patch within 12 hours or as directed by MD (Patient not taking: Reported on 09/05/2023), Disp: 30 patch, Rfl: 0   pioglitazone  (ACTOS ) 15 MG tablet, TAKE 1 TABLET BY MOUTH EVERY MORNING, Disp: 90 tablet, Rfl: 1   rosuvastatin  (CRESTOR ) 40 MG tablet, TAKE 1 TABLET BY MOUTH EVERY DAY, Disp: 30 tablet, Rfl: 0   sertraline  (ZOLOFT ) 50 MG tablet, TAKE 1 TABLET(50 MG) BY MOUTH EVERY MORNING, Disp: 90 tablet, Rfl: 0   tiZANidine (ZANAFLEX) 4 MG capsule, Take 4 mg by mouth 3 (three) times daily as needed for muscle spasms. (Patient not taking: Reported on 09/05/2023),  Disp: , Rfl:   Allergies: Allergies  Allergen Reactions   Codeine Nausea And Vomiting    Makes me deathly sick    Suzanne Alias, NP

## 2023-09-27 NOTE — ED Triage Notes (Signed)
 Arrives from home via [facility] under IVC paperwork.  VS wnl.

## 2023-09-28 ENCOUNTER — Other Ambulatory Visit: Payer: Self-pay

## 2023-09-28 DIAGNOSIS — N390 Urinary tract infection, site not specified: Secondary | ICD-10-CM | POA: Diagnosis not present

## 2023-09-28 DIAGNOSIS — F600 Paranoid personality disorder: Secondary | ICD-10-CM | POA: Diagnosis not present

## 2023-09-28 DIAGNOSIS — F0390 Unspecified dementia without behavioral disturbance: Secondary | ICD-10-CM | POA: Diagnosis not present

## 2023-09-28 LAB — CBG MONITORING, ED
Glucose-Capillary: 83 mg/dL (ref 70–99)
Glucose-Capillary: 72 mg/dL (ref 70–99)
Glucose-Capillary: 121 mg/dL — ABNORMAL HIGH (ref 70–99)

## 2023-09-28 NOTE — ED Notes (Signed)
 Fall precautions in place for Pt. This RN placed fall band, fall grip socks, bed alarm and fall sign.

## 2023-09-28 NOTE — BH Assessment (Signed)
 Comprehensive Clinical Assessment (CCA) Screening, Triage and Referral Note  09/28/2023 [name] [id number] Recommendations for Services/Supports/Treatments: Consulted with Suzanne D., NP, who determined pt's disposition pending is pending collateral.  [name] is an 83 year old, English speaking, white female with a hx of dementia. Pt is IVC'd. Per triage note: Pt to ED under IVC brought by [name] PD. Family took out IVC papers on pt because pt is paranoid, not eating or drinking, son thinks dehydrated and pt would not agree to come to ED so they took out papers on her. Hx dementia. Had UTI recently. Unable to get medical history from pt. On assessment, pt. reported feeling good. When asked why she'd presented to the ED, the pt stated, "My son thinks I have a kidney infection and I'm not taking care of myself. Pt admitted to having a decreased appetite; however, pt denied any other concerns. Pt expressed a belief that nothing is wrong with her. Pt was oriented x4.  Pt had fair insight. Thought processes were linear and pt.'s speech was relevant. Pt had a euthymic mood and a responsive affect. Pt denied having SI, HI, AV/H, or symptoms of paranoia.   Collateral: Writer unsuccessful in contacting pt's son Suzanne, Mason. [phone]) A HIPAA compliant voicemail was left requesting a call back.    Chief Complaint:  Chief Complaint  Patient presents with   IVC   Altered Mental Status   Visit Diagnosis: F03.90 - Unspecified dementia, without behavioral disturbance  F32.9 - Major depressive disorder, single episode, unspecified    Patient Reported Information How did you hear about us ? Family/Friend  What Is the Reason for Your Visit/Call Today? BIB EMS from home.  Pt lives with son. Son reports pt has been increasingly altered and has not allowed him to change her in about 4 days.  EMS reported they had responded a few times to the address but pt refused transport.  Pt was IVCd  by son and brought in by EMS at this time  How Long Has This Been Causing You Problems? <Week  What Do You Feel Would Help You the Most Today? -- (Pt unable to identify needs.)   Have You Recently Had Any Thoughts About Hurting Yourself? No  Are You Planning to Commit Suicide/Harm Yourself At This time? No   Have you Recently Had Thoughts About Hurting Someone [name]? No  Are You Planning to Harm Someone at This Time? No  Explanation: Pt denied SI/HI   Have You Used Any Alcohol or Drugs in the Past 24 Hours? No  How Long Ago Did You Use Drugs or Alcohol? No data recorded What Did You Use and How Much? No data recorded  Do You Currently Have a Therapist/Psychiatrist? No  Name of Therapist/Psychiatrist: No data recorded  Have You Been Recently Discharged From Any Office Practice or Programs? No  Explanation of Discharge From Practice/Program: No data recorded   CCA Screening Triage Referral Assessment Type of Contact: Face-to-Face  Telemedicine Service Delivery:   Is this Initial or Reassessment?   Date Telepsych consult ordered in [facility]:    Time Telepsych consult ordered in [facility]:    Location of Assessment: [facility] ED  Provider Location: [facility] ED    Collateral Involvement: None provided   Does Patient Have a Court Appointed Legal Guardian? No data recorded Name and Contact of Legal Guardian: No data recorded If Minor and Not Living with Parent(s), Who has Custody? n/a  Is CPS involved or ever been involved? Never  Is  APS involved or ever been involved? Never   Patient Determined To Be At Risk for Harm To Self or Others Based on Review of Patient Reported Information or Presenting Complaint? No  Method: No Plan  Availability of Means: No access or NA  Intent: No data recorded Notification Required: No need or identified person  Additional Information for Danger to Others Potential: -- (n/a)  Additional Comments for Danger to Others Potential: n/a  Are There  Guns or Other Weapons in Your Home? No  Types of Guns/Weapons: n/a  Are These Weapons Safely Secured?                            -- (n/a)  Who Could Verify You Are Able To Have These Secured: n/a  Do You Have any Outstanding Charges, Pending Court Dates, Parole/Probation? UTA  Contacted To Inform of Risk of Harm To Self or Others: -- (n/a)   Does Patient Present under Involuntary Commitment? Yes    [place] of Residence: [place]   Patient Currently Receiving the Following Services: Not Receiving Services   Determination of Need: Emergent (2 hours)   Options For Referral: ED Visit   Disposition Recommendation per psychiatric provider: Pending collateral contact.   Meldrick Buttery R Syniah Berne, LCAS

## 2023-09-28 NOTE — ED Notes (Signed)
 Pt ate an apple sauce and some of her dinner

## 2023-09-28 NOTE — ED Provider Notes (Signed)
 Emergency Medicine Observation Re-evaluation Note  [name] is a 83 y.o. female, seen on rounds today.  Pt initially presented to the ED for complaints of IVC and Altered Mental Status  Currently, the patient is resting in bed. No reported issues from nursing team.   Physical Exam  BP (!) 146/49   Pulse 65   Temp 98.8 F (37.1 C) (Oral)   Resp 18   Ht 5' 3 (1.6 m)   Wt 68 kg   SpO2 95%   BMI 26.56 kg/m  Physical Exam General: Resting in bed  ED Course / MDM   Labs reassuring with mild anemia, stable renal impairment, urinalysis not suggestive of infection.  A urine culture was performed with low colony growth of Proteus Mirabella's and E. coli.  Low suspicion that this is reflective of to infection, will hold off on treatment for now.  Plan  Current plan is for dispo per psychiatry.    Claria Crofts, MD 09/28/23 2040

## 2023-09-28 NOTE — ED Notes (Signed)
 This tech assisted pt. to the bathroom at this time. Bed was changed and replaced with new sheet and pad. Pt. was cleaned up and a new brief and pants was placed.

## 2023-09-28 NOTE — ED Notes (Signed)
Tray ordered for Pt

## 2023-09-28 NOTE — ED Notes (Addendum)
 Pt cleaned up after this RN saw pts brief wet. New brief, bed pads, pants, and sheets applied. Pt declined brushing her teeth at this time.

## 2023-09-28 NOTE — ED Notes (Signed)
 IVC, psych consult complete.  Pending dispo

## 2023-09-29 DIAGNOSIS — N390 Urinary tract infection, site not specified: Secondary | ICD-10-CM | POA: Diagnosis not present

## 2023-09-29 DIAGNOSIS — F0390 Unspecified dementia without behavioral disturbance: Secondary | ICD-10-CM | POA: Diagnosis not present

## 2023-09-29 DIAGNOSIS — F600 Paranoid personality disorder: Secondary | ICD-10-CM | POA: Diagnosis not present

## 2023-09-29 LAB — URINE CULTURE: Culture: 20000 — AB

## 2023-09-29 LAB — CBG MONITORING, ED
Glucose-Capillary: 103 mg/dL — ABNORMAL HIGH (ref 70–99)
Glucose-Capillary: 187 mg/dL — ABNORMAL HIGH (ref 70–99)

## 2023-09-29 NOTE — ED Notes (Signed)
 This tech and tech [name] changed pt and helped her to the toilet. Changed bed sheet.

## 2023-09-29 NOTE — ED Provider Notes (Signed)
 Emergency Medicine Observation Re-evaluation Note  TAVARIA MACKINS is a 83 y.o. female, seen on rounds today.  Pt initially presented to the ED for complaints of IVC and Altered Mental Status Currently, the patient is Resting, voices no medical complaints.  Physical Exam  BP 127/64   Pulse 65   Temp 98.8 F (37.1 C) (Oral)   Resp 18   Ht 5' 3 (1.6 m)   Wt 68 kg   SpO2 95%   BMI 26.56 kg/m  Physical Exam General: Resting in no acute distress Cardiac: No cyanosis Lungs: Equal rise and fall Psych: Not agitated  ED Course / MDM  EKG:   I have reviewed the labs performed to date as well as medications administered while in observation.  Recent changes in the last 24 hours include no events overnight.  Plan  Current plan is for psychiatric disposition.    Clovis Warwick J, MD 09/29/23 [phone]

## 2023-09-29 NOTE — ED Notes (Signed)
 This RN called pt's son with an update.

## 2023-09-29 NOTE — ED Notes (Signed)
 ivc/pending medical results and psychiatric stabilization.

## 2023-09-29 NOTE — ED Notes (Signed)
 Pt Breakfast provided at bedside

## 2023-09-29 NOTE — ED Notes (Addendum)
 Pt Lunch provided at bedside

## 2023-09-30 DIAGNOSIS — F600 Paranoid personality disorder: Secondary | ICD-10-CM | POA: Diagnosis not present

## 2023-09-30 DIAGNOSIS — F0390 Unspecified dementia without behavioral disturbance: Secondary | ICD-10-CM | POA: Diagnosis not present

## 2023-09-30 DIAGNOSIS — N390 Urinary tract infection, site not specified: Secondary | ICD-10-CM | POA: Diagnosis not present

## 2023-09-30 LAB — CBG MONITORING, ED
Glucose-Capillary: 140 mg/dL — ABNORMAL HIGH (ref 70–99)
Glucose-Capillary: 160 mg/dL — ABNORMAL HIGH (ref 70–99)
Glucose-Capillary: 168 mg/dL — ABNORMAL HIGH (ref 70–99)

## 2023-09-30 MED ORDER — CEFUROXIME AXETIL 250 MG PO TABS
250.0000 mg | ORAL_TABLET | Freq: Every day | ORAL | Status: DC
  Administered 2023-09-30 – 2023-10-04 (×5): 250 mg via ORAL
  Filled 2023-09-30 (×6): qty 1

## 2023-09-30 NOTE — ED Notes (Signed)
 Patient assisted to the bedside commode with one person assist, and assisted back to bed.

## 2023-09-30 NOTE — ED Notes (Signed)
 Patient assisted to use [facility] by this RN and Waller, EDT. Patient urinated and was cleaned up. Patient's brief was changed and patient was returned to bed. Bed in lowest position, call bell within reach, and bed alarm active.

## 2023-09-30 NOTE — ED Notes (Signed)
 Pt given snack.

## 2023-09-30 NOTE — ED Provider Notes (Signed)
 Emergency Medicine Observation Re-evaluation Note  [name] is a 83 y.o. female, seen on rounds today.  Pt initially presented to the ED for complaints of IVC and Altered Mental Status Currently, the patient is resting, voices no medical complaints.  Physical Exam  BP (!) 124/46   Pulse 60   Temp 98.7 F (37.1 C) (Oral)   Resp 16   Ht 5' 3 (1.6 m)   Wt 68 kg   SpO2 98%   BMI 26.56 kg/m  Physical Exam General: Resting in no acute distress Cardiac: No cyanosis Lungs: Equal rise and fall Psych: Not agitated  ED Course / MDM  EKG:   I have reviewed the labs performed to date as well as medications administered while in observation.  Recent changes in the last 24 hours include appreciate pharmacy consult for antibiotic recommendation for positive urine culture.  Plan  Current plan is for psychiatric disposition.    Merrissa Giacobbe J, MD 09/30/23 [phone]

## 2023-09-30 NOTE — ED Notes (Signed)
 Patient assisted to use [facility] by this RN and [place], EDT. Patient urinated and was cleaned up. Patient's brief was changed and patient was returned to bed. Bed in lowest position, call bell within reach, and bed alarm active.

## 2023-09-30 NOTE — ED Notes (Signed)
 Pt given a bed bath with soap and water. New brief, bed pads, sheets, and paper scrubs applied.

## 2023-09-30 NOTE — ED Notes (Signed)
IVC/PENDING DISPO 

## 2023-10-01 DIAGNOSIS — F0390 Unspecified dementia without behavioral disturbance: Secondary | ICD-10-CM | POA: Diagnosis not present

## 2023-10-01 DIAGNOSIS — F600 Paranoid personality disorder: Secondary | ICD-10-CM | POA: Diagnosis not present

## 2023-10-01 DIAGNOSIS — N390 Urinary tract infection, site not specified: Secondary | ICD-10-CM | POA: Diagnosis not present

## 2023-10-01 LAB — CBG MONITORING, ED: Glucose-Capillary: 186 mg/dL — ABNORMAL HIGH (ref 70–99)

## 2023-10-01 MED ORDER — LORAZEPAM 2 MG/ML IJ SOLN
1.0000 mg | Freq: Once | INTRAMUSCULAR | Status: AC
  Administered 2023-10-01: 1 mg via INTRAMUSCULAR
  Filled 2023-10-01: qty 1

## 2023-10-01 NOTE — ED Notes (Addendum)
 This NT assisted pt to bedside commode. Pt had urinated on herself, so new brief and scrub pants were provided to pt. Pt is now resting comfortably in bed.

## 2023-10-01 NOTE — ED Notes (Signed)
 IVC/ pending disp

## 2023-10-01 NOTE — ED Notes (Signed)
 Fall precautions in place for Pt. This RN placed fall band, fall grip socks, bed alarm and fall sign.

## 2023-10-01 NOTE — ED Notes (Signed)
 Pt is a high fall risk. Pt has fall risk bracelet, non skid socks, and bed alarm is on. Fall bundle is in pace at this time.

## 2023-10-01 NOTE — ED Notes (Signed)
 Pt is Type II DM, boarding in ED, and also a CBG check. No insulin  coverage at the moment, and she's been eating 85%-100% of meals. DM consult placed, EDP aware and in agreement. Holding Ensures until Pt is seen by DM coordinator.

## 2023-10-01 NOTE — ED Notes (Signed)
 Per TTS,  [facility] made 2 unsuccessful attempts to call patient's son for collateral Maida Widger - [phone]).  John's voicemail is full.   [facility] will follow-up.  [place], [facility] [phone]

## 2023-10-01 NOTE — Inpatient Diabetes Management (Addendum)
 Inpatient Diabetes Program Recommendations  AACE/ADA: New Consensus Statement on Inpatient Glycemic Control (2015)  Target Ranges:  Prepandial:   less than 140 mg/dL      Peak postprandial:   less than 180 mg/dL (1-2 hours)      Critically ill patients:  140 - 180 mg/dL    Latest Reference Range & Units 07/04/23 01:00  Hemoglobin A1C 4.8 - 5.6 % 5.1    Latest Reference Range & Units 09/30/23 09:41 09/30/23 11:58 09/30/23 18:40  Glucose-Capillary 70 - 99 mg/dL 956 (H) 213 (H) 086 (H)  (H): Data is abnormally high  Latest Reference Range & Units 10/01/23 09:24  Glucose-Capillary 70 - 99 mg/dL 578 (H)  (H): Data is abnormally high   Admit with: IVC brought by [place] PD/ AMS  History: DM2, Dementia  Home DM Meds: Actos  15 mg daily  Current Orders: Actos  15 mg daily    MD- Please place orders for Novolog  0-6 units TID AC  (Use Glycemic Control Order set)     --Will follow patient during hospitalization--  Langston Pippins RN, MSN, CDCES Diabetes Coordinator Inpatient Glycemic Control Team Team Pager: [phone] (8a-5p)

## 2023-10-01 NOTE — ED Provider Notes (Signed)
 Emergency Medicine Observation Re-evaluation Note  [name] is a 83 y.o. female, seen on rounds today.  Pt initially presented to the ED for complaints of IVC and Altered Mental Status  Currently, the patient is is no acute distress. Denies any concerns at this time.  Physical Exam  Blood pressure (!) 145/51, pulse 67, temperature 98.4 F (36.9 C), temperature source Oral, resp. rate 18, height 5' 3 (1.6 m), weight 68 kg, SpO2 96%.  Physical Exam: General: No apparent distress Pulm: Normal WOB Neuro: Moving all extremities Psych: Resting comfortably     ED Course / MDM     I have reviewed the labs performed to date as well as medications administered while in observation.  Recent changes in the last 24 hours include: No acute events overnight.  Plan   Current plan: Patient awaiting psychiatric disposition.   It appears patient was last seen by psychiatry on 09/27/2023 with plans to possibly reintroduce or adjust antipsychotic/mood stabilizers, continued supportive therapy, safety planning and possible inpatient hospitalization.  There have been no updates since that time.  I did place another psychiatric consult for further recommendations.   [name], Suzanne Dao, DO 10/01/23 [phone]

## 2023-10-01 NOTE — BH Assessment (Signed)
 TTS attempted to contact the patient's son Autry Legions D. Biebel-[phone]) to obtained collateral information but was unable to reach him. TTS was unable to leave voicemail message to request a return phone call because the mailbox was full.

## 2023-10-01 NOTE — ED Notes (Signed)
 Pt cleaned up, pericare applied, bed bath provided, new attire, socks, bedding and warm blanket provided. Pt able to wash her face and brush her teeth. Foam lift applied. No event. Pt needs max assist as she is very unstable on her feet. See documentation of blanchable pressure injury. Fall precautions re-intiated and CB in reach.

## 2023-10-01 NOTE — ED Notes (Signed)
 Pt got out of bed, bed alarm was on but did not go off. Pt walking down hallway pushing linen cart with walker on top stating she is wanting to leave and threatening staff verbally. Pt stating she is going to blow this place up pt is attempting to leave out of EMS bay, stating she is going to ram the linen cart and walker through the doors to open them. Walker and linen cart taken away from patient. Pt attempting to hit this EDT and Fabian Holster, EDT. Research officer, trade union and Natalie, Charity fundraiser at patient side. Pt placed in wheelchair, medicated by RN and taken to rm 25. Pt refusing to get out of wheelchair into bed, quad officer, security x1 and Ace Abu RN picked pt up and placed her on bed. Pt attempting to hit this tech again. Blankets given and lights dimmed for comfort.

## 2023-10-01 NOTE — ED Notes (Signed)
Pt ate 100% of breakfast.

## 2023-10-01 NOTE — ED Notes (Signed)
 Pt asleep at this time, PM snack to be offered once pt awakens.

## 2023-10-01 NOTE — ED Notes (Signed)
 IVC/  PENDING  PLACEMENT

## 2023-10-01 NOTE — ED Notes (Signed)
 [facility] was unable to obtain collateral today.  Unsuccessful follow-up calls were made to contact patient's son Arita Severtson - [phone]).  John's voicemail is currently full.    [place], [facility] [phone]

## 2023-10-01 NOTE — ED Provider Notes (Signed)
 Patient left her room and was trying to get out of the building. Patient was quite agitated and threatening to hit staff as they tried to redirect her back to her room. Because of this she was given a medication to help calm her down, she was able to be put back in her room and calmed down significantly.   Marylynn Soho, MD 10/01/23 [phone]

## 2023-10-02 DIAGNOSIS — N390 Urinary tract infection, site not specified: Secondary | ICD-10-CM | POA: Diagnosis not present

## 2023-10-02 DIAGNOSIS — F600 Paranoid personality disorder: Secondary | ICD-10-CM | POA: Diagnosis not present

## 2023-10-02 DIAGNOSIS — F0390 Unspecified dementia without behavioral disturbance: Secondary | ICD-10-CM | POA: Diagnosis not present

## 2023-10-02 LAB — CBG MONITORING, ED
Glucose-Capillary: 90 mg/dL (ref 70–99)
Glucose-Capillary: 90 mg/dL (ref 70–99)
Glucose-Capillary: 152 mg/dL — ABNORMAL HIGH (ref 70–99)

## 2023-10-02 MED ORDER — ACETAMINOPHEN 325 MG PO TABS
650.0000 mg | ORAL_TABLET | Freq: Once | ORAL | Status: AC
  Administered 2023-10-02: 650 mg via ORAL
  Filled 2023-10-02: qty 2

## 2023-10-02 MED ORDER — CITALOPRAM HYDROBROMIDE 20 MG PO TABS
10.0000 mg | ORAL_TABLET | Freq: Every day | ORAL | Status: AC
  Administered 2023-10-02 – 2023-10-03 (×2): 10 mg via ORAL
  Filled 2023-10-02 (×2): qty 1

## 2023-10-02 MED ORDER — SERTRALINE HCL 50 MG PO TABS
25.0000 mg | ORAL_TABLET | Freq: Every day | ORAL | Status: AC
  Administered 2023-10-03: 25 mg via ORAL
  Filled 2023-10-02: qty 1

## 2023-10-02 MED ORDER — INSULIN ASPART 100 UNIT/ML IJ SOLN
0.0000 [IU] | Freq: Three times a day (TID) | INTRAMUSCULAR | Status: DC
  Administered 2023-10-02 – 2023-10-05 (×4): 1 [IU] via SUBCUTANEOUS
  Filled 2023-10-02 (×5): qty 1

## 2023-10-02 MED ORDER — CITALOPRAM HYDROBROMIDE 20 MG PO TABS
20.0000 mg | ORAL_TABLET | Freq: Every day | ORAL | Status: DC
  Administered 2023-10-04 – 2023-10-05 (×2): 20 mg via ORAL
  Filled 2023-10-02 (×2): qty 1

## 2023-10-02 NOTE — ED Notes (Signed)
 Pt provided pericare. Linens changed. Pt sat up in bed for lunch.

## 2023-10-02 NOTE — BH Assessment (Signed)
 TTS requested [name] telepsych consult.

## 2023-10-02 NOTE — Inpatient Diabetes Management (Signed)
 Inpatient Diabetes Program Recommendations  AACE/ADA: New Consensus Statement on Inpatient Glycemic Control (2015)  Target Ranges:  Prepandial:   less than 140 mg/dL      Peak postprandial:   less than 180 mg/dL (1-2 hours)      Critically ill patients:  140 - 180 mg/dL    Latest Reference Range & Units 09/30/23 09:41 09/30/23 11:58 09/30/23 18:40  Glucose-Capillary 70 - 99 mg/dL 161 (H) 096 (H) 045 (H)  (H): Data is abnormally high  Latest Reference Range & Units 10/01/23 09:24  Glucose-Capillary 70 - 99 mg/dL 409 (H)  (H): Data is abnormally high  Admit with: IVC brought by [place] PD/ AMS   History: DM2, Dementia   Home DM Meds: Actos  15 mg daily   Current Orders: Actos  15 mg daily       MD- Please place orders for Novolog  0-6 units TID AC   (Use Glycemic Control Order set)   --Will follow patient during hospitalization--  Langston Pippins RN, MSN, CDCES Diabetes Coordinator Inpatient Glycemic Control Team Team Pager: [phone] (8a-5p)

## 2023-10-02 NOTE — ED Provider Notes (Signed)
 Emergency Medicine Observation Re-evaluation Note   BP (!) 133/50   Pulse (!) 55   Temp 98.6 F (37 C) (Axillary)   Resp 18   Ht 5' 3 (1.6 m)   Wt 68 kg   SpO2 97%   BMI 26.56 kg/m    ED Course / MDM   No reported events during my shift at the time of this note.   Pt is awaiting dispo from consultants   Buell Carmin MD    Buell Carmin, MD 10/02/23 [phone]

## 2023-10-02 NOTE — ED Notes (Signed)
 [facility] received a call back from patient's son Autry Legions "Goble Last" Salle - [phone]) to obtain collateral.  Goble Last stated he made a visit to see his mom today in the ED and she was not in a good mood. "She refused to talk to me and continuously looked at me in a hateful way".  Goble Last stated he asked patient what was wrong, and her only response was "What!".  He told her he loved her but received no response.  On last Sunday, Goble Last stated the visit was much more pleasant.  She was in a pretty good mood and talked quite a bit.  At this time, he also observed her talk to an imaginary person on her imaginary phone.  Goble Last reported the patient would talk to different imaginary people from time to time while communicating as if she was living in years past.  Goble Last reported he has noticed a decline in his mom's health since the death of his brother (her favorite son).  His brother lived in the house with their mom, however brother passed away about 4 years ago.  Since his brother's death, mom has lived alone and has isolated herself inside the home refusing to do the things she once enjoyed.  Goble Last stated he has not lived at home with mom since he was 13 years old.  On the days he is working locally, he visits mom in the mornings, at lunch time and at supper time to ensure she has food to eat and takes medication.  He also has to encourage her often to use the restroom and take showers.  With work, he also travels out of town or state.  On these days, the neighbors try to keep an eye on his mom and reports to him or wife if she goes outdoors.  Goble Last and his wife are the patient's only family.    Goble Last states that with mom's confusion getting worse, he is afraid she may wander out into the road and get hit by a vehicle one day.  He tries to ensure all potential weapons are put away; however, she was able to find a pair of scissors and butcher knife in the past.  He is concerned for her safety and feels she does not have  adequate supervision at home each day.  Goble Last is interested in finding other long term placement options for his mom where she could be managed safely around the clock.     [place], [facility] [phone]

## 2023-10-02 NOTE — ED Notes (Signed)
 Breakfast was given at bedside

## 2023-10-02 NOTE — ED Notes (Signed)
 Pt is a high fall risk. Pt has fall risk bracelet, non skid socks, and bed alarm on at this time.

## 2023-10-02 NOTE — ED Notes (Signed)
 Pt given breakfast. Pt set up for breakfast. Pt declining wanting to eat at this time time. Breakfast left set up for pt.

## 2023-10-02 NOTE — Progress Notes (Signed)
 [name] Follow-Up Consult Note  Patient Name: [name] MRN: [id number] DOB: [birth year] DATE OF Consult: 10/02/2023  PRIMARY PSYCHIATRIC DIAGNOSES  1.  Major neurocognitive disorder with behavioral exacerbation    RECOMMENDATIONS  Please see initial Psychiatric Consult Note for full assessment and initial treatment recommendations. Additional recommendations:  Recommendations: Medication recommendations: Discontinue Sertraline . Start Citalopram at 10 mg x 2 days then increase to 20 mg daily. Citalopram has better evidence for agitation in dementia. If Qtc ,500, nay increase to 30 mg daily after 1 week at the 20 mg dose.  Is inpatient psychiatric hospitalization recommended for this patient? Yes (Explain why): for safety and stabilization. Recommend discharging to a memory care facility.  Follow-Up Telepsychiatry C/L services: We will sign off for now. Please re-consult our service if needed for any concerning changes in the patient's condition, discharge planning, or questions.  Thank you for involving us  in the care of this patient. If you have any additional questions or concerns, please call [phone] and ask for me or the provider on-call.  TELEPSYCHIATRY ATTESTATION & CONSENT  As the provider for this telehealth consult, I attest that I verified the patient's identity using two separate identifiers, introduced myself to the patient, provided my credentials, disclosed my location, and performed this encounter via a HIPAA-compliant, real-time, face-to-face, two-way, interactive audio and video platform and with the full consent and agreement of the patient (or guardian as applicable.)  Patient physical location: [facility] Emergency Department at [facility] . Telehealth provider physical location: home office in state of [place] .  Video start time: 7 pm CST (Central Time) Video end time: 7:30 pm  (Central Time)  IDENTIFYING DATA  [name] is a 83 y.o.  year-old female for whom a psychiatric consultation has been ordered by the primary provider. The patient was identified using two separate identifiers.  CHIEF COMPLAINT/REASON FOR CONSULT  Dementia qith behavioral dyscontrol   INTERVAL HISTORY  Please refer to initial consult note for full history, assessment, and treatment.  Based on my follow-up encounter with this patient today, the patient is calm today and pleasant but unable to fully engage in interview. She mostly looks at the screen and smiles. No matter what questions I ask, she tells me about carrots and potatoes .   Pertinent info/background from recent psychiatric consults: IVC in place pending placement   PERTINENT UPDATES TO PMH, SH/SUDH, FH  Son considering placement. Recommend memory care facility   HOME MEDICATIONS   Current Facility-Administered Medications:    amLODipine  (NORVASC ) tablet 10 mg, 10 mg, Oral, Daily, Paduchowski, Kevin, MD, 10 mg at 10/02/23 8295   ascorbic acid (VITAMIN C) tablet 500 mg, 500 mg, Oral, Daily, Paduchowski, Kevin, MD, 500 mg at 10/02/23 0908   aspirin  EC tablet 81 mg, 81 mg, Oral, Daily, Ruth Cove, MD, 81 mg at 10/02/23 0908   cefUROXime (CEFTIN) tablet 250 mg, 250 mg, Oral, Q0600, Sung, Jade J, MD, 250 mg at 10/02/23 0910   cholecalciferol  (VITAMIN D3) 25 MCG (1000 UNIT) tablet 1,000 Units, 1,000 Units, Oral, Daily, Ruth Cove, MD, 1,000 Units at 10/02/23 0909   citalopram (CELEXA) tablet 10 mg, 10 mg, Oral, Daily **FOLLOWED BY** [START ON 10/04/2023] citalopram (CELEXA) tablet 20 mg, 20 mg, Oral, Daily, Zhyon Antenucci C, MD   feeding supplement (ENSURE ENLIVE / ENSURE PLUS) liquid 237 mL, 237 mL, Oral, BID BM, Paduchowski, Kevin, MD, 237 mL at 10/02/23 1454   insulin  aspart (novoLOG ) injection 0-6 Units, 0-6 Units, Subcutaneous, TID WC,  Claria Crofts, MD, 1 Units at 10/02/23 1638   pioglitazone  (ACTOS ) tablet 15 mg, 15 mg, Oral, q morning, Paduchowski, Kevin, MD, 15 mg at 10/02/23  0908   rosuvastatin  (CRESTOR ) tablet 40 mg, 40 mg, Oral, Daily, Paduchowski, Kevin, MD, 40 mg at 10/02/23 1820   [START ON 10/03/2023] sertraline  (ZOLOFT ) tablet 25 mg, 25 mg, Oral, Daily, Jil Mosses, Kahley Leib C, MD  Current Outpatient Medications:    amLODipine  (NORVASC ) 10 MG tablet, TAKE 1 TABLET(10 MG) BY MOUTH DAILY, Disp: 90 tablet, Rfl: 1   Ascorbic Acid (VITAMIN C PO), Take 1 tablet by mouth daily., Disp: , Rfl:    aspirin  EC 81 MG tablet, Take 81 mg by mouth daily. Swallow whole., Disp: , Rfl:    Cholecalciferol  (VITAMIN D3) 5000 units TABS, Take 1 tablet by mouth daily., Disp: , Rfl:    furosemide  (LASIX ) 20 MG tablet, TAKE 1 TABLET(20 MG) BY MOUTH DAILY AS NEEDED, Disp: 90 tablet, Rfl: 0   pioglitazone  (ACTOS ) 15 MG tablet, TAKE 1 TABLET BY MOUTH EVERY MORNING, Disp: 90 tablet, Rfl: 1   rosuvastatin  (CRESTOR ) 40 MG tablet, TAKE 1 TABLET BY MOUTH EVERY DAY, Disp: 30 tablet, Rfl: 0   sertraline  (ZOLOFT ) 50 MG tablet, TAKE 1 TABLET(50 MG) BY MOUTH EVERY MORNING, Disp: 90 tablet, Rfl: 0   cephALEXin  (KEFLEX ) 500 MG capsule, Take 1 capsule (500 mg total) by mouth 2 (two) times daily. (Patient not taking: Reported on 10/01/2023), Disp: 12 capsule, Rfl: 0   feeding supplement (ENSURE ENLIVE / ENSURE PLUS) LIQD, Take 237 mLs by mouth 2 (two) times daily between meals., Disp: 237 mL, Rfl: 12   gabapentin  (NEURONTIN ) 300 MG capsule, Take 1 capsule (300 mg total) by mouth daily in the afternoon. (Patient not taking: Reported on 07/04/2023), Disp: 90 capsule, Rfl: 0   lidocaine  (LIDODERM ) 5 %, Place 1 patch onto the skin at bedtime. Remove & Discard patch within 12 hours or as directed by MD (Patient not taking: Reported on 09/05/2023), Disp: 30 patch, Rfl: 0   tiZANidine (ZANAFLEX) 4 MG capsule, Take 4 mg by mouth 3 (three) times daily as needed for muscle spasms. (Patient not taking: Reported on 09/05/2023), Disp: , Rfl:    ALLERGIES  Allergies  Allergen Reactions   Codeine Nausea And Vomiting    Makes  me deathly sick    CURRENT MENTAL STATUS EXAM (MSE)  Mental Status Exam: General Appearance: Casual  Orientation:  Negative  Memory:  Recent;   Poor  Concentration:  Concentration: Poor  Recall:  Poor  Attention  Poor  Eye Contact:  Good  Speech:  soft  Language:  Good  Volume:  Normal  Mood: aloof  Affect:  Appropriate  Thought Process:  Descriptions of Associations: Loose  Thought Content:  NA  Suicidal Thoughts:  No  Homicidal Thoughts:  No  Judgement:  Impaired  Insight:  Lacking  Psychomotor Activity:  Normal  Akathisia:  No  Fund of Knowledge:  Poor    Assets:  Social Support  Cognition:  Impaired,  Moderate  ADL's:  Impaired  AIMS (if indicated):       VITALS  Blood pressure (!) 149/51, pulse 62, temperature 98.6 F (37 C), temperature source Oral, resp. rate 18, height 5' 3 (1.6 m), weight 68 kg, SpO2 97%.  LABS  Admission on 09/27/2023  Component Date Value Ref Range Status   Sodium 09/27/2023 141  135 - 145 mmol/L Final   Potassium 09/27/2023 3.5  3.5 - 5.1 mmol/L Final  Chloride 09/27/2023 106  98 - 111 mmol/L Final   CO2 09/27/2023 22  22 - 32 mmol/L Final   Glucose, Bld 09/27/2023 116 (H)  70 - 99 mg/dL Final   Glucose reference range applies only to samples taken after fasting for at least 8 hours.   BUN 09/27/2023 40 (H)  8 - 23 mg/dL Final   Creatinine, Ser 09/27/2023 1.62 (H)  0.44 - 1.00 mg/dL Final   Calcium  09/27/2023 11.1 (H)  8.9 - 10.3 mg/dL Final   Total Protein 86/57/8469 7.6  6.5 - 8.1 g/dL Final   Albumin 62/95/2841 4.4  3.5 - 5.0 g/dL Final   AST 32/44/0102 31  15 - 41 U/L Final   ALT 09/27/2023 19  0 - 44 U/L Final   Alkaline Phosphatase 09/27/2023 59  38 - 126 U/L Final   Total Bilirubin 09/27/2023 1.0  0.0 - 1.2 mg/dL Final   GFR, Estimated 09/27/2023 31 (L)  >60 mL/min Final   Comment: (NOTE) Calculated using the CKD-EPI Creatinine Equation (2021)    Anion gap 09/27/2023 13  5 - 15 Final   Performed at [facility],  [address] [place], [place] [address]   WBC 09/27/2023 8.6  4.0 - 10.5 K/uL Final   RBC 09/27/2023 4.08  3.87 - 5.11 MIL/uL Final   Hemoglobin 09/27/2023 11.9 (L)  12.0 - 15.0 g/dL Final   HCT 64/40/3474 36.4  36.0 - 46.0 % Final   MCV 09/27/2023 89.2  80.0 - 100.0 fL Final   MCH 09/27/2023 29.2  26.0 - 34.0 pg Final   MCHC 09/27/2023 32.7  30.0 - 36.0 g/dL Final   RDW 25/95/6387 14.5  11.5 - 15.5 % Final   Platelets 09/27/2023 146 (L)  150 - 400 K/uL Final   nRBC 09/27/2023 0.0  0.0 - 0.2 % Final   Neutrophils Relative % 09/27/2023 78  % Final   Neutro Abs 09/27/2023 6.7  1.7 - 7.7 K/uL Final   Lymphocytes Relative 09/27/2023 16  % Final   Lymphs Abs 09/27/2023 1.4  0.7 - 4.0 K/uL Final   Monocytes Relative 09/27/2023 6  % Final   Monocytes Absolute 09/27/2023 0.5  0.1 - 1.0 K/uL Final   Eosinophils Relative 09/27/2023 0  % Final   Eosinophils Absolute 09/27/2023 0.0  0.0 - 0.5 K/uL Final   Basophils Relative 09/27/2023 0  % Final   Basophils Absolute 09/27/2023 0.0  0.0 - 0.1 K/uL Final   Immature Granulocytes 09/27/2023 0  % Final   Abs Immature Granulocytes 09/27/2023 0.03  0.00 - 0.07 K/uL Final   Performed at [facility], [address] [place], [place] [address]   Specimen Source 09/27/2023 URINE, CLEAN CATCH   Final   Color, Urine 09/27/2023 YELLOW (A)  YELLOW Final   APPearance 09/27/2023 HAZY (A)  CLEAR Final   Specific Gravity, Urine 09/27/2023 1.012  1.005 - 1.030 Final   pH 09/27/2023 5.0  5.0 - 8.0 Final   Glucose, UA 09/27/2023 NEGATIVE  NEGATIVE mg/dL Final   Hgb urine dipstick 09/27/2023 NEGATIVE  NEGATIVE Final   Bilirubin Urine 09/27/2023 NEGATIVE  NEGATIVE Final   Ketones, ur 09/27/2023 NEGATIVE  NEGATIVE mg/dL Final   Protein, ur 29/51/8841 NEGATIVE  NEGATIVE mg/dL Final   Nitrite 66/09/3014 NEGATIVE  NEGATIVE Final   Leukocytes,Ua 09/27/2023 NEGATIVE  NEGATIVE Final   RBC / HPF 09/27/2023 0-5  0 - 5 RBC/hpf Final   WBC, UA 09/27/2023 0-5  0  - 5  WBC/hpf Final   Comment:        Reflex urine culture not performed if WBC <=10, OR if Squamous epithelial cells >5. If Squamous epithelial cells >5 suggest recollection.    Bacteria, UA 09/27/2023 NONE SEEN  NONE SEEN Final   Squamous Epithelial / HPF 09/27/2023 0-5  0 - 5 /HPF Final   Mucus 09/27/2023 PRESENT   Final   Performed at [facility], [address], [place], [place] [address]   Specimen Description 09/27/2023    Final                   Value:URINE, CLEAN CATCH Performed at [facility], [address], [place], [place] [address]    Special Requests 09/27/2023    Final                   Value:NONE Performed at [facility], [address] [place], [place] [address]    Culture 09/27/2023  (A)   Final                   Value:20,000 COLONIES/mL PROTEUS MIRABILIS 10,000 COLONIES/mL ESCHERICHIA COLI    Report Status 09/27/2023 09/29/2023 FINAL   Final   Organism ID, Bacteria 09/27/2023 PROTEUS MIRABILIS (A)   Final   Organism ID, Bacteria 09/27/2023 ESCHERICHIA COLI (A)   Final   Glucose-Capillary 09/28/2023 83  70 - 99 mg/dL Final   Glucose reference range applies only to samples taken after fasting for at least 8 hours.   Glucose-Capillary 09/28/2023 72  70 - 99 mg/dL Final   Glucose reference range applies only to samples taken after fasting for at least 8 hours.   Glucose-Capillary 09/28/2023 121 (H)  70 - 99 mg/dL Final   Glucose reference range applies only to samples taken after fasting for at least 8 hours.   Glucose-Capillary 09/29/2023 103 (H)  70 - 99 mg/dL Final   Glucose reference range applies only to samples taken after fasting for at least 8 hours.   Glucose-Capillary 09/29/2023 187 (H)  70 - 99 mg/dL Final   Glucose reference range applies only to samples taken after fasting for at least 8 hours.   Glucose-Capillary 09/30/2023 140 (H)  70 - 99 mg/dL Final   Glucose reference range applies only to samples taken after  fasting for at least 8 hours.   Glucose-Capillary 09/30/2023 160 (H)  70 - 99 mg/dL Final   Glucose reference range applies only to samples taken after fasting for at least 8 hours.   Glucose-Capillary 09/30/2023 168 (H)  70 - 99 mg/dL Final   Glucose reference range applies only to samples taken after fasting for at least 8 hours.   Glucose-Capillary 10/01/2023 186 (H)  70 - 99 mg/dL Final   Glucose reference range applies only to samples taken after fasting for at least 8 hours.   Glucose-Capillary 10/02/2023 90  70 - 99 mg/dL Final   Glucose reference range applies only to samples taken after fasting for at least 8 hours.   Glucose-Capillary 10/02/2023 90  70 - 99 mg/dL Final   Glucose reference range applies only to samples taken after fasting for at least 8 hours.   Comment 1 10/02/2023 Notify RN   Final   Comment 2 10/02/2023 Document in Chart   Final   Glucose-Capillary 10/02/2023 152 (H)  70 - 99 mg/dL Final   Glucose reference range applies only to samples taken after fasting for at least 8  hours.    PSYCHIATRIC REVIEW OF SYSTEMS (ROS)  ROS: Notable for the following relevant positive findings: ROS  Additional findings:      Musculoskeletal: Impaired      Gait & Station: Wheelchair/Walker  RISK FORMULATION/ASSESSMENT  Is the patient experiencing any suicidal or homicidal ideations: No   Protective factors considered for safety management: Supportive son   Risk factors/concerns considered for safety management:  Age over 81 Impulsivity  Is there a safety management plan with the patient and treatment team to minimize risk factors and promote protective factors: Yes           Explain: inpatient admission for safety and stabilization followed by memory care facility  Is crisis care placement or psychiatric hospitalization recommended: Yes     Based on my current evaluation and risk assessment, patient is determined at this time to be at:  Moderate Risk  *RISK  ASSESSMENT Risk assessment is a dynamic process; it is possible that this patient's condition, and risk level, may change. This should be re-evaluated and managed over time as appropriate. Please re-consult psychiatric consult services if additional assistance is needed in terms of risk assessment and management. If your team decides to discharge this patient, please advise the patient how to best access emergency psychiatric services, or to call 911, if their condition worsens or they feel unsafe in any way.   Davey Erp, MD Telepsychiatry Consult Services Patient ID: [name], female   DOB: 05/18/1940, 83 y.o.   MRN: [id number]

## 2023-10-02 NOTE — ED Notes (Signed)
 Dinner was given to pt at bedside

## 2023-10-02 NOTE — ED Notes (Signed)
 Pt was sleeping sp snack was not given

## 2023-10-02 NOTE — ED Provider Notes (Signed)
 Most recent BGLs 140-187. SSI 0-6 units TID AC added per recommendations from diabetes coordinator.    Claria Crofts, MD 10/02/23 [phone]

## 2023-10-02 NOTE — ED Notes (Signed)
 IVC/  PENDING  PLACEMENT

## 2023-10-02 NOTE — ED Notes (Signed)
 Pt was given a lunch tray at bedside and she is eating her lunch.

## 2023-10-02 NOTE — ED Notes (Signed)
 IVC/Pending Placement

## 2023-10-02 NOTE — ED Notes (Signed)
 Pt given lunch

## 2023-10-02 NOTE — ED Notes (Signed)
 Pt cleaned of urine at this time, new brief and clothes placed on pt. New linen on bed

## 2023-10-02 NOTE — ED Provider Notes (Signed)
-----------------------------------------   3:58 PM on 10/02/2023 -----------------------------------------   Blood pressure (!) 149/51, pulse 62, temperature 98.6 F (37 C), temperature source Oral, resp. rate 18, height 5' 3 (1.6 m), weight 68 kg, SpO2 97%.  The patient is calm and cooperative at this time.  There have been no acute events since the last update.  Awaiting disposition plan from consultants.   Kandee Orion, MD 10/02/23 [phone]

## 2023-10-03 DIAGNOSIS — N390 Urinary tract infection, site not specified: Secondary | ICD-10-CM | POA: Diagnosis not present

## 2023-10-03 DIAGNOSIS — F22 Delusional disorders: Secondary | ICD-10-CM | POA: Diagnosis not present

## 2023-10-03 DIAGNOSIS — F0390 Unspecified dementia without behavioral disturbance: Secondary | ICD-10-CM

## 2023-10-03 DIAGNOSIS — F600 Paranoid personality disorder: Secondary | ICD-10-CM | POA: Diagnosis not present

## 2023-10-03 LAB — CBG MONITORING, ED
Glucose-Capillary: 99 mg/dL (ref 70–99)
Glucose-Capillary: 95 mg/dL (ref 70–99)
Glucose-Capillary: 126 mg/dL — ABNORMAL HIGH (ref 70–99)
Glucose-Capillary: 120 mg/dL — ABNORMAL HIGH (ref 70–99)
Glucose-Capillary: 91 mg/dL (ref 70–99)

## 2023-10-03 MED ORDER — LORAZEPAM 1 MG PO TABS
1.0000 mg | ORAL_TABLET | Freq: Once | ORAL | Status: DC
  Filled 2023-10-03: qty 1

## 2023-10-03 NOTE — ED Notes (Signed)
 This tech and RN Carolynne Citron assisted pt in using bedside commode. Pt brief soiled. Brief, sheets, and gown changed at this time. Pt is now back in bed with no further needs.

## 2023-10-03 NOTE — ED Notes (Signed)
 Served pt lunch with sweet tea.

## 2023-10-03 NOTE — ED Notes (Signed)
Pt awake in bed.

## 2023-10-03 NOTE — ED Notes (Signed)
Pt   Awake in bed

## 2023-10-03 NOTE — ED Notes (Signed)
 Pt attempting to get OOB at this time. When this tech asked pt where she was going, pt replied, out of this building because it is crashing down. This tech reassured pt that she was in the hospital and is safe. Pt assisted to slide back in bed. Call light within reach. Pt has no further needs at this time.
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
## 2023-10-03 NOTE — ED Notes (Signed)
 Pt set up for breakfast

## 2023-10-03 NOTE — ED Notes (Signed)
 Served pt dinner with iced tea.

## 2023-10-03 NOTE — ED Notes (Signed)
Pt in bed sleeping

## 2023-10-03 NOTE — ED Notes (Signed)
 This RN checked pts brief for any incontinence. Brief was dry. Pt is calm and in a good mood.

## 2023-10-03 NOTE — BH Assessment (Signed)
 Destination  Service Provider Request Status Services Address Phone Fax Patient Preferred  [facility]  Pending - Request Sent -- [address] [place] [address] [phone] --  [facility]  Pending - Request Sent -- [address] [place] [address] [phone] --  [facility]  Pending - Request Sent -- [address]ella Na[address] [place] [address] [phone] --  [facility]  Pending - Request Sent -- [address] [place] [address] [phone] --  [facility]  Pending - Request Sent -- [address] [place] [address] [phone] --  [facility]  Pending - Request Sent -- [address] [place] [address] [phone] --  [facility]  Pending - Request Sent -- [address], [place] [address] [phone] --  [facility]  Pending - Request Sent -- [address]ain Drown [place] [address] [phone] --

## 2023-10-03 NOTE — ED Notes (Signed)
 Pt incontinent of bladder. Sheets, gown, and brief changed. Pericare provided. Pt tolerated well. Stretcher in lowest position with wheels locked. Side rails up x 2.

## 2023-10-03 NOTE — ED Provider Notes (Signed)
 Emergency Medicine Observation Re-evaluation Note   BP 132/64 (BP Location: Right Arm)   Pulse 72   Temp 99.2 F (37.3 C) (Oral)   Resp 16   Ht 5' 3 (1.6 m)   Wt 68 kg   SpO2 96%   BMI 26.56 kg/m    ED Course / MDM   No reported events during my shift at the time of this note.   Pt is awaiting dispo from consultants   Buell Carmin MD    Buell Carmin, MD 10/03/23 [phone]

## 2023-10-03 NOTE — ED Notes (Signed)
 IVC pending placement papers to expire Sat 6/21

## 2023-10-03 NOTE — Consult Note (Signed)
 [facility] Psychiatric Consult Follow-up  Patient Name: .[name]  MRN: [id number]  DOB: [birth year]  Consult Order details:  Orders (From admission, onward)     Start     Ordered   10/01/23 1505  CONSULT TO CALL ACT TEAM       Ordering Provider: Kandee Orion, MD  Provider:  (Not yet assigned)  Question:  Reason for Consult?  Answer:  psych consult   10/01/23 1504   10/01/23 0349  IP CONSULT TO PSYCHIATRY       Ordering Provider: Dewitt Forehand, DO  Provider:  (Not yet assigned)  Question Answer Comment  Place call to: was awaiting psych dispo, hasnt been a psych note in 3 days   Reason for Consult Admit      10/01/23 0348   09/27/23 1745  CONSULT TO CALL ACT TEAM       Ordering Provider: Ruth Cove, MD  Provider:  (Not yet assigned)  Question:  Reason for Consult?  Answer:  Psych consult   09/27/23 1745   09/27/23 1745  IP CONSULT TO PSYCHIATRY       Ordering Provider: Ruth Cove, MD  Provider:  (Not yet assigned)  Question Answer Comment  Consult Timeframe ROUTINE - requires response within 24 hours   Reason for Consult? Consult for medication management   Contact phone number where the requesting provider can be reached [phone]      09/27/23 1745             Mode of Visit: In person    Psychiatry Consult Evaluation  Service Date: October 03, 2023 LOS:  LOS: 0 days  Chief Complaint Dementia   Primary Psychiatric Diagnoses  Dementia 2.  Paranoia   Assessment  [name] is a 83 y.o. female admitted: Presented to the EDfor 09/27/2023  5:02 PM brought into the emergency department by [place] [employer] [employer] under IVC taken out by family for paranoia and decreased oral intake. She carries the psychiatric diagnoses of depression and has a past medical history of dementia, HTN, CAD, CHF, NSTEMI, DM2, hyperparathyroidism, CKD, anemia and hyperlipidemia.   Her current presentation of refusing to engage with provider is most consistent with dementia and  an altered mental status. She does meet criteria for inpatient psychiatric hospitalization based on potential her condition is exacerbated by depression. Current outpatient psychotropic medications include zoloft  and historically she has had an unknown response to these medications. Compliance with medications prior to admission is undetermined. On initial examination, patient refuses to speak or engage in any fashion with provider. Please see plan below for detailed recommendations.   Diagnoses:  Active Hospital problems: Active Problems:   Dementia (HCC)   Paranoia (HCC)    Plan   ## Psychiatric Medication Recommendations:  Continue  --zoloft   --celexa  ## Medical Decision Making Capacity: Not specifically addressed in this encounter  ## Further Work-up:  -- most recent EKG on 09/09/2023 had QtC of 541 -- Pertinent labwork reviewed earlier this admission includes: CBC, CMP and UA    ## Disposition:-- We recommend inpatient psychiatric hospitalization after medical hospitalization. Patient has been involuntarily committed on 09/27/2023. Patient will need a memory care unit after hospitalization for safety.    ## Behavioral / Environmental: -Delirium Precautions: Delirium Interventions for Nursing and Staff: - RN to open blinds every AM. - To Bedside: Glasses, hearing aide, and pt's own shoes. Make available to patients. when possible and encourage use. - Encourage po fluids when appropriate, keep  fluids within reach. - OOB to chair with meals. - Passive ROM exercises to all extremities with AM & PM care. - RN to assess orientation to person, time and place QAM and PRN. - Recommend extended visitation hours with familiar family/friends as feasible. - Staff to minimize disturbances at night. Turn off television when pt asleep or when not in use.    ## Safety and Observation Level:  - Based on my clinical evaluation, I estimate the patient to be at low risk of self harm in the current  setting. - At this time, we recommend  routine. This decision is based on my review of the chart including patient's history and current presentation, interview of the patient, mental status examination, and consideration of suicide risk including evaluating suicidal ideation, plan, intent, suicidal or self-harm behaviors, risk factors, and protective factors. This judgment is based on our ability to directly address suicide risk, implement suicide prevention strategies, and develop a safety plan while the patient is in the clinical setting. Please contact our team if there is a concern that risk level has changed.  CSSR Risk Category:C-SSRS RISK CATEGORY: No Risk  Suicide Risk Assessment: Patient has following modifiable risk factors for suicide: none, which we are addressing by recommending placement in a memory care unit. Patient has following non-modifiable or demographic risk factors for suicide: none Patient has the following protective factors against suicide: Supportive family  Thank you for this consult request. Recommendations have been communicated to the primary team.  We will continue to follow at this time.   Jeraline Moment, NP       History of Present Illness  Relevant Aspects of Hospital ED Course:  Admitted on 09/27/2023 brought into the emergency department by [place] [employer] [employer] under IVC taken out by family for paranoia and decreased oral intake. She carries the psychiatric diagnoses of depression and has a past medical history of dementia, HTN, CAD, CHF, NSTEMI, DM2, hyperparathyroidism, CKD, anemia and hyperlipidemia.   Patient Report:   [name], is seen face to face by this provider, consulted with Dr. Brenita Callow; and chart reviewed on 10/03/23.  On evaluation [name] does not participate. Patient refuses to speak with provider. Just prior to assessment patient had an episode of incontinence and staff cleaned and changed patient.  Nursing notes indicate patient is  confused and impulsive. There is evidence of sundowning.    During evaluation [name] is laying in bed in no acute distress.  She is alert & oriented to self. She is calm, but did not cooperate  Her mood is unknown with congruent affect.  Speech is unknown.  Patient would not answer any questions.    I personally spent a total of 60 minutes in the care of the patient today including preparing to see the patient, getting/reviewing separately obtained history, performing a medically appropriate exam/evaluation, referring and communicating with other health care professionals, documenting clinical information in the EHR, independently interpreting results, communicating results, and coordinating care.  Psych ROS:  Depression: history of Anxiety:  history of  Mania (lifetime and current): unknown Psychosis: (lifetime and current): unknown  Review of Systems  Musculoskeletal:  Positive for falls.  Neurological:  Positive for weakness.  Psychiatric/Behavioral:  Positive for depression.   All other systems reviewed and are negative.    Psychiatric and Social History  Psychiatric History:  Information collected from chart review  Prev Dx/Sx: depression Current Psych Provider: none Home Meds (current): zoloft  Previous Med Trials: unknown  Therapy: unknown  Prior Psych Hospitalization: unknown  Prior Self Harm: unknown Prior Violence: unknown  Family Psych History: unknown Family Hx suicide: unknown  Social History:  Developmental Hx: unknown Educational Hx: unknown Occupational Hx: unemployed Armed forces operational officer Hx: unknown Living Situation: lives alone previous to ED admission Spiritual Hx: unknown Access to weapons/lethal means: unknown   Substance History Alcohol: unknown  Tobacco: unknown Illicit drugs: unknown Prescription drug abuse: unknown Rehab hx: unknown  Exam Findings  Physical Exam:  Vital Signs:  Temp:  [98.5 F (36.9 C)-99.2 F (37.3 C)] 98.5 F (36.9 C)  (06/20 0934) Pulse Rate:  [62-74] 74 (06/20 0934) Resp:  [16-18] 18 (06/20 0934) BP: (132-149)/(51-71) 148/71 (06/20 0936) SpO2:  [96 %-97 %] 96 % (06/20 0934) Blood pressure (!) 148/71, pulse 74, temperature 98.5 F (36.9 C), temperature source Oral, resp. rate 18, height 5' 3 (1.6 m), weight 68 kg, SpO2 96%. Body mass index is 26.56 kg/m.  Physical Exam Vitals and nursing note reviewed.   Eyes:     Pupils: Pupils are equal, round, and reactive to light.    Skin:    General: Skin is dry.   Neurological:     Mental Status: She is alert.     Gait: Gait abnormal.     Comments: Uses a walker  Psychiatric:        Speech: She is noncommunicative.        Behavior: Behavior is uncooperative.        Cognition and Memory: Cognition is impaired. Memory is impaired.        Judgment: Judgment is impulsive.     Mental Status Exam: General Appearance: Disheveled  Orientation:  Other:  UTA  Memory:  UTA  Concentration:  UTA  Recall:  UTA  Attention  Other: UTA  Eye Contact:  Minimal  Speech:  UTA  Language:  UTA  Volume:  UTA  Mood: UTA  Affect:  UTA  Thought Process:  UTA  Thought Content:  UTA  Suicidal Thoughts:  UTA  Homicidal Thoughts:  UTA  Judgement:  UTA  Insight:  UTA  Psychomotor Activity:  UTA  Akathisia:  UTA  Fund of Knowledge:  UTA      Assets:  Housing Social Support  Cognition:  UTA  ADL's:  Impaired  AIMS (if indicated):        Other History   These have been pulled in through the EMR, reviewed, and updated if appropriate.  Family History:  The patient's family history includes Heart disease in her sister.  Medical History: Past Medical History:  Diagnosis Date   Alzheimer dementia (HCC)    unknown stage   Arthritis    hands, feet, knees, shoulders (05/05/2014)   Bipolar disorder (HCC)    Cataracts, bilateral    immature   Chronic kidney disease    dr says they aren't functioning like they should (05/05/2014)   Coronary artery  disease    Depression    takes Zoloft  daily   History of bronchitis 56yrs ago   Hyperlipidemia    takes Atorvastatin  daily   Hypertension    takes Metoprolol ,Lisinopril ,and Apresoline  daily   Joint pain    Peripheral edema    takes Chlothalidone daily   Squamous cell cancer of skin of nose 2012   right   Squamous cell cancer of skin of nose    Type II diabetes mellitus (HCC)    takes Tradjenta  daily    Surgical History: Past Surgical History:  Procedure Laterality Date  COLONOSCOPY WITH PROPOFOL  N/A 02/03/2018   Procedure: COLONOSCOPY WITH PROPOFOL ;  Surgeon: Deveron Fly, MD;  Location: [facility] ENDOSCOPY;  Service: Endoscopy;  Laterality: N/A;   CORONARY ARTERY BYPASS GRAFT  05/12/2007   CABG X1; at [facility]   coronary artery bypass with arterial grafts     DILATION AND CURETTAGE OF UTERUS  2005   FRACTURE SURGERY     I & D EXTREMITY Right 04/30/2014   Procedure: IRRIGATION AND DEBRIDEMENT EXTREMITY;  Surgeon: Amada Backer, MD;  Location: [facility] OR;  Service: Orthopedics;  Laterality: Right;   INCISION AND DRAINAGE Right 08/11/2014   Procedure: REMOVAL OF DEEP IMPLANTS OF RIGHT TIBIAL FIBIAL INCISION AND DRAINAGE OF RIGHT ANKLE WOUND;  Surgeon: Amada Backer, MD;  Location: [facility] OR;  Service: Orthopedics;  Laterality: Right;   ORIF ANKLE FRACTURE Right 04/30/2014   Procedure: OPEN REDUCTION INTERNAL FIXATION (ORIF) ANKLE FRACTURE;  Surgeon: Amada Backer, MD;  Location: [facility] OR;  Service: Orthopedics;  Laterality: Right;   REMOVAL OF IMPLANT Right 08/11/2014   Procedure: REMOVAL OF DEEP IMPLANTS OF RIGHT TIBIAL FIBIAL INCISION AND DRAINAGE OF RIGHT ANKLE WOUND;  Surgeon: Amada Backer, MD;  Location: [facility] OR;  Service: Orthopedics;  Laterality: Right;   SQUAMOUS CELL CARCINOMA EXCISION Right 2012   nose   VAGINAL HYSTERECTOMY  2007     Medications:   Current Facility-Administered Medications:    amLODipine  (NORVASC ) tablet 10 mg, 10 mg, Oral, Daily, Paduchowski, Kevin, MD, 10 mg at  10/03/23 0936   ascorbic acid (VITAMIN C) tablet 500 mg, 500 mg, Oral, Daily, Paduchowski, Kevin, MD, 500 mg at 10/03/23 8295   aspirin  EC tablet 81 mg, 81 mg, Oral, Daily, Ruth Cove, MD, 81 mg at 10/03/23 0936   cefUROXime (CEFTIN) tablet 250 mg, 250 mg, Oral, Q0600, Sung, Jade J, MD, 250 mg at 10/03/23 6213   cholecalciferol  (VITAMIN D3) 25 MCG (1000 UNIT) tablet 1,000 Units, 1,000 Units, Oral, Daily, Ruth Cove, MD, 1,000 Units at 10/03/23 0865   [COMPLETED] citalopram (CELEXA) tablet 10 mg, 10 mg, Oral, Daily, 10 mg at 10/03/23 0938 **FOLLOWED BY** [START ON 10/04/2023] citalopram (CELEXA) tablet 20 mg, 20 mg, Oral, Daily, Leal, Janette C, MD   feeding supplement (ENSURE ENLIVE / ENSURE PLUS) liquid 237 mL, 237 mL, Oral, BID BM, Paduchowski, Kevin, MD, 237 mL at 10/02/23 1454   insulin  aspart (novoLOG ) injection 0-6 Units, 0-6 Units, Subcutaneous, TID WC, Ray, Neha, MD, 1 Units at 10/02/23 1638   pioglitazone  (ACTOS ) tablet 15 mg, 15 mg, Oral, q morning, Paduchowski, Kevin, MD, 15 mg at 10/03/23 7846   rosuvastatin  (CRESTOR ) tablet 40 mg, 40 mg, Oral, Daily, Paduchowski, Kevin, MD, 40 mg at 10/02/23 1820  Current Outpatient Medications:    amLODipine  (NORVASC ) 10 MG tablet, TAKE 1 TABLET(10 MG) BY MOUTH DAILY, Disp: 90 tablet, Rfl: 1   Ascorbic Acid (VITAMIN C PO), Take 1 tablet by mouth daily., Disp: , Rfl:    aspirin  EC 81 MG tablet, Take 81 mg by mouth daily. Swallow whole., Disp: , Rfl:    Cholecalciferol  (VITAMIN D3) 5000 units TABS, Take 1 tablet by mouth daily., Disp: , Rfl:    furosemide  (LASIX ) 20 MG tablet, TAKE 1 TABLET(20 MG) BY MOUTH DAILY AS NEEDED, Disp: 90 tablet, Rfl: 0   pioglitazone  (ACTOS ) 15 MG tablet, TAKE 1 TABLET BY MOUTH EVERY MORNING, Disp: 90 tablet, Rfl: 1   rosuvastatin  (CRESTOR ) 40 MG tablet, TAKE 1 TABLET BY MOUTH EVERY DAY, Disp: 30 tablet, Rfl: 0   sertraline  (  ZOLOFT ) 50 MG tablet, TAKE 1 TABLET(50 MG) BY MOUTH EVERY MORNING, Disp: 90 tablet,  Rfl: 0   cephALEXin  (KEFLEX ) 500 MG capsule, Take 1 capsule (500 mg total) by mouth 2 (two) times daily. (Patient not taking: Reported on 10/01/2023), Disp: 12 capsule, Rfl: 0   feeding supplement (ENSURE ENLIVE / ENSURE PLUS) LIQD, Take 237 mLs by mouth 2 (two) times daily between meals., Disp: 237 mL, Rfl: 12   gabapentin  (NEURONTIN ) 300 MG capsule, Take 1 capsule (300 mg total) by mouth daily in the afternoon. (Patient not taking: Reported on 07/04/2023), Disp: 90 capsule, Rfl: 0   lidocaine  (LIDODERM ) 5 %, Place 1 patch onto the skin at bedtime. Remove & Discard patch within 12 hours or as directed by MD (Patient not taking: Reported on 09/05/2023), Disp: 30 patch, Rfl: 0   tiZANidine (ZANAFLEX) 4 MG capsule, Take 4 mg by mouth 3 (three) times daily as needed for muscle spasms. (Patient not taking: Reported on 09/05/2023), Disp: , Rfl:   Allergies: Allergies  Allergen Reactions   Codeine Nausea And Vomiting    Makes me deathly sick    Jeraline Moment, NP

## 2023-10-04 DIAGNOSIS — L8990 Pressure ulcer of unspecified site, unspecified stage: Secondary | ICD-10-CM | POA: Insufficient documentation

## 2023-10-04 LAB — CBG MONITORING, ED
Glucose-Capillary: 86 mg/dL (ref 70–99)
Glucose-Capillary: 192 mg/dL — ABNORMAL HIGH (ref 70–99)
Glucose-Capillary: 112 mg/dL — ABNORMAL HIGH (ref 70–99)
Glucose-Capillary: 96 mg/dL (ref 70–99)

## 2023-10-04 NOTE — ED Notes (Signed)
 Pts son called for an update.

## 2023-10-04 NOTE — Consult Note (Signed)
 [facility] Psychiatric Consult Follow-up  Patient Name: .[name]  MRN: [id number]  DOB: 03/26/1941  Consult Order details:  Orders (From admission, onward)     Start     Ordered   10/01/23 1505  CONSULT TO CALL ACT TEAM       Ordering Provider: Malvina Alm DASEN, MD  Provider:  (Not yet assigned)  Question:  Reason for Consult?  Answer:  psych consult   10/01/23 1504   10/01/23 0349  IP CONSULT TO PSYCHIATRY       Ordering Provider: Neomi Josette SAILOR, DO  Provider:  (Not yet assigned)  Question Answer Comment  Place call to: was awaiting psych dispo, hasnt been a psych note in 3 days   Reason for Consult Admit      10/01/23 0348   09/27/23 1745  CONSULT TO CALL ACT TEAM       Ordering Provider: Dorothyann Drivers, MD  Provider:  (Not yet assigned)  Question:  Reason for Consult?  Answer:  Psych consult   09/27/23 1745   09/27/23 1745  IP CONSULT TO PSYCHIATRY       Ordering Provider: Dorothyann Drivers, MD  Provider:  (Not yet assigned)  Question Answer Comment  Consult Timeframe ROUTINE - requires response within 24 hours   Reason for Consult? Consult for medication management   Contact phone number where the requesting provider can be reached [phone]      09/27/23 1745             Mode of Visit: Tele-visit Virtual Statement:TELE PSYCHIATRY ATTESTATION & CONSENT As the provider for this telehealth consult, I attest that I verified the patient's identity using two separate identifiers, introduced myself to the patient, provided my credentials, disclosed my location, and performed this encounter via a HIPAA-compliant, real-time, face-to-face, two-way, interactive audio and video platform and with the full consent and agreement of the patient (or guardian as applicable.) Patient physical location: [facility] Emergency Department. Telehealth provider physical location: home office in state of [place].   Video start time: 1800 Video end time: 1830    Psychiatry Consult Evaluation   Service Date: October 04, 2023 LOS:  LOS: 0 days  Chief Complaint: what did you say?  Primary Psychiatric Diagnoses  Dementia   Assessment  [name] is a 83 y.o. female admitted: Presented to the EDfor 09/27/2023  5:02 PM brought into the emergency department by [place] [employer] under IVC taken out by family for paranoia and decreased oral intake. She carries the psychiatric diagnoses of depression and has a past medical history of dementia, HTN, CAD, CHF, NSTEMI, DM2, hyperparathyroidism, CKD, anemia and hyperlipidemia.   Her current presentation of disorientation, confusion, withdrawn behavior, episodic behavioral disturbances is most consistent with her dx for dementia.   This patient was initially admitted on 6/14, initially awaiting collateral from her son and acceptance for psychiatric admission.  Patient was re-evaluated by psychiatry on 6/20 and again recommended for psychiatric admission for dementia and depression.   Behavioral Health did not have an appropriate bed and recommended she be faxed out to other facilities.  Patient has been faxed out to 10 facilities and has not been accepted; her dx of dementia has been a barrier to her placement.  In interacting with her today, she appears pleasant and offers appropriate salutation when greeted. However she's disoriented, not engaged, withdrawn and seems disinterested in talking with me today. She's able to state her name, and aware the current month is June, otherwise  she continues to guess at today's date.   Patient was medically cleared at the time of psych assessment.   Medical Decision Making: It is felt that patient may need higher level of care due to worsening aggression, depression, isolation, and dementia. Patient behaviors are consistent with a person with dementia and other memory loss.  Based on Transition of care notes, per patient's son who is the point of contact, there are financial barriers to placement in memory care unit,   However, patient's son does not have access to her financial statements.  Patient's son does agree to care for her in her home, which is a feasible option to ensure she remains safe in her own home.     Based on above, patient is not recommended for psychiatric inpatient admission. Patient is psychiatrically cleared.   Diagnoses:  Active Hospital problems: Principal Problem:   Dementia (HCC)    Plan   ## Psychiatric Medication Recommendations:  No additional medication changes recommended today.  Continue celexa  20mg  po daily for mood  ## Medical Decision Making Capacity: Not specifically addressed in this encounter Patient is dx with dementia and unable to care for herself or make medical decision; she does not have a power of attorney in place.  The hospital staff has been communicating with patient's son, Kaidan Spengler in tx planning.   ## Further Work-up:  -- most recent EKG on 09/09/2023 had QtC of 541 -- Pertinent labwork reviewed earlier this admission includes: CBC, CMP and U/A   ## Disposition:-- There are no psychiatric contraindications to discharge at this time.  Asking for ED Provider assistance in rescinding her IVC.   Patient will need a memory care unit after hospitalization for safety or can transition back to her home with her son who agrees to live with her for safety concerns.    ## Behavioral / Environmental: -No specific recommendations   ## Safety and Observation Level:  - Based on my clinical evaluation, I estimate the patient to be at low risk of self harm in the current setting. - At this time, we recommend  routine. This decision is based on my review of the chart including patient's history and current presentation, interview of the patient, mental status examination, and consideration of suicide risk including evaluating suicidal ideation, plan, intent, suicidal or self-harm behaviors, risk factors, and protective factors. This judgment is based on our  ability to directly address suicide risk, implement suicide prevention strategies, and develop a safety plan while the patient is in the clinical setting. Please contact our team if there is a concern that risk level has changed.  CSSR Risk Category:C-SSRS RISK CATEGORY: No Risk  Suicide Risk Assessment: Patient has following modifiable risk factors for suicide: none, which we are addressing by recommending placement in a memory care unit. Patient has following non-modifiable or demographic risk factors for suicide: none Patient has the following protective factors against suicide: Supportive family  Thank you for this consult request. Recommendations have been communicated to the primary team.  We will continue to follow at this time.   Bernadette FORBES Barefoot, NP       History of Present Illness  Relevant Aspects of Hospital ED Course:  Admitted on 09/27/2023 brought into the emergency department by [place] [employer] under IVC taken out by family for paranoia and decreased oral intake. She carries the psychiatric diagnoses of depression and has a past medical history of dementia, HTN, CAD, CHF, NSTEMI, DM2, hyperparathyroidism, CKD, anemia and hyperlipidemia.  Patient Report:   Michaelann R Miltner, is seen via telepsych cart for assessment.  Patient observed laying in bed, appropriately groomed and dressed.  Patient greeted and given anticipatory guidance.  She states, hello, I'm doing okay.  She states her name and aware the current month is June, but is otherwise disoriented.  She appears withdrawn and is inattentive throughout.  She's observed staring off into space and then closing her eyes intermittently.  Several food trays observed on her bedside table.  When patient asked about her eating, she does not respond and appears asleep.  Several verbal attempts to wake her but she does not respond.  The paramedic caring for her is at the bedside and provides collateral.  He reports patient did not eat lunch or  dinner; states she drank 2 ensures and fluids only.    This writer is unable to complete safety screening with patient.   Psych ROS:  Depression: +pmhx  Anxiety:  +pmhx  Mania (lifetime and current): unknown Psychosis: (lifetime and current): unknown  Review of Systems  Musculoskeletal:  Positive for falls.  Neurological:  Positive for weakness.  Psychiatric/Behavioral:  Positive for depression and memory loss.   All other systems reviewed and are negative.    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 rate 16, height 5' 3 (1.6 m), weight 68 kg, SpO2 96%. Body mass index is 26.56 kg/m.  Physical Exam Vitals and nursing note reviewed.   Eyes:     Pupils: Pupils are equal, round, and reactive to light.    Musculoskeletal:        General: Normal range of motion.   Skin:    General: Skin is dry.   Neurological:      Mental Status: She is alert. She is disoriented.     Gait: Gait abnormal.     Comments: Uses a walker  Psychiatric:        Attention and Perception: She is inattentive.        Mood and Affect: Affect is blunt.        Speech: She is noncommunicative.        Behavior: Behavior is cooperative.        Thought Content: Thought content normal.        Cognition and Memory: Cognition is impaired. Memory is impaired.        Judgment: Judgment is impulsive.     Mental Status Exam: General Appearance: Casual  Orientation:  Other:  patient states her name and the month of June  Memory:  Recent;   Poor  Concentration:  UTA  Recall:  UTA  Attention  Poor  Eye Contact:  Minimal  Speech:  UTA  Language:  Fair  Volume:  Normal  Mood: Aloof  Affect:  Appropriate, congruent  Thought Process:  UTA  Thought Content:  UTA  Suicidal Thoughts:  UTA  Homicidal Thoughts:  UTA  Judgement:  Poor  Insight:  Lacking  Psychomotor Activity:  Normal  Akathisia:  No  Fund of Knowledge:  Poor      Assets:  Housing Social Support  Cognition:  Impaired  ADL's:  Intact nursing staff assists with ADLS  AIMS (if indicated):        Other History   These have been pulled in through the EMR, reviewed, and updated if appropriate.  Family History:  The patient's family history includes Heart disease in her sister.  Medical History: Past Medical History:  Diagnosis Date   Alzheimer dementia (HCC)    unknown stage   Arthritis    hands, feet, knees, shoulders (05/05/2014)   Bipolar disorder (HCC)    Cataracts, bilateral    immature   Chronic kidney disease    dr says they aren't functioning like they should (05/05/2014)   Coronary artery disease    Depression    takes Zoloft  daily   History of bronchitis 45yrs ago   Hyperlipidemia    takes Atorvastatin  daily   Hypertension    takes Metoprolol ,Lisinopril ,and Apresoline  daily   Joint pain    Peripheral edema    takes Chlothalidone daily    Squamous cell cancer of skin of nose 2012   right   Squamous cell cancer of skin of nose    Type II diabetes mellitus (HCC)    takes Tradjenta  daily    Surgical History: Past Surgical History:  Procedure Laterality Date   COLONOSCOPY WITH PROPOFOL  N/A 02/03/2018   Procedure: COLONOSCOPY WITH PROPOFOL ;  Surgeon: Gaylyn Gladis PENNER, MD;  Location[facility] ENDOSCOPY;  Service: Endoscopy;  Laterality: N/A;   CORONARY ARTERY BYPASS GRAFT  05/12/2007   CABG X1; at [facility]   coronary artery bypass with arterial grafts     DILATION AND CURETTAGE OF UTERUS  2005   FRACTURE SURGERY     I & D EXTREMITY Right 04/30/2014   Procedure: IRRIGATION AND DEBRIDEMENT EXTREMITY;  Surgeon: Norleen Armor, MD;  Location: [facility] OR;  Service: Orthopedics;  Laterality: Right;   INCISION AND DRAINAGE Right 08/11/2014   Procedure: REMOVAL OF DEEP IMPLANTS OF RIGHT TIBIAL FIBIAL INCISION AND DRAINAGE OF RIGHT ANKLE WOUND;  Surgeon: Norleen Armor, MD;  Location: [facility] OR;  Service: Orthopedics;  Laterality: Right;   ORIF ANKLE FRACTURE Right 04/30/2014   Procedure: OPEN REDUCTION INTERNAL FIXATION (ORIF) ANKLE FRACTURE;  Surgeon: Norleen Armor, MD;  Location: [facility] OR;  Service: Orthopedics;  Laterality: Right;   REMOVAL OF IMPLANT Right 08/11/2014   Procedure: REMOVAL OF DEEP IMPLANTS OF RIGHT TIBIAL FIBIAL INCISION AND DRAINAGE OF RIGHT ANKLE WOUND;  Surgeon: Norleen Armor, MD;  Location: [facility] OR;  Service: Orthopedics;  Laterality: Right;   SQUAMOUS CELL CARCINOMA EXCISION Right 2012   nose   VAGINAL HYSTERECTOMY  2007     Medications:   Current Facility-Administered Medications:    amLODipine  (NORVASC ) tablet 10 mg, 10 mg, Oral, Daily, Paduchowski, Kevin, MD, 10 mg at 10/04/23 9040   ascorbic acid  (VITAMIN C ) tablet 500 mg, 500 mg, Oral, Daily, Paduchowski, Kevin, MD, 500 mg at 10/04/23 0948   aspirin  EC tablet  81 mg, 81 mg, Oral, Daily, Dorothyann Drivers, MD, 81 mg at 10/04/23 0948   cefUROXime  (CEFTIN ) tablet 250 mg, 250 mg,  Oral, Q0600, Sung, Jade J, MD, 250 mg at 10/04/23 9278   cholecalciferol  (VITAMIN D3) 25 MCG (1000 UNIT) tablet 1,000 Units, 1,000 Units, Oral, Daily, Dorothyann Drivers, MD, 1,000 Units at 10/04/23 0947   [COMPLETED] citalopram  (CELEXA ) tablet 10 mg, 10 mg, Oral, Daily, 10 mg at 10/03/23 0938 **FOLLOWED BY** citalopram  (CELEXA ) tablet 20 mg, 20 mg, Oral, Daily, Camellia, Janette C, MD, 20 mg at 10/04/23 0949   feeding supplement (ENSURE ENLIVE / ENSURE PLUS) liquid 237 mL, 237 mL, Oral, BID BM, Paduchowski, Kevin, MD, 237 mL at 10/04/23 1501   insulin  aspart (novoLOG ) injection 0-6 Units, 0-6 Units, Subcutaneous, TID WC, Ray, Neha, MD, 1 Units at 10/04/23 1136   LORazepam  (ATIVAN ) tablet 1 mg, 1 mg, Oral, Once, Jacolyn Pae, MD   pioglitazone  (ACTOS ) tablet 15 mg, 15 mg, Oral, q morning, Paduchowski, Kevin, MD, 15 mg at 10/04/23 9050   rosuvastatin  (CRESTOR ) tablet 40 mg, 40 mg, Oral, Daily, Paduchowski, Kevin, MD, 40 mg at 10/04/23 1713  Current Outpatient Medications:    amLODipine  (NORVASC ) 10 MG tablet, TAKE 1 TABLET(10 MG) BY MOUTH DAILY, Disp: 90 tablet, Rfl: 1   Ascorbic Acid  (VITAMIN C  PO), Take 1 tablet by mouth daily., Disp: , Rfl:    aspirin  EC 81 MG tablet, Take 81 mg by mouth daily. Swallow whole., Disp: , Rfl:    Cholecalciferol  (VITAMIN D3) 5000 units TABS, Take 1 tablet by mouth daily., Disp: , Rfl:    furosemide  (LASIX ) 20 MG tablet, TAKE 1 TABLET(20 MG) BY MOUTH DAILY AS NEEDED, Disp: 90 tablet, Rfl: 0   pioglitazone  (ACTOS ) 15 MG tablet, TAKE 1 TABLET BY MOUTH EVERY MORNING, Disp: 90 tablet, Rfl: 1   rosuvastatin  (CRESTOR ) 40 MG tablet, TAKE 1 TABLET BY MOUTH EVERY DAY, Disp: 30 tablet, Rfl: 0   sertraline  (ZOLOFT ) 50 MG tablet, TAKE 1 TABLET(50 MG) BY MOUTH EVERY MORNING, Disp: 90 tablet, Rfl: 0   cephALEXin  (KEFLEX ) 500 MG capsule, Take 1 capsule (500 mg total) by mouth 2 (two) times daily. (Patient not taking: Reported on 10/01/2023), Disp: 12 capsule, Rfl: 0   feeding  supplement (ENSURE ENLIVE / ENSURE PLUS) LIQD, Take 237 mLs by mouth 2 (two) times daily between meals., Disp: 237 mL, Rfl: 12   gabapentin  (NEURONTIN ) 300 MG capsule, Take 1 capsule (300 mg total) by mouth daily in the afternoon. (Patient not taking: Reported on 07/04/2023), Disp: 90 capsule, Rfl: 0   lidocaine  (LIDODERM ) 5 %, Place 1 patch onto the skin at bedtime. Remove & Discard patch within 12 hours or as directed by MD (Patient not taking: Reported on 09/05/2023), Disp: 30 patch, Rfl: 0   tiZANidine (ZANAFLEX) 4 MG capsule, Take 4 mg by mouth 3 (three) times daily as needed for muscle spasms. (Patient not taking: Reported on 09/05/2023), Disp: , Rfl:   Allergies: Allergies  Allergen Reactions   Codeine Nausea And Vomiting    Makes me deathly sick    Bernadette FORBES Barefoot, NP

## 2023-10-04 NOTE — ED Notes (Signed)
 Pt Lunch Provided at bedside

## 2023-10-04 NOTE — ED Notes (Signed)
 Pt Breakfast provided at bedside

## 2023-10-04 NOTE — ED Notes (Signed)
 This tech assisted pt up to bedside commode. Pt brief changed. Pt back in bed with fall bundle in place and call light within reach.

## 2023-10-04 NOTE — ED Notes (Signed)
 TTS did cart consult.

## 2023-10-04 NOTE — ED Notes (Signed)
 Pt laying in bed with eyes open. Bed alarm in place

## 2023-10-04 NOTE — ED Notes (Signed)
 Pt laying in bed, pt let me obtain blood sugar.

## 2023-10-04 NOTE — BH Assessment (Signed)
 Referral information for Psychiatric Hospitalization faxed to:    [facility]  [phone]  [facility]  [phone]  [facility]  [phone]  [facility]  [phone]  [facility]  [phone]  [facility]  [phone]  [facility]  [phone]  [facility]  [phone]  [facility]  [phone]  [facility]  [phone]

## 2023-10-04 NOTE — ED Notes (Signed)
Bed/brief changed.

## 2023-10-04 NOTE — ED Notes (Signed)
Fall bundle in place

## 2023-10-05 DIAGNOSIS — N390 Urinary tract infection, site not specified: Secondary | ICD-10-CM | POA: Diagnosis not present

## 2023-10-05 DIAGNOSIS — F600 Paranoid personality disorder: Secondary | ICD-10-CM | POA: Diagnosis not present

## 2023-10-05 DIAGNOSIS — F0390 Unspecified dementia without behavioral disturbance: Secondary | ICD-10-CM | POA: Diagnosis not present

## 2023-10-05 LAB — CBG MONITORING, ED
Glucose-Capillary: 151 mg/dL — ABNORMAL HIGH (ref 70–99)
Glucose-Capillary: 152 mg/dL — ABNORMAL HIGH (ref 70–99)
Glucose-Capillary: 112 mg/dL — ABNORMAL HIGH (ref 70–99)

## 2023-10-05 MED ORDER — CITALOPRAM HYDROBROMIDE 20 MG PO TABS: 20.0000 mg | ORAL_TABLET | Freq: Every day | ORAL | 2 refills | Status: DC

## 2023-10-05 NOTE — ED Notes (Signed)
Pt given breakfast meal

## 2023-10-05 NOTE — ED Notes (Signed)
 Pt changed at this time with help from NT [facility]. Linens, brief and chux pad changed.

## 2023-10-05 NOTE — ED Notes (Signed)
 IVC pending placement

## 2023-10-05 NOTE — ED Notes (Signed)
 Gave pt a cup of water and raised head of the bed.

## 2023-10-05 NOTE — ED Notes (Signed)
 Pt. Refused dinner tray stated  I'm full from drinking water

## 2023-10-05 NOTE — ED Notes (Signed)
 Introduced myself to pt. Pt is CAO to her normal, breathing normally, and normal in color. Pt is sitting upright in the bed, awake, and is in NAD at this time. Pt calm and cooperative. Pt given a warm wash cloth to wash her face. Pt's side table and room cleaned. Pt's brief  and bedding dry. Pt denies any needs at the time.

## 2023-10-05 NOTE — ED Provider Notes (Signed)
-----------------------------------------   3:46 PM on 10/05/2023 -----------------------------------------   Blood pressure (!) 166/63, pulse 60, temperature 98.3 F (36.8 C), temperature source Oral, resp. rate 18, height 5' 3 (1.6 m), weight 68 kg, SpO2 98%.  The patient is calm and cooperative at this time.  Psychiatry evaluated patient yesterday and had recommended for ascension of IVC as she was not meeting inpatient criteria from a psychiatric perspective at this time.  It is reported that son will pick up patient and take her back home.  IVC order was rescinded and waiting for son to come pick up patient.   Malvina Alm DASEN, MD 10/05/23 [phone]

## 2023-10-05 NOTE — ED Notes (Signed)
 All belongings returned to patient, personal clothes worn home and son received dc instructions, assisted patient to car in wheelchair, pt. Able to ambulate and get into vehicle with minimal assistance

## 2023-10-05 NOTE — ED Notes (Signed)
 VOL, papers rescinded by Dr Malvina, pt pending D/C

## 2023-10-05 NOTE — ED Provider Notes (Signed)
 Emergency Medicine Observation Re-evaluation Note   BP (!) 139/57   Pulse 74   Temp 98.9 F (37.2 C) (Oral)   Resp 20   Ht 5' 3 (1.6 m)   Wt 68 kg   SpO2 100%   BMI 26.56 kg/m    ED Course / MDM   No reported events during my shift at the time of this note.   Pt is awaiting dispo from consultants   Ginnie Shams MD    Shams Ginnie, MD 10/05/23 [phone]

## 2023-10-05 NOTE — Discharge Instructions (Signed)
 She has been started on a new medication called Celexa  which I have sent to the pharmacy for you to pick up and give her this as prescribed.

## 2023-10-05 NOTE — ED Notes (Signed)
 Spoke with son on the phone who states he is willing to come get his mother and will take her home and live with her, He states  I will take care of her to the best of my ability and if it gets to bad I will bring her back or try to find a suitable facility for her, advised to call before he leaves to come get her so ER staff can have patient ready to go home

## 2023-10-12 ENCOUNTER — Other Ambulatory Visit: Payer: Self-pay | Admitting: Internal Medicine

## 2023-10-12 DIAGNOSIS — E782 Mixed hyperlipidemia: Secondary | ICD-10-CM

## 2023-10-30 NOTE — Progress Notes (Signed)
   10/30/2023  Patient ID: [name], female   DOB: 1940/10/25, 83 y.o.   MRN: [id number]  Pharmacy Quality Measure Review  This patient is appearing on a report for being at risk of failing the adherence measure for hypertension (ACEi/ARB) medications this calendar year.   Medication: Lisinopril  Last fill date: 06/06/23 for 90 day supply  Medication discontinued. No further action needed at this time.  Jon VEAR Lindau, PharmD Clinical Pharmacist [phone]

## 2023-11-16 ENCOUNTER — Other Ambulatory Visit: Payer: Self-pay | Admitting: Internal Medicine

## 2023-11-16 DIAGNOSIS — I10 Essential (primary) hypertension: Secondary | ICD-10-CM

## 2023-11-28 ENCOUNTER — Other Ambulatory Visit: Payer: Self-pay | Admitting: Internal Medicine

## 2023-11-28 DIAGNOSIS — E782 Mixed hyperlipidemia: Secondary | ICD-10-CM

## 2023-11-30 ENCOUNTER — Emergency Department

## 2023-11-30 ENCOUNTER — Other Ambulatory Visit: Payer: Self-pay

## 2023-11-30 ENCOUNTER — Inpatient Hospital Stay
Admission: EM | Admit: 2023-11-30 | Discharge: 2023-12-05 | DRG: 689 | Disposition: A | Discharge status: Skilled Nursing Facility | Attending: Internal Medicine | Admitting: Internal Medicine

## 2023-11-30 ENCOUNTER — Encounter: Payer: Self-pay | Admitting: Internal Medicine

## 2023-11-30 DIAGNOSIS — E1140 Type 2 diabetes mellitus with diabetic neuropathy, unspecified: Secondary | ICD-10-CM | POA: Diagnosis present

## 2023-11-30 DIAGNOSIS — Z951 Presence of aortocoronary bypass graft: Secondary | ICD-10-CM | POA: Diagnosis not present

## 2023-11-30 DIAGNOSIS — M6281 Muscle weakness (generalized): Secondary | ICD-10-CM | POA: Diagnosis not present

## 2023-11-30 DIAGNOSIS — R600 Localized edema: Secondary | ICD-10-CM | POA: Diagnosis present

## 2023-11-30 DIAGNOSIS — I878 Other specified disorders of veins: Secondary | ICD-10-CM | POA: Diagnosis present

## 2023-11-30 DIAGNOSIS — E8770 Fluid overload, unspecified: Secondary | ICD-10-CM

## 2023-11-30 DIAGNOSIS — F0283 Dementia in other diseases classified elsewhere, unspecified severity, with mood disturbance: Secondary | ICD-10-CM | POA: Diagnosis present

## 2023-11-30 DIAGNOSIS — R262 Difficulty in walking, not elsewhere classified: Secondary | ICD-10-CM | POA: Diagnosis not present

## 2023-11-30 DIAGNOSIS — E785 Hyperlipidemia, unspecified: Secondary | ICD-10-CM | POA: Diagnosis present

## 2023-11-30 DIAGNOSIS — F0282 Dementia in other diseases classified elsewhere, unspecified severity, with psychotic disturbance: Secondary | ICD-10-CM | POA: Diagnosis present

## 2023-11-30 DIAGNOSIS — F319 Bipolar disorder, unspecified: Secondary | ICD-10-CM | POA: Diagnosis present

## 2023-11-30 DIAGNOSIS — N3000 Acute cystitis without hematuria: Secondary | ICD-10-CM | POA: Diagnosis not present

## 2023-11-30 DIAGNOSIS — E1122 Type 2 diabetes mellitus with diabetic chronic kidney disease: Secondary | ICD-10-CM | POA: Diagnosis present

## 2023-11-30 DIAGNOSIS — I672 Cerebral atherosclerosis: Secondary | ICD-10-CM | POA: Diagnosis not present

## 2023-11-30 DIAGNOSIS — Z79899 Other long term (current) drug therapy: Secondary | ICD-10-CM | POA: Diagnosis not present

## 2023-11-30 DIAGNOSIS — R32 Unspecified urinary incontinence: Secondary | ICD-10-CM | POA: Diagnosis present

## 2023-11-30 DIAGNOSIS — F0390 Unspecified dementia without behavioral disturbance: Secondary | ICD-10-CM | POA: Diagnosis present

## 2023-11-30 DIAGNOSIS — I5032 Chronic diastolic (congestive) heart failure: Secondary | ICD-10-CM | POA: Diagnosis present

## 2023-11-30 DIAGNOSIS — I130 Hypertensive heart and chronic kidney disease with heart failure and stage 1 through stage 4 chronic kidney disease, or unspecified chronic kidney disease: Secondary | ICD-10-CM | POA: Diagnosis present

## 2023-11-30 DIAGNOSIS — N1832 Chronic kidney disease, stage 3b: Secondary | ICD-10-CM | POA: Diagnosis present

## 2023-11-30 DIAGNOSIS — J811 Chronic pulmonary edema: Secondary | ICD-10-CM | POA: Diagnosis not present

## 2023-11-30 DIAGNOSIS — I872 Venous insufficiency (chronic) (peripheral): Secondary | ICD-10-CM | POA: Diagnosis present

## 2023-11-30 DIAGNOSIS — G9341 Metabolic encephalopathy: Secondary | ICD-10-CM | POA: Diagnosis present

## 2023-11-30 DIAGNOSIS — Z743 Need for continuous supervision: Secondary | ICD-10-CM | POA: Diagnosis not present

## 2023-11-30 DIAGNOSIS — I517 Cardiomegaly: Secondary | ICD-10-CM | POA: Diagnosis not present

## 2023-11-30 DIAGNOSIS — I2510 Atherosclerotic heart disease of native coronary artery without angina pectoris: Secondary | ICD-10-CM | POA: Diagnosis present

## 2023-11-30 DIAGNOSIS — R410 Disorientation, unspecified: Principal | ICD-10-CM

## 2023-11-30 DIAGNOSIS — N390 Urinary tract infection, site not specified: Secondary | ICD-10-CM | POA: Diagnosis present

## 2023-11-30 DIAGNOSIS — Z90710 Acquired absence of both cervix and uterus: Secondary | ICD-10-CM | POA: Diagnosis not present

## 2023-11-30 DIAGNOSIS — N183 Chronic kidney disease, stage 3 unspecified: Secondary | ICD-10-CM | POA: Diagnosis present

## 2023-11-30 DIAGNOSIS — G309 Alzheimer's disease, unspecified: Secondary | ICD-10-CM | POA: Diagnosis present

## 2023-11-30 DIAGNOSIS — R531 Weakness: Secondary | ICD-10-CM | POA: Diagnosis not present

## 2023-11-30 DIAGNOSIS — I10 Essential (primary) hypertension: Secondary | ICD-10-CM | POA: Diagnosis present

## 2023-11-30 DIAGNOSIS — Z8249 Family history of ischemic heart disease and other diseases of the circulatory system: Secondary | ICD-10-CM

## 2023-11-30 DIAGNOSIS — R4182 Altered mental status, unspecified: Secondary | ICD-10-CM | POA: Diagnosis not present

## 2023-11-30 DIAGNOSIS — E1129 Type 2 diabetes mellitus with other diabetic kidney complication: Secondary | ICD-10-CM | POA: Diagnosis not present

## 2023-11-30 DIAGNOSIS — R2689 Other abnormalities of gait and mobility: Secondary | ICD-10-CM | POA: Diagnosis not present

## 2023-11-30 DIAGNOSIS — R488 Other symbolic dysfunctions: Secondary | ICD-10-CM | POA: Diagnosis not present

## 2023-11-30 DIAGNOSIS — B9620 Unspecified Escherichia coli [E. coli] as the cause of diseases classified elsewhere: Secondary | ICD-10-CM | POA: Diagnosis present

## 2023-11-30 DIAGNOSIS — R41841 Cognitive communication deficit: Secondary | ICD-10-CM | POA: Diagnosis not present

## 2023-11-30 DIAGNOSIS — R2243 Localized swelling, mass and lump, lower limb, bilateral: Secondary | ICD-10-CM | POA: Diagnosis not present

## 2023-11-30 DIAGNOSIS — Z66 Do not resuscitate: Secondary | ICD-10-CM | POA: Diagnosis present

## 2023-11-30 DIAGNOSIS — E1169 Type 2 diabetes mellitus with other specified complication: Secondary | ICD-10-CM

## 2023-11-30 DIAGNOSIS — Z7982 Long term (current) use of aspirin: Secondary | ICD-10-CM

## 2023-11-30 DIAGNOSIS — Z85828 Personal history of other malignant neoplasm of skin: Secondary | ICD-10-CM | POA: Diagnosis not present

## 2023-11-30 DIAGNOSIS — I959 Hypotension, unspecified: Secondary | ICD-10-CM | POA: Diagnosis not present

## 2023-11-30 LAB — URINALYSIS, W/ REFLEX TO CULTURE (INFECTION SUSPECTED)
Bilirubin Urine: NEGATIVE
Glucose, UA: NEGATIVE mg/dL
Hgb urine dipstick: NEGATIVE
Ketones, ur: NEGATIVE mg/dL
Nitrite: POSITIVE — AB
Protein, ur: NEGATIVE mg/dL
Specific Gravity, Urine: 1.009 (ref 1.005–1.030)
WBC, UA: >50 WBC/hpf (ref 0–5)
pH: 6 (ref 5.0–8.0)

## 2023-11-30 LAB — CBC WITH DIFFERENTIAL/PLATELET
Abs Immature Granulocytes: 0.02 K/uL (ref 0.00–0.07)
Basophils Absolute: 0 K/uL (ref 0.0–0.1)
Basophils Relative: 1 %
Eosinophils Absolute: 0.1 K/uL (ref 0.0–0.5)
Eosinophils Relative: 2 %
HCT: 31.9 % — ABNORMAL LOW (ref 36.0–46.0)
Hemoglobin: 10.1 g/dL — ABNORMAL LOW (ref 12.0–15.0)
Immature Granulocytes: 0 %
Lymphocytes Relative: 34 %
Lymphs Abs: 2.2 K/uL (ref 0.7–4.0)
MCH: 27.3 pg (ref 26.0–34.0)
MCHC: 31.7 g/dL (ref 30.0–36.0)
MCV: 86.2 fL (ref 80.0–100.0)
Monocytes Absolute: 0.6 K/uL (ref 0.1–1.0)
Monocytes Relative: 9 %
Neutro Abs: 3.6 K/uL (ref 1.7–7.7)
Neutrophils Relative %: 54 %
Platelets: 200 K/uL (ref 150–400)
RBC: 3.7 MIL/uL — ABNORMAL LOW (ref 3.87–5.11)
RDW: 15.2 % (ref 11.5–15.5)
WBC: 6.5 K/uL (ref 4.0–10.5)
nRBC: 0 % (ref 0.0–0.2)

## 2023-11-30 LAB — BASIC METABOLIC PANEL WITH GFR
Anion gap: 14 (ref 5–15)
BUN: 30 mg/dL — ABNORMAL HIGH (ref 8–23)
CO2: 23 mmol/L (ref 22–32)
Calcium: 10.9 mg/dL — ABNORMAL HIGH (ref 8.9–10.3)
Chloride: 104 mmol/L (ref 98–111)
Creatinine, Ser: 1.62 mg/dL — ABNORMAL HIGH (ref 0.44–1.00)
GFR, Estimated: 31 mL/min — ABNORMAL LOW (ref >60)
Glucose, Bld: 72 mg/dL (ref 70–99)
Potassium: 3.3 mmol/L — ABNORMAL LOW (ref 3.5–5.1)
Sodium: 141 mmol/L (ref 135–145)

## 2023-11-30 LAB — LACTIC ACID, PLASMA: Lactic Acid, Venous: 0.9 mmol/L (ref 0.5–1.9)

## 2023-11-30 LAB — BRAIN NATRIURETIC PEPTIDE: B Natriuretic Peptide: 126.3 pg/mL — ABNORMAL HIGH (ref 0.0–100.0)

## 2023-11-30 MED ORDER — BISACODYL 5 MG PO TBEC: 5.0000 mg | DELAYED_RELEASE_TABLET | Freq: Every day | ORAL | Status: DC | PRN

## 2023-11-30 MED ORDER — DOCUSATE SODIUM 100 MG PO CAPS
100.0000 mg | ORAL_CAPSULE | Freq: Two times a day (BID) | ORAL | Status: DC
  Administered 2023-11-30 – 2023-12-05 (×10): 100 mg via ORAL
  Filled 2023-11-30 (×10): qty 1

## 2023-11-30 MED ORDER — ACETAMINOPHEN 650 MG RE SUPP: 650.0000 mg | Freq: Four times a day (QID) | RECTAL | Status: DC | PRN

## 2023-11-30 MED ORDER — ROSUVASTATIN CALCIUM 20 MG PO TABS
40.0000 mg | ORAL_TABLET | Freq: Every evening | ORAL | Status: DC
  Administered 2023-11-30 – 2023-12-04 (×4): 40 mg via ORAL
  Filled 2023-11-30 (×6): qty 2

## 2023-11-30 MED ORDER — ENOXAPARIN SODIUM 30 MG/0.3ML IJ SOSY
30.0000 mg | PREFILLED_SYRINGE | INTRAMUSCULAR | Status: DC
  Administered 2023-11-30 – 2023-12-04 (×4): 30 mg via SUBCUTANEOUS
  Filled 2023-11-30 (×5): qty 0.3

## 2023-11-30 MED ORDER — POLYETHYLENE GLYCOL 3350 17 G PO PACK
17.0000 g | PACK | Freq: Every day | ORAL | Status: DC | PRN
Start: 2023-11-30 — End: 2023-12-06
  Administered 2023-12-04: 17 g via ORAL
  Filled 2023-11-30: qty 1

## 2023-11-30 MED ORDER — ACETAMINOPHEN 325 MG PO TABS
650.0000 mg | ORAL_TABLET | Freq: Four times a day (QID) | ORAL | Status: DC | PRN
  Administered 2023-12-05: 650 mg via ORAL
  Filled 2023-11-30: qty 2

## 2023-11-30 MED ORDER — HYDRALAZINE HCL 20 MG/ML IJ SOLN: 5.0000 mg | INTRAMUSCULAR | Status: DC | PRN

## 2023-11-30 MED ORDER — ONDANSETRON HCL 4 MG/2ML IJ SOLN
4.0000 mg | Freq: Four times a day (QID) | INTRAMUSCULAR | Status: DC | PRN
Start: 2023-11-30 — End: 2023-12-06

## 2023-11-30 MED ORDER — ASPIRIN 81 MG PO TBEC
81.0000 mg | DELAYED_RELEASE_TABLET | Freq: Every day | ORAL | Status: DC
  Administered 2023-12-01 – 2023-12-05 (×5): 81 mg via ORAL
  Filled 2023-11-30 (×5): qty 1

## 2023-11-30 MED ORDER — FUROSEMIDE 10 MG/ML IJ SOLN: 40.0000 mg | Freq: Once | INTRAMUSCULAR | Status: DC

## 2023-11-30 MED ORDER — AMLODIPINE BESYLATE 10 MG PO TABS
10.0000 mg | ORAL_TABLET | Freq: Every day | ORAL | Status: DC
  Administered 2023-12-01 – 2023-12-05 (×5): 10 mg via ORAL
  Filled 2023-11-30 (×5): qty 1

## 2023-11-30 MED ORDER — GABAPENTIN 300 MG PO CAPS
300.0000 mg | ORAL_CAPSULE | Freq: Every day | ORAL | Status: DC
  Administered 2023-12-01 – 2023-12-05 (×5): 300 mg via ORAL
  Filled 2023-11-30 (×5): qty 1

## 2023-11-30 MED ORDER — ONDANSETRON HCL 4 MG PO TABS: 4.0000 mg | ORAL_TABLET | Freq: Four times a day (QID) | ORAL | Status: DC | PRN

## 2023-11-30 MED ORDER — FUROSEMIDE 10 MG/ML IJ SOLN
20.0000 mg | Freq: Once | INTRAMUSCULAR | Status: AC
  Administered 2023-11-30: 20 mg via INTRAVENOUS
  Filled 2023-11-30: qty 4

## 2023-11-30 MED ORDER — POTASSIUM CHLORIDE 20 MEQ PO PACK
40.0000 meq | PACK | ORAL | Status: AC
  Administered 2023-11-30: 40 meq via ORAL
  Filled 2023-11-30: qty 2

## 2023-11-30 MED ORDER — SODIUM CHLORIDE 0.9 % IV SOLN
1.0000 g | INTRAVENOUS | Status: AC
  Administered 2023-11-30: 1 g via INTRAVENOUS
  Filled 2023-11-30: qty 10

## 2023-11-30 MED ORDER — CITALOPRAM HYDROBROMIDE 20 MG PO TABS
20.0000 mg | ORAL_TABLET | Freq: Every day | ORAL | Status: DC
  Administered 2023-12-01: 20 mg via ORAL
  Filled 2023-11-30: qty 1

## 2023-11-30 NOTE — ED Provider Notes (Signed)
 Clinical Course as of 11/30/23 1847  [place] Nov 30, 2023  1546 Signed out to me. Will need admission for acute encephalopathy, possibly from UTI. Unable to ambulate. Hallucinating more than usual. Change in mental status. More incontinence. Edema is severe in both legs. Admit after CT head.  [HD]  1736 CT Head Wo Contrast Unremarkable will call the patient in for admission [HD]  1738 I updated patient's son at bedside.  He is agreeable with the plan as he does not think he can care for the patient at home.  Will admit the patient to the hospital [HD]    Clinical Course User Index [HD] Nicholaus Rolland BRAVO, MD      Nicholaus Rolland BRAVO, MD 11/30/23 [phone]

## 2023-11-30 NOTE — ED Provider Notes (Signed)
 [facility] Provider Note    Event Date/Time   First MD Initiated Contact with Patient 11/30/23 1408     (approximate)   History   Leg Swelling and Urinary Tract Infection  EM caveat: Patient is seems at least mildly confused or cognitively impaired.  Majority of history comes from the patient's son Iretta Mangrum  HPI  [name] is a 83 y.o. female who has a history of dementia, chronic kidney disease type 2 diabetes coronary disease congestive heart failure paranoia  Discussed with the patient's son he reports for the last 2 days the patient has been weak her legs become swollen she has had several episodes of urinary incontinence and he is concerned about potential urinary infection.  She does have some baseline hallucinations where she will talk to his dead brother, but he reports as of recent the last couple days she has been hallucinating more concerning things such as snakes in the home and seems more confused than her normal  She has primarily been in the chair and typically she would use some mobility device to move about the house but the last 2 days she has remained in the chair and he was unable to get her from chair today so called EMS for concerns of increasing fatigue and also increased swelling both legs.  He advises swelling both legs is typical but generally goes away as he elevates her legs but he has not been able to do so since she has been in the chair     Physical Exam   Triage Vital Signs: ED Triage Vitals  Encounter Vitals Group     BP 11/30/23 1348 (!) 126/56     Girls Systolic BP Percentile --      Girls Diastolic BP Percentile --      Boys Systolic BP Percentile --      Boys Diastolic BP Percentile --      Pulse Rate 11/30/23 1345 62     Resp 11/30/23 1345 (!) 27     Temp 11/30/23 1345 98.7 F (37.1 C)     Temp src --      SpO2 11/30/23 1345 100 %     Weight 11/30/23 1342 149 lb 14.6 oz (68 kg)     Height --       Head Circumference --      Peak Flow --      Pain Score --      Pain Loc --      Pain Education --      Exclude from Growth Chart --     Most recent vital signs: Vitals:   11/30/23 1357 11/30/23 1400  BP:  (!) 126/48  Pulse: (!) 59 (!) 52  Resp: 19 19  Temp:    SpO2: 100% 98%     General: Awake, no distress.  She seems confused.  Reports that the year to be 2025 but month of January.  When I first entered the room she was conversing or seemingly conversing into herself with no one else in the room CV:  Good peripheral perfusion.  Normal tones and rate Resp:  Normal effort.  Clear bilateral with normal work of breathing Abd:  No distention.  Soft nontender nondistended Other:  Moderate to severe bilateral lower extremity pitting edema with venous stasis changes but no overt erythema warmth or skin breakdown.  I do not see clear evidence of superinfection or crepitance.  They are equally swollen bilateral  ED Results / Procedures / Treatments   Labs (all labs ordered are listed, but only abnormal results are displayed) Labs Reviewed  BASIC METABOLIC PANEL WITH GFR - Abnormal; Notable for the following components:      Result Value   Potassium 3.3 (*)    BUN 30 (*)    Creatinine, Ser 1.62 (*)    Calcium  10.9 (*)    GFR, Estimated 31 (*)    All other components within normal limits  URINALYSIS, W/ REFLEX TO CULTURE (INFECTION SUSPECTED) - Abnormal; Notable for the following components:   Color, Urine YELLOW (*)    APPearance CLOUDY (*)    Nitrite POSITIVE (*)    Leukocytes,Ua LARGE (*)    Bacteria, UA MANY (*)    All other components within normal limits  CBC WITH DIFFERENTIAL/PLATELET - Abnormal; Notable for the following components:   RBC 3.70 (*)    Hemoglobin 10.1 (*)    HCT 31.9 (*)    All other components within normal limits  BRAIN NATRIURETIC PEPTIDE - Abnormal; Notable for the following components:   B Natriuretic Peptide 126.3 (*)    All other components  within normal limits  URINE CULTURE  LACTIC ACID, PLASMA  LACTIC ACID, PLASMA     EKG  Inter by me at 1630 heart rate 65 QRS 90 QTc 516 Appears to demonstrate type II Myrla elizabeth noted in March of this year by her cardiologist] ECG with occasional pauses, on review suspect Mobitz 1 No acute ischemic abnormalities    RADIOLOGY  DG Chest Portable 1 View Result Date: 11/30/2023 CLINICAL DATA:  Edema. EXAM: PORTABLE CHEST 1 VIEW COMPARISON:  09/27/2023 FINDINGS: Prior median sternotomy. The heart is enlarged. Diffuse pulmonary edema. No focal airspace disease. No pleural fluid or pneumothorax. No acute osseous findings. IMPRESSION: Cardiomegaly with pulmonary edema. Electronically Signed   By: Andrea Gasman M.D.   On: 11/30/2023 15:50    Chest x-ray inter by me as evidence of at least mild volume overload mild edema   PROCEDURES:  Critical Care performed: No  Procedures   MEDICATIONS ORDERED IN ED: Medications  cefTRIAXone  (ROCEPHIN ) 1 g in sodium chloride  0.9 % 100 mL IVPB (1 g Intravenous New Bag/Given 11/30/23 1623)  potassium chloride  (KLOR-CON ) packet 40 mEq (has no administration in time range)  furosemide  (LASIX ) injection 20 mg (has no administration in time range)     IMPRESSION / MDM / ASSESSMENT AND PLAN / ED COURSE  I reviewed the triage vital signs and the nursing notes.                              No report of any trauma or injury.  Has a history of dementia with behavioral disturbance as a seem to be more worse over the last 2 days with increased fatigue, unable to ambulate from chair at home and son reports concerns of incontinence and concerns for potential developing UTI.  Patient denies any pain or discomfort except for swollen lower legs.    Differential diagnosis includes, but is not limited to, seems to have acute on chronic confusion possibly some delusions or hallucinations and also associated edema.  Based on the clinical history I suspect  her edema is likely worsened in part by her inability to get her feet up out of the chair over the last 2 days but also suffered some elements of volume overload and is on Lasix  and chest x-ray today  appears volume overloaded as well as her exam of the peripheral edema in her legs.  Urinalysis consistent with urinary tract infection.  Will initiate Rocephin .  Due to her degree of debility weakness and change in mental status I certainly believe that she would benefit from admission.  I also think that she may benefit from a psychiatric consultation for further behavioral stabilization treatments such as medication changes she is not overtly suicidal homicidal or in need for involuntary commitment at this team by any means.  Suspect her condition is provoked by underlying medical illness on top of background of dementia  Patient's presentation is most consistent with acute complicated illness / injury requiring diagnostic workup.      Clinical Course as of 11/30/23 1625  Sun Nov 30, 2023  1546 Signed out to me. Will need admission for acute encephalopathy, possibly from UTI. Unable to ambulate. Hallucinating more than usual. Change in mental status. More incontinence. Edema is severe in both legs. Admit after CT head.  [HD]    Clinical Course User Index [HD] Nicholaus Rolland BRAVO, MD   Will give small dose of Lasix  for diuresis.  Antibiotics for urinary tract infection.  Plan for admission.  Oncoming physician following up on CT of the head and venous lower extremity ultrasound results.  If CT head shows no acute finding would anticipate admission to the hospitalist service with further workup care and treatment moving forward with internal medicine team.  I did discuss with John her son that I anticipated the patient would likely require admission  FINAL CLINICAL IMPRESSION(S) / ED DIAGNOSES   Final diagnoses:  Delirium  Urinary tract infection, acute  Hypervolemia, unspecified hypervolemia type   Generalized weakness     Rx / DC Orders   ED Discharge Orders     None        Note:  This document was prepared using Dragon voice recognition software and may include unintentional dictation errors.   Dicky Anes, MD 11/30/23 [phone]

## 2023-11-30 NOTE — H&P (Signed)
 History and Physical    Patient: [name] FMW:[id number] DOB: 1940-05-28 DOA: 11/30/2023 DOS: the patient was seen and examined on 11/30/2023 PCP: Albina GORMAN Dine, MD  Patient coming from: Home - lives with son; UTAH: [name], [phone]   Chief Complaint: LE edema  HPI: [name] is a 83 y.o. female with medical history significant of dementia, stage 3b CKD, CAD, and CHF who presented on 8/17 with LE edema and concern for UTI. Patient is now able to explain about edema, denies urinary symptoms.  Also with L upper arm pain from my son was throwing me around all over the place.  I spoke with her son by telephone.  He reports that she is not formally diagnosed with dementia but has been told she does.  She was doing ok but in the last few days she was more confused.  Not getting up, going to restroom, concern for infection.  She has been ignoring him, talking to people who are not there/hallucinating.  The leg swelling is because he can't convince her to go to bed because there are snakes in the bed.  He can't get her to keep her legs elevated and so the edema won't resolve.  She has not complained of her arm hurting, did report head and leg pain.  She would rather die naturally if that were to happen.    ER Course:  Son reports change in MS.  Weak, hallucinating, more than baseline.  UTI, given Ceftriaxone .  +encephalopathy.     Review of Systems: As mentioned in the history of present illness. All other systems reviewed and are negative.  Limited by cognition. Past Medical History:  Diagnosis Date   Alzheimer dementia (HCC)    unknown stage   Arthritis    hands, feet, knees, shoulders (05/05/2014)   Bipolar disorder (HCC)    Cataracts, bilateral    immature   Chronic kidney disease    dr says they aren't functioning like they should (05/05/2014)   Coronary artery disease    Depression    takes Zoloft  daily   History of bronchitis 31yrs ago    Hyperlipidemia    takes Atorvastatin  daily   Hypertension    takes Metoprolol ,Lisinopril ,and Apresoline  daily   Joint pain    Peripheral edema    takes Chlothalidone daily   Squamous cell cancer of skin of nose 2012   right   Squamous cell cancer of skin of nose    Type II diabetes mellitus (HCC)    takes Tradjenta  daily   Past Surgical History:  Procedure Laterality Date   COLONOSCOPY WITH PROPOFOL  N/A 02/03/2018   Procedure: COLONOSCOPY WITH PROPOFOL ;  Surgeon: Gaylyn Gladis PENNER, MD;  Location: [facility] ENDOSCOPY;  Service: Endoscopy;  Laterality: N/A;   CORONARY ARTERY BYPASS GRAFT  05/12/2007   CABG X1; at [facility]   coronary artery bypass with arterial grafts     DILATION AND CURETTAGE OF UTERUS  2005   FRACTURE SURGERY     I & D EXTREMITY Right 04/30/2014   Procedure: IRRIGATION AND DEBRIDEMENT EXTREMITY;  Surgeon: Norleen Armor, MD;  Location: [facility] OR;  Service: Orthopedics;  Laterality: Right;   INCISION AND DRAINAGE Right 08/11/2014   Procedure: REMOVAL OF DEEP IMPLANTS OF RIGHT TIBIAL FIBIAL INCISION AND DRAINAGE OF RIGHT ANKLE WOUND;  Surgeon: Norleen Armor, MD;  Location: [facility] OR;  Service: Orthopedics;  Laterality: Right;   ORIF ANKLE FRACTURE Right 04/30/2014   Procedure: OPEN REDUCTION INTERNAL FIXATION (ORIF) ANKLE  FRACTURE;  Surgeon: Norleen Armor, MD;  Location: [facility] OR;  Service: Orthopedics;  Laterality: Right;   REMOVAL OF IMPLANT Right 08/11/2014   Procedure: REMOVAL OF DEEP IMPLANTS OF RIGHT TIBIAL FIBIAL INCISION AND DRAINAGE OF RIGHT ANKLE WOUND;  Surgeon: Norleen Armor, MD;  Location: [facility] OR;  Service: Orthopedics;  Laterality: Right;   SQUAMOUS CELL CARCINOMA EXCISION Right 2012   nose   VAGINAL HYSTERECTOMY  2007   Social History:  reports that she has never smoked. She has never used smokeless tobacco. She reports that she does not drink alcohol and does not use drugs.  Allergies  Allergen Reactions   Codeine Nausea And Vomiting    Makes me deathly sick    Family  History  Problem Relation Age of Onset   Heart disease Sister     Prior to Admission medications   Medication Sig Start Date End Date Taking? Authorizing Provider  amLODipine  (NORVASC ) 10 MG tablet TAKE 1 TABLET(10 MG) BY MOUTH DAILY 11/17/23   Albina GORMAN Dine, MD  Ascorbic Acid  (VITAMIN C  PO) Take 1 tablet by mouth daily.    [provider]  aspirin  EC 81 MG tablet Take 81 mg by mouth daily. Swallow whole.    [provider]  cephALEXin  (KEFLEX ) 500 MG capsule Take 1 capsule (500 mg total) by mouth 2 (two) times daily. Patient not taking: Reported on 10/01/2023 09/05/23   Dorothyann Drivers, MD  Cholecalciferol  (VITAMIN D3) 5000 units TABS Take 1 tablet by mouth daily.    [provider]  citalopram  (CELEXA ) 20 MG tablet Take 1 tablet (20 mg total) by mouth daily. 10/05/23 01/03/24  Malvina Alm DASEN, MD  feeding supplement (ENSURE ENLIVE / ENSURE PLUS) LIQD Take 237 mLs by mouth 2 (two) times daily between meals. 07/09/23   Amin, Sumayya, MD  furosemide  (LASIX ) 20 MG tablet TAKE 1 TABLET(20 MG) BY MOUTH DAILY AS NEEDED 09/10/23   Albina GORMAN Dine, MD  gabapentin  (NEURONTIN ) 300 MG capsule Take 1 capsule (300 mg total) by mouth daily in the afternoon. Patient not taking: Reported on 07/04/2023 04/11/23 07/10/23  Albina GORMAN Dine, MD  lidocaine  (LIDODERM ) 5 % Place 1 patch onto the skin at bedtime. Remove & Discard patch within 12 hours or as directed by MD Patient not taking: Reported on 09/05/2023 07/09/23   Caleen Qualia, MD  pioglitazone  (ACTOS ) 15 MG tablet TAKE 1 TABLET BY MOUTH EVERY MORNING 09/05/23   Albina GORMAN Dine, MD  rosuvastatin  (CRESTOR ) 40 MG tablet TAKE 1 TABLET BY MOUTH EVERY DAY 11/28/23   Albina GORMAN Dine, MD  sertraline  (ZOLOFT ) 50 MG tablet TAKE 1 TABLET(50 MG) BY MOUTH EVERY MORNING 09/22/23   Albina GORMAN Dine, MD  tiZANidine (ZANAFLEX) 4 MG capsule Take 4 mg by mouth 3 (three) times daily as needed for muscle spasms. Patient not taking: Reported  on 09/05/2023    [provider]    Physical Exam: Vitals:   11/30/23 1357 11/30/23 1400 11/30/23 1630 11/30/23 1700  BP:  (!) 126/48 (!) 112/47 (!) 129/57  Pulse: (!) 59 (!) 52 68 (!) 52  Resp: 19 19 14 15   Temp:      SpO2: 100% 98% 100% 99%  Weight:       General:  Appears calm and comfortable and is in NAD, malodorous Eyes:  normal lids, iris ENT:  grossly normal hearing, lips & tongue, mmm; poor dentition Cardiovascular:  RR with bradycardia. 3+ LE edema.  Respiratory:   CTA bilaterally with no wheezes/rales/rhonchi.  Normal respiratory effort. Abdomen:  soft, NT, ND Skin:  stasis dermatitis without significant lesions of BLE Musculoskeletal:  grossly normal tone BUE/BLE, good ROM, no bony abnormality Psychiatric:  pleasant mood and affect, speech fluent and appropriate, AOx3 Neurologic:  CN 2-12 grossly intact, moves all extremities in coordinated fashion   Radiological Exams on Admission: Independently reviewed - see discussion in A/P where applicable  CT Head Wo Contrast Result Date: 11/30/2023 CLINICAL DATA:  Delirium, lower extremity edema EXAM: CT HEAD WITHOUT CONTRAST TECHNIQUE: Contiguous axial images were obtained from the base of the skull through the vertex without intravenous contrast. RADIATION DOSE REDUCTION: This exam was performed according to the departmental dose-optimization program which includes automated exposure control, adjustment of the mA and/or kV according to patient size and/or use of iterative reconstruction technique. COMPARISON:  09/04/2023 FINDINGS: Brain: Stable scattered hypodensities within the periventricular white matter and basal ganglia, consistent with chronic small vessel ischemic changes. No evidence of acute infarct or hemorrhage. Lateral ventricles and remaining midline structures are unremarkable. No acute extra-axial fluid collections. No mass effect. Vascular: No hyperdense vessel or unexpected calcification. Stable  atherosclerosis. Skull: Normal. Negative for fracture or focal lesion. Sinuses/Orbits: Polypoid mucosal thickening left maxillary sinus. Remaining paranasal sinuses are clear. Other: None. IMPRESSION: 1. Stable head CT, no acute intracranial process. Electronically Signed   By: Ozell Daring M.D.   On: 11/30/2023 17:08   US  Venous Img Lower Bilateral Result Date: 11/30/2023 CLINICAL DATA:  Lower extremity edema. EXAM: BILATERAL LOWER EXTREMITY VENOUS DOPPLER ULTRASOUND TECHNIQUE: Gray-scale sonography with compression, as well as color and duplex ultrasound, were performed to evaluate the deep venous system(s) from the level of the common femoral vein through the popliteal and proximal calf veins. COMPARISON:  None Available. FINDINGS: VENOUS Normal compressibility of the common femoral, superficial femoral, and popliteal veins, as well as the visualized calf veins. Visualized portions of profunda femoral vein and great saphenous vein unremarkable. No filling defects to suggest DVT on grayscale or color Doppler imaging. Doppler waveforms show normal direction of venous flow, normal respiratory plasticity and response to augmentation. OTHER Subcutaneous edema noted in the calves. Limitations: none IMPRESSION: No evidence of DVT in either lower extremity. Electronically Signed   By: Andrea Gasman M.D.   On: 11/30/2023 16:48   DG Chest Portable 1 View Result Date: 11/30/2023 CLINICAL DATA:  Edema. EXAM: PORTABLE CHEST 1 VIEW COMPARISON:  09/27/2023 FINDINGS: Prior median sternotomy. The heart is enlarged. Diffuse pulmonary edema. No focal airspace disease. No pleural fluid or pneumothorax. No acute osseous findings. IMPRESSION: Cardiomegaly with pulmonary edema. Electronically Signed   By: Andrea Gasman M.D.   On: 11/30/2023 15:50    EKG: Independently reviewed.  Sinus bradycardia with rate 56; prolonged QTc 561; low voltage with no evidence of acute ischemia   Labs on Admission: I have personally  reviewed the available labs and imaging studies at the time of the admission.  Pertinent labs:    K+ 3.3 BUN 30/Creatinine 1.62/GFR 31 Lactate 0.9 BNP 126.2 WBC 6.5 Hgb 10.1 UA: large LE, + nitrite, many bacteria   Assessment and Plan: Principal Problem:   UTI (urinary tract infection) Active Problems:   Essential hypertension   Hyperlipidemia   Type II diabetes mellitus with renal manifestations (HCC)   Chronic diastolic CHF (congestive heart failure) (HCC)   CKD (chronic kidney disease) stage 3, GFR 30-59 ml/min (HCC)   Coronary artery disease involving native coronary artery of native heart without angina pectoris   Dementia (HCC)  Bilateral lower extremity edema   Stasis dermatitis of both legs   DNR (do not resuscitate)    UTI Patient presenting without sepsis physiology but with symptoms concerning for UTI She previously had a + urine culture 6/16 with  E. Coli and Proteus, resistant only to Nitrofurantoin Today's UA again appears to demonstrate infection with large LE, positive nitrite, many bacteria Will continue treatment with Rocephin  (received first dose in the ER) Will observe overnight, anticipate discharge to home 8/18 or 8/19 if her mental status is clearing  Acute metabolic encephalopathy superimposed on dementia Patient presenting with encephalopathy as evidenced by her increased confusion and hallucinations While the patient does have underlying dementia, this is a change compared to her usual baseline mental status Her acute metabolic encephalopathy is suspected to be due to a UTI at this time based on abnormal UA; urine culture is pending. Delirium precautions Continue citalopram   LE Edema with statis dermatitis Unremarkable BNP Negative BLE DVT US  Suspect dependent edema based on her son's report Will order Unna boots, as she does have stasis dermatitis  Stage 3b CKD Appears to be stable at this time Attempt to avoid nephrotoxic  medications Recheck BMP in AM   CAD No report of CP Continue ASA, rosuvastatin   HTN Continue amlodipine   DM Prior A1c 5.1 Hold pioglitazone  She does not appear to need ongoing medications for DM at this time, goal A1c <8 based on age Carb modified diet  Continue gabapentin  for neuropathy (although this could contribute to encephalopathy, this is a chronic medication and so this seems less likely to be the cause)  Chronic diastolic CHF 07/05/2023 echo with preserved EF and grade 1 diastolic dysfunction LE edema appears to be from dependent edema rather than decompensated CHF Takes Lasix  prn, will hold for now  DNR I have discussed code status with the family and the patient would not desire resuscitation and would prefer to die a natural death should that situation arise. Patient will need a gold out of facility DNR form at the time of discharge       Advance Care Planning:   Code Status: Limited: Do not attempt resuscitation (DNR) -DNR-LIMITED -Do Not Intubate/DNI    Consults: Psychiatry (placed by ER)  DVT Prophylaxis: Lovenox   Family Communication: None present; I spoke with her son by telephone  Severity of Illness: The appropriate patient status for this patient is OBSERVATION. Observation status is judged to be reasonable and necessary in order to provide the required intensity of service to ensure the patient's safety. The patient's presenting symptoms, physical exam findings, and initial radiographic and laboratory data in the context of their medical condition is felt to place them at decreased risk for further clinical deterioration. Furthermore, it is anticipated that the patient will be medically stable for discharge from the hospital within 2 midnights of admission.   Author: Delon Herald, MD 11/30/2023 6:17 PM  For on call review [email or web address].

## 2023-11-30 NOTE — ED Notes (Signed)
 CCMD contacted to add pt on monitoring

## 2023-11-30 NOTE — ED Triage Notes (Signed)
 BIB EMS from home. Pt son is primary caretaker and states pt has not been elevating legs as she should and has bilateral lower extremeity swelling. Pt is refusing to allow son to assist her to walk to bathroom and has been urinating on self. Pt has a strong urine smell. Pt is demented at baseline.  EMS VS  60 hr 127/82 bp 98% ra 20 RR 93 cbg 98 temp

## 2023-11-30 NOTE — Progress Notes (Signed)
 PHARMACIST - PHYSICIAN COMMUNICATION  CONCERNING:  Enoxaparin  (Lovenox ) for DVT Prophylaxis    RECOMMENDATION: Patient was prescribed enoxaprin 40mg  q24 hours for VTE prophylaxis.   Filed Weights   11/30/23 1342  Weight: 68 kg (149 lb 14.6 oz)    Body mass index is 26.56 kg/m.  Estimated Creatinine Clearance: 24.3 mL/min (A) (by C-G formula based on SCr of 1.62 mg/dL (H)).  Patient is candidate for enoxaparin  30mg  every 24 hours based on CrCl <64ml/min or Weight <45kg  DESCRIPTION: Pharmacy has adjusted enoxaparin  dose per [facility] policy.  Patient is now receiving enoxaparin  30 mg every 24 hours    Damien Napoleon, PharmD Clinical Pharmacist  11/30/2023 6:17 PM

## 2023-11-30 NOTE — ED Notes (Signed)
 Ultrasound at bedside

## 2023-12-01 DIAGNOSIS — N3000 Acute cystitis without hematuria: Secondary | ICD-10-CM | POA: Diagnosis not present

## 2023-12-01 DIAGNOSIS — R410 Disorientation, unspecified: Secondary | ICD-10-CM | POA: Diagnosis not present

## 2023-12-01 LAB — CBC
HCT: 29.4 % — ABNORMAL LOW (ref 36.0–46.0)
Hemoglobin: 9.6 g/dL — ABNORMAL LOW (ref 12.0–15.0)
MCH: 27.4 pg (ref 26.0–34.0)
MCHC: 32.7 g/dL (ref 30.0–36.0)
MCV: 83.8 fL (ref 80.0–100.0)
Platelets: 191 K/uL (ref 150–400)
RBC: 3.51 MIL/uL — ABNORMAL LOW (ref 3.87–5.11)
RDW: 15 % (ref 11.5–15.5)
WBC: 5.9 K/uL (ref 4.0–10.5)
nRBC: 0 % (ref 0.0–0.2)

## 2023-12-01 LAB — BASIC METABOLIC PANEL WITH GFR
Anion gap: 10 (ref 5–15)
BUN: 32 mg/dL — ABNORMAL HIGH (ref 8–23)
CO2: 26 mmol/L (ref 22–32)
Calcium: 10 mg/dL (ref 8.9–10.3)
Chloride: 105 mmol/L (ref 98–111)
Creatinine, Ser: 1.65 mg/dL — ABNORMAL HIGH (ref 0.44–1.00)
GFR, Estimated: 31 mL/min — ABNORMAL LOW (ref >60)
Glucose, Bld: 81 mg/dL (ref 70–99)
Potassium: 3.2 mmol/L — ABNORMAL LOW (ref 3.5–5.1)
Sodium: 141 mmol/L (ref 135–145)

## 2023-12-01 MED ORDER — ARIPIPRAZOLE 5 MG PO TABS
5.0000 mg | ORAL_TABLET | Freq: Every day | ORAL | Status: DC
  Administered 2023-12-01 – 2023-12-04 (×4): 5 mg via ORAL
  Filled 2023-12-01 (×4): qty 1

## 2023-12-01 MED ORDER — POTASSIUM CHLORIDE CRYS ER 20 MEQ PO TBCR
40.0000 meq | EXTENDED_RELEASE_TABLET | Freq: Once | ORAL | Status: AC
  Administered 2023-12-01: 40 meq via ORAL
  Filled 2023-12-01: qty 2

## 2023-12-01 MED ORDER — SODIUM CHLORIDE 0.9 % IV SOLN
1.0000 g | INTRAVENOUS | Status: DC
  Filled 2023-12-01: qty 10

## 2023-12-01 MED ORDER — SODIUM CHLORIDE 0.9 % IV SOLN
1.0000 g | INTRAVENOUS | Status: DC
  Administered 2023-12-01: 1 g via INTRAVENOUS
  Filled 2023-12-01 (×2): qty 10

## 2023-12-01 NOTE — Evaluation (Signed)
 Occupational Therapy Evaluation Patient Details Name: [name] MRN: [id number] DOB: July 29, 1940 Today's Date: 12/01/2023   History of Present Illness   83 y.o. female with medical history significant of dementia, stage 3b CKD, CAD, bipolar, HLD, HTN, depression, T2DM, and CHF who presented on 8/17 with LE edema and concern for UTI.     Clinical Impressions Pt was seen for OT evaluation this date. Prior to hospital admission, pt was living alone and son would check on her for mealtimes and in between as able. Pt lives on main fl or a 2 story home with ramped entrance. Pt presents with deficits in cognition, strength, balance, and safety, affecting safe and optimal ADL completion. Pt currently requires MIN A for bed mobility, STS transfers, and taking a few steps with a RW from EOB to the recliner nearby. Physical assist to position RW to improve safety and prevent posterior LOB. Pt would benefit from skilled OT services to address noted impairments and functional limitations (see below for any additional details) in order to maximize safety and independence while minimizing future risk of falls, injury, and readmission. Anticipate the need for follow up OT services upon acute hospital DC. To optimize pt safety, recommend 24/7 supv/assist or as close to it as possible.     If plan is discharge home, recommend the following:   A little help with walking and/or transfers;A lot of help with bathing/dressing/bathroom;Assistance with cooking/housework;Assist for transportation;Help with stairs or ramp for entrance;Direct supervision/assist for medications management;Supervision due to cognitive status;Direct supervision/assist for financial management     Functional Status Assessment   Patient has had a recent decline in their functional status and demonstrates the ability to make significant improvements in function in a reasonable and predictable amount of time.     Equipment  Recommendations   BSC/3in1     Recommendations for Other Services         Precautions/Restrictions   Precautions Precautions: Fall Restrictions Weight Bearing Restrictions Per Provider Order: No     Mobility Bed Mobility Overal bed mobility: Needs Assistance Bed Mobility: Supine to Sit     Supine to sit: Min assist, HOB elevated, Used rails     General bed mobility comments: significantly increased time, very slow to scoot EOB but ultimately able to complete with only MIN A and VC for sequencing/bed rail use    Transfers Overall transfer level: Needs assistance Equipment used: Rolling walker (2 wheels) Transfers: Sit to/from Stand Sit to Stand: Min assist           General transfer comment: MIN A with VC for hand placement (preference to pull up on middle of the RW) x2 attempts      Balance Overall balance assessment: Needs assistance Sitting-balance support: Single extremity supported, No upper extremity supported, Feet supported Sitting balance-Leahy Scale: Fair   Postural control: Posterior lean Standing balance support: Bilateral upper extremity supported, Reliant on assistive device for balance Standing balance-Leahy Scale: Poor          ADL either performed or assessed with clinical judgement   ADL Overall ADL's : Needs assistance/impaired        Lower Body Dressing: Sitting/lateral leans;Sit to/from stand;Moderate assistance Lower Body Dressing Details (indicate cue type and reason): pt able to don L sock with significantly increased time/effort, required MOD A to complete other in sitting, anticipate at least MOD A for donning pants when involving transfers 2/2 decreased balance  Functional mobility during ADLs: Minimal assistance;Cueing for safety;Cueing for sequencing;Rolling walker (2 wheels)        Pertinent Vitals/Pain Pain Assessment Pain Assessment: Faces Faces Pain Scale: Hurts a little bit Pain Location: L  foot>R foot, L shoulder Pain Descriptors / Indicators: Grimacing, Aching Pain Intervention(s): Limited activity within patient's tolerance, Monitored during session, Repositioned     Extremity/Trunk Assessment Upper Extremity Assessment Upper Extremity Assessment: Generalized weakness   Lower Extremity Assessment Lower Extremity Assessment: Generalized weakness       Communication Communication Communication: Impaired Factors Affecting Communication: Hearing impaired   Cognition Arousal: Alert Behavior During Therapy: WFL for tasks assessed/performed Cognition: History of cognitive impairments, No family/caregiver present to determine baseline             OT - Cognition Comments: Pt alert and oriented to hospital and self, question HOH versus receptive language deficits, requires cues for sequencing, memory deficits evident                 Following commands: Impaired Following commands impaired: Follows one step commands with increased time, Follows multi-step commands inconsistently     Cueing  General Comments   Cueing Techniques: Verbal cues;Gestural cues;Tactile cues;Visual cues      Exercises     Shoulder Instructions      Home Living Family/patient expects to be discharged to:: Private residence Living Arrangements: Alone (son checks in multiple times a day) Available Help at Discharge: Family;Available PRN/intermittently Type of Home: House Home Access: Ramped entrance     Home Layout: Two level;Able to live on main level with bedroom/bathroom               Home Equipment: Rolling Walker (2 wheels);Cane - single point;Wheelchair - manual   Additional Comments: son contacted to verify info      Prior Functioning/Environment Prior Level of Function : Needs assist             Mobility Comments: varying levels, sometimes with RW, sometimes with WC like rollator, per son the last 3 days she has not gotten up/walked much at all due to  leg pain ADLs Comments: pt denies need for assist for bathing, dressing, and toileting    OT Problem List: Decreased strength;Pain;Decreased cognition;Increased edema;Decreased safety awareness;Decreased activity tolerance;Impaired balance (sitting and/or standing);Decreased knowledge of use of DME or AE   OT Treatment/Interventions: Self-care/ADL training;Therapeutic exercise;Therapeutic activities;Cognitive remediation/compensation;DME and/or AE instruction;Patient/family education;Balance training      OT Goals(Current goals can be found in the care plan section)   Acute Rehab OT Goals Patient Stated Goal: go home OT Goal Formulation: With patient Time For Goal Achievement: 12/15/23 Potential to Achieve Goals: Fair ADL Goals Pt Will Perform Lower Body Dressing: sit to/from stand;sitting/lateral leans;with caregiver independent in assisting Pt Will Transfer to Toilet: with supervision;ambulating (LRAD, caregiver indep in assisting) Pt Will Perform Toileting - Clothing Manipulation and hygiene: sitting/lateral leans;sit to/from stand;with supervision   OT Frequency:  Min 2X/week    Co-evaluation              AM-PAC OT 6 Clicks Daily Activity     Outcome Measure Help from another person eating meals?: None Help from another person taking care of personal grooming?: A Little Help from another person toileting, which includes using toliet, bedpan, or urinal?: A Lot Help from another person bathing (including washing, rinsing, drying)?: A Lot Help from another person to put on and taking off regular upper body clothing?: A Little Help from another person to  put on and taking off regular lower body clothing?: A Lot 6 Click Score: 16   End of Session Equipment Utilized During Treatment: Rolling walker (2 wheels);Gait belt  Activity Tolerance: Patient tolerated treatment well Patient left: in chair;with call bell/phone within reach;with chair alarm set  OT Visit Diagnosis:  Unsteadiness on feet (R26.81);Muscle weakness (generalized) (M62.81);Other symptoms and signs involving cognitive function                Time: 8586-8567 OT Time Calculation (min): 19 min Charges:  OT General Charges $OT Visit: 1 Visit OT Evaluation $OT Eval Low Complexity: 1 Low OT Treatments $Self Care/Home Management : 8-22 mins  Warren SAUNDERS., MPH, MS, OTR/L ascom [phone] 12/01/23, 4:06 PM

## 2023-12-01 NOTE — Care Management Obs Status (Signed)
 MEDICARE OBSERVATION STATUS NOTIFICATION   Patient Details  Name: [name] MRN: [id number] Date of Birth: 12/25/1940   Medicare Observation Status Notification Given:  No (did not want a copy)    [name] 12/01/2023, 12:24 PM

## 2023-12-01 NOTE — Evaluation (Signed)
 Physical Therapy Evaluation Patient Details Name: [name] MRN: [id number] DOB: 07-22-1940 Today's Date: 12/01/2023  History of Present Illness  83 y.o. female with medical history significant of dementia, stage 3b CKD, CAD, bipolar, HLD, HTN, depression, T2DM, and CHF who presented on 8/17 with LE edema and concern for UTI.  Clinical Impression  Patient alert, oriented to self and place, disoriented to situation, and provided conflicting information about home setup/PLOF. PT confirmed with son via phone at baseline pt does vary; intermittently able to ambulate with RW, does use a WC like a rollator, able to perform ADLs. She lives alone but he comes at least 3 times a day to ensure she's eating/safety checks. Did endorse before presenting to the ED she had not been mobilizing for 3 days.  She was able to perform sit <> stand twice with minA, and then step pivot to and from [facility] with minA. First attempt with RW but pt unable to clear her feet enough to step pivot. Remainder of mobility performed with pt relying on chair arms/BSC support and verbal cues for technique. She did perform pericare in sitting. Pt up in recliner with needs in reach at end of session (unable to ambulate at this time due to pt complaints of BLE pain, unable to truly take a step forward with RW).  Overall the patient demonstrated deficits (see PT Problem List) that impede the patient's functional abilities, safety, and mobility and would benefit from skilled PT intervention.          If plan is discharge home, recommend the following: A lot of help with walking and/or transfers;A lot of help with bathing/dressing/bathroom;Assistance with feeding;Assist for transportation;Assistance with cooking/housework;Help with stairs or ramp for entrance   Can travel by private vehicle   Yes    Equipment Recommendations Other (comment) (TBD)  Recommendations for Other Services       Functional Status Assessment Patient  has had a recent decline in their functional status and demonstrates the ability to make significant improvements in function in a reasonable and predictable amount of time.     Precautions / Restrictions Precautions Precautions: Fall Recall of Precautions/Restrictions: Impaired Restrictions Weight Bearing Restrictions Per Provider Order: No      Mobility  Bed Mobility               General bed mobility comments: up in recliner at start/end of session    Transfers Overall transfer level: Needs assistance Equipment used: Rolling walker (2 wheels) Transfers: Sit to/from Stand, Bed to chair/wheelchair/BSC Sit to Stand: Min assist   Step pivot transfers: Min assist            Ambulation/Gait                  Stairs            Wheelchair Mobility     Tilt Bed    Modified Rankin (Stroke Patients Only)       Balance Overall balance assessment: Needs assistance Sitting-balance support: Single extremity supported, No upper extremity supported, Feet supported Sitting balance-Leahy Scale: Fair     Standing balance support: Bilateral upper extremity supported, Reliant on assistive device for balance Standing balance-Leahy Scale: Poor                               Pertinent Vitals/Pain Pain Assessment Pain Assessment: Faces Faces Pain Scale: Hurts a little bit Pain Location: L foot>R foot  Pain Descriptors / Indicators: Grimacing, Aching Pain Intervention(s): Limited activity within patient's tolerance, Monitored during session, Repositioned    Home Living Family/patient expects to be discharged to:: Private residence Living Arrangements: Alone (son checks in multiple times a day) Available Help at Discharge: Family;Available PRN/intermittently Type of Home: House Home Access: Ramped entrance       Home Layout: Two level;Able to live on main level with bedroom/bathroom Home Equipment: Rolling Walker (2 wheels);Cane - single  point;Wheelchair - manual Additional Comments: son contacted to verify info    Prior Function Prior Level of Function : Needs assist             Mobility Comments: varying levels, sometimes with RW, sometimes with WC like rollator, per son the last 3 days she has not gotten up/walked much at all due to leg pain ADLs Comments: pt denies need for assist for bathing, dressing, and toileting     Extremity/Trunk Assessment   Upper Extremity Assessment Upper Extremity Assessment: Generalized weakness    Lower Extremity Assessment Lower Extremity Assessment: Generalized weakness       Communication   Communication Communication: Impaired Factors Affecting Communication: Hearing impaired    Cognition   Behavior During Therapy: WFL for tasks assessed/performed   PT - Cognitive impairments: No family/caregiver present to determine baseline                       PT - Cognition Comments: pt oriented to self, place but family per phone endorsed patient is not at her baseline Following commands: Impaired Following commands impaired: Follows one step commands with increased time, Follows multi-step commands inconsistently     Cueing Cueing Techniques: Verbal cues, Gestural cues, Tactile cues, Visual cues     General Comments      Exercises Other Exercises Other Exercises: pericare in sitting with set up   Assessment/Plan    PT Assessment Patient needs continued PT services  PT Problem List Decreased strength;Decreased range of motion;Decreased activity tolerance;Decreased balance;Decreased mobility;Decreased knowledge of precautions;Decreased knowledge of use of DME;Decreased safety awareness       PT Treatment Interventions DME instruction;Balance training;Gait training;Neuromuscular re-education;Stair training;Functional mobility training;Patient/family education;Therapeutic activities;Therapeutic exercise    PT Goals (Current goals can be found in the Care  Plan section)  Acute Rehab PT Goals Patient Stated Goal: to get stronger PT Goal Formulation: With patient Time For Goal Achievement: 12/15/23 Potential to Achieve Goals: Good    Frequency Min 2X/week     Co-evaluation               AM-PAC PT 6 Clicks Mobility  Outcome Measure Help needed turning from your back to your side while in a flat bed without using bedrails?: A Little Help needed moving from lying on your back to sitting on the side of a flat bed without using bedrails?: A Little Help needed moving to and from a bed to a chair (including a wheelchair)?: A Little Help needed standing up from a chair using your arms (e.g., wheelchair or bedside chair)?: A Little Help needed to walk in hospital room?: A Lot Help needed climbing 3-5 steps with a railing? : Total 6 Click Score: 15    End of Session Equipment Utilized During Treatment: Gait belt Activity Tolerance: Patient tolerated treatment well Patient left: in chair;with call bell/phone within reach;with chair alarm set Nurse Communication: Mobility status PT Visit Diagnosis: Other abnormalities of gait and mobility (R26.89);Difficulty in walking, not elsewhere classified (R26.2);Muscle weakness (generalized) (  M62.81)    Time: 8545-8486 PT Time Calculation (min) (ACUTE ONLY): 19 min   Charges:   PT Evaluation $PT Eval Low Complexity: 1 Low PT Treatments $Therapeutic Activity: 8-22 mins PT General Charges $$ ACUTE PT VISIT: 1 Visit        Doyal Shams PT, DPT 4:07 PM,12/01/23

## 2023-12-01 NOTE — Plan of Care (Signed)

## 2023-12-01 NOTE — Consult Note (Signed)
 [facility] Psychiatric Consult Initial  Patient Name: .[name]  MRN: [id number]  DOB: 12/07/1940  Consult Order details:  Orders (From admission, onward)     Start     Ordered   11/30/23 1515  IP CONSULT TO PSYCHIATRY       Ordering Provider: Dicky Anes, MD  Provider:  (Not yet assigned)  Question Answer Comment  Place call to: psych md   Reason for Consult Consult   Diagnosis/Clinical Info for Consult: delusions, increasing hallucinations at home since discharge. please make recommendations for treatment of dementia, suspect behavioral disturbances      11/30/23 1515             Mode of Visit: In person    Psychiatry Consult Evaluation  Service Date: December 01, 2023 LOS:  LOS: 0 days  Chief Complaint   Primary Psychiatric Diagnoses  Delirium due to multiple etiologies R/O dementia with behavioral problems   Assessment  [name] is a 83 y.o. female admitted: Medically7 y.o. female with medical history significant of dementia, stage 3b CKD, CAD, and CHF who presented on 8/17 with LE edema and concern for UTI. Abnormal UA, culture pending, treating with Ceftriaxone .  Edema is dependent, with stasis dermatitis; Unna boots ordered.Patient presenting without sepsis physiology but with symptoms concerning for UTI.She previously had a + urine culture 6/16 with  E. Coli and Proteus, resistant only to Nitrofurantoin.Today's UA again appears to demonstrate infection with large LE, positive nitrite, many bacteria.Will continue treatment with Rocephin  (received first dose in the ER.Patient presenting with encephalopathy as evidenced by her increased confusion and hallucinations.While the patient does have underlying dementia, this is a change compared to her usual baseline mental status. Her acute metabolic encephalopathy is suspected to be due to a UTI at this time based on abnormal UA; urine culture is pending. Psychiatry is consulted to manage the behaviors related to  hallucinations in the context of delirium and confusion at baseline at home.    On assessment patient is noted to be psychiatric at baseline.  She is noted to be awake, alert oriented x 3 today.  She denies SI/HI/plan and denies hallucinations.  She is not displaying any confusion at this time.  No psychiatric indication for inpatient psychiatric hospitalization.  Will discontinue Celexa  given her prolonged QTc interval.  Chart review reports patient displaying confusion and hallucinations at home.  Will initiate Abilify  5 mg daily that safer than any other antipsychotic in the context of prolonged QTc. Diagnoses:  Active Hospital problems: Principal Problem:   UTI (urinary tract infection) Active Problems:   Essential hypertension   Hyperlipidemia   CKD (chronic kidney disease) stage 3, GFR 30-59 ml/min (HCC)   Coronary artery disease involving native coronary artery of native heart without angina pectoris   Type II diabetes mellitus with renal manifestations (HCC)   Chronic diastolic CHF (congestive heart failure) (HCC)   Dementia (HCC)   Bilateral lower extremity edema   Stasis dermatitis of both legs   DNR (do not resuscitate)    Plan   ## Psychiatric Medication Recommendations:  Abilify  5 mg daily  ## Medical Decision Making Capacity: Not specifically addressed in this encounter  ## Further Work-up:   -- most recent EKG on 8/117/25 had QtC of 561    ## Disposition:-- There are no psychiatric contraindications to discharge at this time  ## Behavioral / Environmental: -Delirium Precautions: Delirium Interventions for Nursing and Staff: - RN to open blinds every AM. - To  Bedside: Glasses, hearing aide, and pt's own shoes. Make available to patients. when possible and encourage use. - Encourage po fluids when appropriate, keep fluids within reach. - OOB to chair with meals. - Passive ROM exercises to all extremities with AM & PM care. - RN to assess orientation to person, time  and place QAM and PRN. - Recommend extended visitation hours with familiar family/friends as feasible. - Staff to minimize disturbances at night. Turn off television when pt asleep or when not in use.    ## Safety and Observation Level:  - Based on my clinical evaluation, I estimate the patient to be at LOW risk of self harm in the current setting. - At this time, we recommend  routine. This decision is based on my review of the chart including patient's history and current presentation, interview of the patient, mental status examination, and consideration of suicide risk including evaluating suicidal ideation, plan, intent, suicidal or self-harm behaviors, risk factors, and protective factors. This judgment is based on our ability to directly address suicide risk, implement suicide prevention strategies, and develop a safety plan while the patient is in the clinical setting. Please contact our team if there is a concern that risk level has changed.  CSSR Risk Category:C-SSRS RISK CATEGORY: No Risk  Suicide Risk Assessment: Patient has following modifiable risk factors for suicide: pain, medical illness (ie new dx of cancer), which we are addressing by medication management. Patient has following non-modifiable or demographic risk factors for suicide: caucasian Patient has the following protective factors against suicide: Supportive family  Thank you for this consult request. Recommendations have been communicated to the primary team.  We will follow up as needed  at this time.   [name], MD       History of Present Illness  Relevant Aspects of [facility]   Patient Report:  On interview patient is noted to be resting in bed.  She has hard of hearing and questions how to be repeated multiple times.  Interview was limited as questions how to be written down on the paper for the patient to answer due to her hearing impairment.    She was able to answer the orientation questions  including being at [facility], date as August 2025 and president as Nancyann Frohlich.  She reports that she has hurt both her knees and shoulder and has low back pain for the reason for hospitalization.  She denies having any falls.  She denies feeling depressed or anxious.  She denies any nightmares or flashbacks.  She denies current suicidal ideation.  She denies auditory/visual hallucinations.  She denies having any outpatient psychiatrist at this time and denies taking any medications for her mood.  She denies any previous suicide attempts.      Psychiatric and Social History  Psychiatric History:  Information collected from patient/chart  Prev Dx/Sx: Unknown Current Psych Provider: Denies Home Meds (current): Denies Previous Med Trials: Unknown Therapy: Denies  Prior Psych Hospitalization: Denies Prior Self Harm: Denies Prior Violence: Unknown  Family Psych History: Unknown Family Hx suicide: Denies  Social History:   Educational Hx: Unknown Occupational Hx: No job Armed forces operational officer Hx: Unknown Living Situation: Lives with son Spiritual Hx: Unknown Access to weapons/lethal means: None reported  Substance History Alcohol: Denies  Tobacco: Denies Illicit drugs: Denies Prescription drug abuse: Denies Rehab hx: Denies  Exam Findings  Physical Exam: Reviewed and agree with the physical exam findings conducted by the medical provider Vital Signs:  Temp:  [98.6 F (37 C)-99 F (37.2 C)] 99 F (37.2 C) (08/18 1518) Pulse Rate:  [52-68] 56 (08/18 1518) Resp:  [12-18] 18 (08/18 1518) BP: (112-147)/(46-57) 124/53 (08/18 1518) SpO2:  [97 %-100 %] 98 % (08/18 1518) Blood pressure (!) 124/53, pulse (!) 56, temperature 99 F (37.2 C), resp. rate 18, height 5' 3 (1.6 m), weight 68 kg, SpO2 98%. Body mass index is 26.56 kg/m.    Mental Status Exam: General Appearance: Casual  Orientation:  Full (Time, Place, and Person)  Memory:  Immediate;    Fair Recent;   Poor Remote;   Fair  Concentration:  Concentration: Fair and Attention Span: Fair  Recall:  Fair  Attention  Fair  Eye Contact:  Fair  Speech:  Clear and Coherent  Language:  Fair  Volume:  Normal  Mood: fine  Affect:  Congruent  Thought Process:  Coherent  Thought Content:  Logical  Suicidal Thoughts:  No  Homicidal Thoughts:  No  Judgement:  Impaired  Insight:  Fair  Psychomotor Activity:  Normal  Akathisia:  No  Fund of Knowledge:  Fair      Assets:  Communication Skills  Cognition:  WNL  ADL's:  Intact  AIMS (if indicated):        Other History   These have been pulled in through the EMR, reviewed, and updated if appropriate.  Family History:  The patient's family history includes Heart disease in her sister.  Medical History: Past Medical History:  Diagnosis Date   Alzheimer dementia (HCC)    unknown stage   Arthritis    hands, feet, knees, shoulders (05/05/2014)   Bipolar disorder (HCC)    Cataracts, bilateral    immature   Chronic kidney disease    dr says they aren't functioning like they should (05/05/2014)   Coronary artery disease    Depression    takes Zoloft  daily   History of bronchitis 74yrs ago   Hyperlipidemia    takes Atorvastatin  daily   Hypertension    takes Metoprolol ,Lisinopril ,and Apresoline  daily   Joint pain    Peripheral edema    takes Chlothalidone daily   Squamous cell cancer of skin of nose 2012   right   Squamous cell cancer of skin of nose    Type II diabetes mellitus (HCC)    takes Tradjenta  daily    Surgical History: Past Surgical History:  Procedure Laterality Date   COLONOSCOPY WITH PROPOFOL  N/A 02/03/2018   Procedure: COLONOSCOPY WITH PROPOFOL ;  Surgeon: Gaylyn Gladis PENNER, MD;  Location[facility] ENDOSCOPY;  Service: Endoscopy;  Laterality: N/A;   CORONARY ARTERY BYPASS GRAFT  05/12/2007   CABG X1; at [facility]   coronary artery bypass with arterial grafts     DILATION AND CURETTAGE OF UTERUS  2005    FRACTURE SURGERY     I & D EXTREMITY Right 04/30/2014   Procedure: IRRIGATION AND DEBRIDEMENT EXTREMITY;  Surgeon: Norleen Armor, MD;  Location: [facility] OR;  Service: Orthopedics;  Laterality: Right;   INCISION AND DRAINAGE Right 08/11/2014   Procedure: REMOVAL OF DEEP IMPLANTS OF RIGHT TIBIAL FIBIAL INCISION AND DRAINAGE OF RIGHT ANKLE WOUND;  Surgeon: Norleen Armor, MD;  Location: [facility] OR;  Service: Orthopedics;  Laterality: Right;   ORIF ANKLE FRACTURE Right 04/30/2014   Procedure: OPEN REDUCTION INTERNAL FIXATION (ORIF) ANKLE FRACTURE;  Surgeon: Norleen Armor, MD;  Location: [facility] OR;  Service: Orthopedics;  Laterality: Right;   REMOVAL OF IMPLANT Right 08/11/2014   Procedure: REMOVAL OF DEEP  IMPLANTS OF RIGHT TIBIAL FIBIAL INCISION AND DRAINAGE OF RIGHT ANKLE WOUND;  Surgeon: Norleen Armor, MD;  Location: [facility] OR;  Service: Orthopedics;  Laterality: Right;   SQUAMOUS CELL CARCINOMA EXCISION Right 2012   nose   VAGINAL HYSTERECTOMY  2007     Medications:   Current Facility-Administered Medications:    acetaminophen  (TYLENOL ) tablet 650 mg, 650 mg, Oral, Q6H PRN **OR** acetaminophen  (TYLENOL ) suppository 650 mg, 650 mg, Rectal, Q6H PRN, Barbarann Nest, MD   amLODipine  (NORVASC ) tablet 10 mg, 10 mg, Oral, Daily, Barbarann Nest, MD, 10 mg at 12/01/23 1005   aspirin  EC tablet 81 mg, 81 mg, Oral, Daily, Barbarann Nest, MD, 81 mg at 12/01/23 1005   bisacodyl  (DULCOLAX) EC tablet 5 mg, 5 mg, Oral, Daily PRN, Barbarann Nest, MD   cefTRIAXone  (ROCEPHIN ) 1 g in sodium chloride  0.9 % 100 mL IVPB, 1 g, Intravenous, Q24H, Barbarann Nest, MD   citalopram  (CELEXA ) tablet 20 mg, 20 mg, Oral, Daily, Barbarann Nest, MD, 20 mg at 12/01/23 1005   docusate sodium  (COLACE) capsule 100 mg, 100 mg, Oral, BID, Barbarann Nest, MD, 100 mg at 12/01/23 1005   enoxaparin  (LOVENOX ) injection 30 mg, 30 mg, Subcutaneous, Q24H, Barbarann Nest, MD, 30 mg at 11/30/23 2056   gabapentin  (NEURONTIN ) capsule 300 mg, 300 mg, Oral, Q1200,  Barbarann Nest, MD, 300 mg at 12/01/23 1226   hydrALAZINE  (APRESOLINE ) injection 5 mg, 5 mg, Intravenous, Q4H PRN, Barbarann Nest, MD   ondansetron  (ZOFRAN ) tablet 4 mg, 4 mg, Oral, Q6H PRN **OR** ondansetron  (ZOFRAN ) injection 4 mg, 4 mg, Intravenous, Q6H PRN, Barbarann Nest, MD   polyethylene glycol (MIRALAX  / GLYCOLAX ) packet 17 g, 17 g, Oral, Daily PRN, Barbarann Nest, MD   potassium chloride  SA (KLOR-CON  M) CR tablet 40 mEq, 40 mEq, Oral, Once, Barbarann Nest, MD   rosuvastatin  (CRESTOR ) tablet 40 mg, 40 mg, Oral, QPM, Barbarann Nest, MD, 40 mg at 11/30/23 2055  Allergies: Allergies  Allergen Reactions   Codeine Nausea And Vomiting    Makes me deathly sick    Sylar Voong, MD

## 2023-12-01 NOTE — Hospital Course (Signed)
 83 y.o. female with medical history significant of dementia, stage 3b CKD, CAD, and CHF who presented on 8/17 with LE edema and concern for UTI. Abnormal UA, culture with pansensitive E coli.  Edema is dependent, with stasis dermatitis; Unna boots ordered.  Will need SNF placement, if possible (obs status).

## 2023-12-01 NOTE — Progress Notes (Signed)
 Progress Note   Patient: [name] FMW:[id number] DOB: 19-Aug-1940 DOA: 11/30/2023     0 DOS: the patient was seen and examined on 12/01/2023   Brief hospital course: 83 y.o. female with medical history significant of dementia, stage 3b CKD, CAD, and CHF who presented on 8/17 with LE edema and concern for UTI. Abnormal UA, culture pending, treating with Ceftriaxone .  Edema is dependent, with stasis dermatitis; Unna boots ordered.  Assessment and Plan:  UTI Patient presenting without sepsis physiology but with symptoms concerning for UTI She previously had a + urine culture 6/16 with  E. Coli and Proteus, resistant only to Nitrofurantoin Today's UA again appears to demonstrate infection with large LE, positive nitrite, many bacteria Will continue treatment with Rocephin  (received first dose in the ER) Urine culture with E coli but pending sensitivities Will observe overnight, anticipate discharge to home 8/19   Acute metabolic encephalopathy superimposed on dementia Patient presenting with encephalopathy as evidenced by her increased confusion and hallucinations While the patient does have underlying dementia, this is a change compared to her usual baseline mental status Her acute metabolic encephalopathy is suspected to be due to a UTI at this time based on abnormal UA; urine culture is pending. Delirium precautions Continue citalopram  Per discussion with son, she is better today but not yet back to baseline so will monitor for 1 additional day Will also order PT/OT consults   LE Edema with statis dermatitis Unremarkable BNP Negative BLE DVT US  Suspect dependent edema based on her son's report Will order Unna boots, as she does have stasis dermatitis   Stage 3b CKD Appears to be stable at this time Attempt to avoid nephrotoxic medications Recheck BMP in AM    CAD No report of CP Continue ASA, rosuvastatin    HTN Continue amlodipine    DM Prior A1c 5.1 Hold  pioglitazone  She does not appear to need ongoing medications for DM at this time, goal A1c <8 based on age Carb modified diet  Continue gabapentin  for neuropathy (although this could contribute to encephalopathy, this is a chronic medication and so this seems less likely to be the cause)   Chronic diastolic CHF 07/05/2023 echo with preserved EF and grade 1 diastolic dysfunction LE edema appears to be from dependent edema rather than decompensated CHF Takes Lasix  prn, will hold for now   DNR I have discussed code status with the family and the patient would not desire resuscitation and would prefer to die a natural death should that situation arise. Patient will need a gold out of facility DNR form at the time of discharge        Consultants: PT OT  Procedures: None  Antibiotics: Ceftriaxone  8/17-      Subjective: Pleasant, no complaints.  She would like to go home, if possible.   I spoke with her son - she looked a lot better but she did complain of a cat in the floor that she saw.  She does this at baseline, but it is worse with infections.  Swelling is significantly improved.   Objective: Vitals:   12/01/23 0410 12/01/23 0722  BP: (!) 125/54 (!) 147/46  Pulse: 64 (!) 57  Resp:  17  Temp: 98.8 F (37.1 C) 98.7 F (37.1 C)  SpO2: 99% 97%    Intake/Output Summary (Last 24 hours) at 12/01/2023 1316 Last data filed at 12/01/2023 9361 Gross per 24 hour  Intake --  Output 700 ml  Net -700 ml   [employer]  11/30/23 1342  Weight: 68 kg    Exam:  General:  Appears calm and comfortable and is in NAD, malodorous Eyes:  normal lids, iris ENT:  grossly normal hearing, lips & tongue, mmm; poor dentition Cardiovascular:  RR with bradycardia. 3+ LE edema.  Respiratory:   CTA bilaterally with no wheezes/rales/rhonchi.  Normal respiratory effort. Abdomen:  soft, NT, ND Skin:  stasis dermatitis without significant lesions of BLE Musculoskeletal:  grossly normal tone  BUE/BLE, good ROM, no bony abnormality Psychiatric:  pleasant mood and affect, speech fluent and appropriate, AOx3 Neurologic:  CN 2-12 grossly intact, moves all extremities in coordinated fashion  Data Reviewed: I have reviewed the patient's lab results since admission.  Pertinent labs for today include:   K+ 3.2 BUN 32/Creatinine 1.65/GFR 31, stable today WBC 5.9 Hgb 9.6 UA: E coli, pending     Family Communication: None present; I spoke with her son by telephone  Disposition: Status is: Observation The patient remains OBS appropriate and will d/c before 2 midnights.     Time spent: 50 minutes  Unresulted Labs (From admission, onward)     Start     Ordered   Unscheduled  CBC with Differential/Platelet  Tomorrow morning,   R        12/01/23 1316   Unscheduled  Basic metabolic panel with GFR  Tomorrow morning,   R        12/01/23 1316             Author: Delon Herald, MD 12/01/2023 1:16 PM  For on call review [email or web address].

## 2023-12-01 NOTE — Plan of Care (Signed)
   Problem: Activity: Goal: Risk for activity intolerance will decrease Outcome: Progressing   Problem: Nutrition: Goal: Adequate nutrition will be maintained Outcome: Progressing   Problem: Coping: Goal: Level of anxiety will decrease Outcome: Progressing   Problem: Safety: Goal: Ability to remain free from injury will improve Outcome: Progressing
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
## 2023-12-02 DIAGNOSIS — N3000 Acute cystitis without hematuria: Secondary | ICD-10-CM | POA: Diagnosis not present

## 2023-12-02 LAB — BASIC METABOLIC PANEL WITH GFR
Anion gap: 11 (ref 5–15)
BUN: 27 mg/dL — ABNORMAL HIGH (ref 8–23)
CO2: 25 mmol/L (ref 22–32)
Calcium: 10 mg/dL (ref 8.9–10.3)
Chloride: 103 mmol/L (ref 98–111)
Creatinine, Ser: 1.4 mg/dL — ABNORMAL HIGH (ref 0.44–1.00)
GFR, Estimated: 37 mL/min — ABNORMAL LOW (ref >60)
Glucose, Bld: 120 mg/dL — ABNORMAL HIGH (ref 70–99)
Potassium: 3.4 mmol/L — ABNORMAL LOW (ref 3.5–5.1)
Sodium: 139 mmol/L (ref 135–145)

## 2023-12-02 LAB — CBC WITH DIFFERENTIAL/PLATELET
Abs Immature Granulocytes: 0.03 K/uL (ref 0.00–0.07)
Basophils Absolute: 0 K/uL (ref 0.0–0.1)
Basophils Relative: 0 %
Eosinophils Absolute: 0 K/uL (ref 0.0–0.5)
Eosinophils Relative: 0 %
HCT: 30.1 % — ABNORMAL LOW (ref 36.0–46.0)
Hemoglobin: 9.9 g/dL — ABNORMAL LOW (ref 12.0–15.0)
Immature Granulocytes: 0 %
Lymphocytes Relative: 22 %
Lymphs Abs: 1.5 K/uL (ref 0.7–4.0)
MCH: 27.7 pg (ref 26.0–34.0)
MCHC: 32.9 g/dL (ref 30.0–36.0)
MCV: 84.3 fL (ref 80.0–100.0)
Monocytes Absolute: 0.8 K/uL (ref 0.1–1.0)
Monocytes Relative: 12 %
Neutro Abs: 4.6 K/uL (ref 1.7–7.7)
Neutrophils Relative %: 66 %
Platelets: 187 K/uL (ref 150–400)
RBC: 3.57 MIL/uL — ABNORMAL LOW (ref 3.87–5.11)
RDW: 14.8 % (ref 11.5–15.5)
WBC: 6.9 K/uL (ref 4.0–10.5)
nRBC: 0 % (ref 0.0–0.2)

## 2023-12-02 LAB — URINE CULTURE: Culture: >=100000 — AB

## 2023-12-02 MED ORDER — CEPHALEXIN 250 MG PO CAPS
250.0000 mg | ORAL_CAPSULE | Freq: Three times a day (TID) | ORAL | Status: DC
  Administered 2023-12-02 – 2023-12-05 (×10): 250 mg via ORAL
  Filled 2023-12-02 (×11): qty 1
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
## 2023-12-02 NOTE — Progress Notes (Signed)
 PT Cancellation Note  Patient Details Name: [name] MRN: [id number] DOB: [birth year]   Cancelled Treatment:    Reason Eval/Treat Not Completed: Other (comment) Pt very agitated and eyes closed throughout, not willing to participate. Attempted to physically assist pt to sit EOB, pt began to scream out in pain. Pt's mentation appeared altered since seeing pt yesterday afternoon, RN informed. PT to attempt at later time.    Hinton Luellen, SPT

## 2023-12-02 NOTE — Progress Notes (Signed)
 Progress Note   Patient: [name] FMW:[id number] DOB: 1941-01-15 DOA: 11/30/2023     0 DOS: the patient was seen and examined on 12/02/2023   Brief hospital course: 83 y.o. female with medical history significant of dementia, stage 3b CKD, CAD, and CHF who presented on 8/17 with LE edema and concern for UTI. Abnormal UA, culture with pansensitive E coli.  Edema is dependent, with stasis dermatitis; Unna boots ordered.  Will need SNF placement, if possible (obs status).  Assessment and Plan:  UTI Patient presenting without sepsis physiology but with symptoms concerning for UTI She previously had a + urine culture 6/16 with  E. Coli and Proteus, resistant only to Nitrofurantoin Today's UA again appears to demonstrate infection with large LE, positive nitrite, many bacteria Will continue treatment with Rocephin  (received first dose in the ER) Urine culture with E coli but pending sensitivities Observed   Acute metabolic encephalopathy superimposed on dementia Patient presenting with encephalopathy as evidenced by her increased confusion and hallucinations While the patient does have underlying dementia, this is a change compared to her usual baseline mental status Her acute metabolic encephalopathy is suspected to be due to a UTI at this time based on abnormal UA; urine culture is pending. Delirium precautions Continue citalopram  PT/OT consulted, recommending SNF rehab   LE Edema with statis dermatitis Unremarkable BNP Negative BLE DVT US  Suspect dependent edema based on her son's report Will order Unna boots, as she does have stasis dermatitis   Stage 3b CKD Appears to be stable at this time Attempt to avoid nephrotoxic medications  CAD No report of CP Continue ASA, rosuvastatin    HTN Continue amlodipine    DM Prior A1c 5.1 Hold pioglitazone  She does not appear to need ongoing medications for DM at this time, goal A1c <8 based on age Carb modified diet  Continue  gabapentin  for neuropathy (although this could contribute to encephalopathy, this is a chronic medication and so this seems less likely to be the cause)   Chronic diastolic CHF 07/05/2023 echo with preserved EF and grade 1 diastolic dysfunction LE edema appears to be from dependent edema rather than decompensated CHF Takes Lasix  prn, will hold for now   DNR I have discussed code status with the family and the patient would not desire resuscitation and would prefer to die a natural death should that situation arise. Patient will need a gold out of facility DNR form at the time of discharge          Consultants: PT OT TOC team   Procedures: None   Antibiotics: Ceftriaxone  8/17-19 Keflex  8/19-24        Subjective: Less engaged today, seems confused.   I was unable to reach her son.  I spoke with her daughter-in-law.     Objective: Vitals:   12/02/23 0425 12/02/23 0909  BP: 139/64 127/61  Pulse: 71 72  Resp: (!) 24 20  Temp: 99.5 F (37.5 C) 99.3 F (37.4 C)  SpO2: 91% 95%    Intake/Output Summary (Last 24 hours) at 12/02/2023 1358 Last data filed at 12/01/2023 1900 Gross per 24 hour  Intake 0 ml  Output --  Net 0 ml   Filed Weights   11/30/23 1342  Weight: 68 kg    Exam:  General:  Appears calm and comfortable and is in NAD, malodorous Eyes:  normal lids, iris ENT:  grossly normal hearing, lips & tongue, mmm; poor dentition Cardiovascular:  RR with bradycardia. 3+ LE edema.  Respiratory:  CTA bilaterally with no wheezes/rales/rhonchi.  Normal respiratory effort. Abdomen:  soft, NT, ND Skin:  stasis dermatitis without significant lesions of BLE Musculoskeletal:  grossly normal tone BUE/BLE, good ROM, no bony abnormality Psychiatric:  pleasant mood and affect, speech fluent and appropriate, AOx3 Neurologic:  CN 2-12 grossly intact, moves all extremities in coordinated fashion  Data Reviewed: I have reviewed the patient's lab results since admission.   Pertinent labs for today include:   K+ 3.4 Glucose 120 BUN 27/Creatinine 1.4/GFR 37 WBC 6.9 Hgb 9.9 Urine culture with >100k colonies of pansensitive E coli     Family Communication: None present; I spoke with DIL by telephone  Disposition: Status is: Observation The patient remains OBS appropriate and will d/c before 2 midnights.     Time spent: 50 minutes  Unresulted Labs (From admission, onward)    None        Author: Delon Herald, MD 12/02/2023 1:58 PM  For on call review [email or web address].

## 2023-12-02 NOTE — Plan of Care (Signed)
  Problem: Education: Goal: Knowledge of General Education information will improve Description: Including pain rating scale, medication(s)/side effects and non-pharmacologic comfort measures Outcome: Progressing   Problem: Clinical Measurements: Goal: Ability to maintain clinical measurements within normal limits will improve Outcome: Progressing Goal: Will remain free from infection Outcome: Progressing Goal: Diagnostic test results will improve Outcome: Progressing   Problem: Nutrition: Goal: Adequate nutrition will be maintained Outcome: Progressing   Problem: Elimination: Goal: Will not experience complications related to urinary retention Outcome: Progressing   Problem: Safety: Goal: Ability to remain free from injury will improve Outcome: Progressing   Problem: Skin Integrity: Goal: Risk for impaired skin integrity will decrease Outcome: Progressing

## 2023-12-03 DIAGNOSIS — Z7982 Long term (current) use of aspirin: Secondary | ICD-10-CM | POA: Diagnosis not present

## 2023-12-03 DIAGNOSIS — N390 Urinary tract infection, site not specified: Secondary | ICD-10-CM | POA: Diagnosis present

## 2023-12-03 DIAGNOSIS — E1129 Type 2 diabetes mellitus with other diabetic kidney complication: Secondary | ICD-10-CM | POA: Diagnosis not present

## 2023-12-03 DIAGNOSIS — F0283 Dementia in other diseases classified elsewhere, unspecified severity, with mood disturbance: Secondary | ICD-10-CM | POA: Diagnosis present

## 2023-12-03 DIAGNOSIS — Z90710 Acquired absence of both cervix and uterus: Secondary | ICD-10-CM | POA: Diagnosis not present

## 2023-12-03 DIAGNOSIS — F319 Bipolar disorder, unspecified: Secondary | ICD-10-CM | POA: Diagnosis present

## 2023-12-03 DIAGNOSIS — I872 Venous insufficiency (chronic) (peripheral): Secondary | ICD-10-CM | POA: Diagnosis present

## 2023-12-03 DIAGNOSIS — Z85828 Personal history of other malignant neoplasm of skin: Secondary | ICD-10-CM | POA: Diagnosis not present

## 2023-12-03 DIAGNOSIS — Z8249 Family history of ischemic heart disease and other diseases of the circulatory system: Secondary | ICD-10-CM | POA: Diagnosis not present

## 2023-12-03 DIAGNOSIS — G9341 Metabolic encephalopathy: Secondary | ICD-10-CM | POA: Diagnosis present

## 2023-12-03 DIAGNOSIS — B9620 Unspecified Escherichia coli [E. coli] as the cause of diseases classified elsewhere: Secondary | ICD-10-CM | POA: Diagnosis present

## 2023-12-03 DIAGNOSIS — I10 Essential (primary) hypertension: Secondary | ICD-10-CM | POA: Diagnosis not present

## 2023-12-03 DIAGNOSIS — Z79899 Other long term (current) drug therapy: Secondary | ICD-10-CM | POA: Diagnosis not present

## 2023-12-03 DIAGNOSIS — R41841 Cognitive communication deficit: Secondary | ICD-10-CM | POA: Diagnosis not present

## 2023-12-03 DIAGNOSIS — Z951 Presence of aortocoronary bypass graft: Secondary | ICD-10-CM | POA: Diagnosis not present

## 2023-12-03 DIAGNOSIS — E1140 Type 2 diabetes mellitus with diabetic neuropathy, unspecified: Secondary | ICD-10-CM | POA: Diagnosis present

## 2023-12-03 DIAGNOSIS — E1122 Type 2 diabetes mellitus with diabetic chronic kidney disease: Secondary | ICD-10-CM | POA: Diagnosis present

## 2023-12-03 DIAGNOSIS — G309 Alzheimer's disease, unspecified: Secondary | ICD-10-CM | POA: Diagnosis present

## 2023-12-03 DIAGNOSIS — R410 Disorientation, unspecified: Secondary | ICD-10-CM | POA: Diagnosis present

## 2023-12-03 DIAGNOSIS — R2689 Other abnormalities of gait and mobility: Secondary | ICD-10-CM | POA: Diagnosis not present

## 2023-12-03 DIAGNOSIS — Z66 Do not resuscitate: Secondary | ICD-10-CM | POA: Diagnosis present

## 2023-12-03 DIAGNOSIS — I878 Other specified disorders of veins: Secondary | ICD-10-CM | POA: Diagnosis present

## 2023-12-03 DIAGNOSIS — I130 Hypertensive heart and chronic kidney disease with heart failure and stage 1 through stage 4 chronic kidney disease, or unspecified chronic kidney disease: Secondary | ICD-10-CM | POA: Diagnosis present

## 2023-12-03 DIAGNOSIS — I5032 Chronic diastolic (congestive) heart failure: Secondary | ICD-10-CM | POA: Diagnosis present

## 2023-12-03 DIAGNOSIS — M6281 Muscle weakness (generalized): Secondary | ICD-10-CM | POA: Diagnosis not present

## 2023-12-03 DIAGNOSIS — R488 Other symbolic dysfunctions: Secondary | ICD-10-CM | POA: Diagnosis not present

## 2023-12-03 DIAGNOSIS — I2510 Atherosclerotic heart disease of native coronary artery without angina pectoris: Secondary | ICD-10-CM | POA: Diagnosis present

## 2023-12-03 DIAGNOSIS — E785 Hyperlipidemia, unspecified: Secondary | ICD-10-CM | POA: Diagnosis present

## 2023-12-03 DIAGNOSIS — N1832 Chronic kidney disease, stage 3b: Secondary | ICD-10-CM | POA: Diagnosis present

## 2023-12-03 DIAGNOSIS — R262 Difficulty in walking, not elsewhere classified: Secondary | ICD-10-CM | POA: Diagnosis not present

## 2023-12-03 DIAGNOSIS — R32 Unspecified urinary incontinence: Secondary | ICD-10-CM | POA: Diagnosis present

## 2023-12-03 DIAGNOSIS — F0282 Dementia in other diseases classified elsewhere, unspecified severity, with psychotic disturbance: Secondary | ICD-10-CM | POA: Diagnosis present

## 2023-12-03 DIAGNOSIS — N3000 Acute cystitis without hematuria: Secondary | ICD-10-CM | POA: Diagnosis not present

## 2023-12-03 NOTE — Plan of Care (Signed)
  Problem: Education: Goal: Knowledge of General Education information will improve Description: Including pain rating scale, medication(s)/side effects and non-pharmacologic comfort measures Outcome: Progressing   Problem: Health Behavior/Discharge Planning: Goal: Ability to manage health-related needs will improve Outcome: Progressing   Problem: Clinical Measurements: Goal: Ability to maintain clinical measurements within normal limits will improve Outcome: Progressing Goal: Will remain free from infection Outcome: Progressing Goal: Diagnostic test results will improve Outcome: Progressing Goal: Respiratory complications will improve Outcome: Progressing Goal: Cardiovascular complication will be avoided Outcome: Progressing   Problem: Activity: Goal: Risk for activity intolerance will decrease Outcome: Progressing   Problem: Nutrition: Goal: Adequate nutrition will be maintained Outcome: Progressing   Problem: Elimination: Goal: Will not experience complications related to bowel motility Outcome: Progressing Goal: Will not experience complications related to urinary retention Outcome: Progressing   Problem: Pain Managment: Goal: General experience of comfort will improve and/or be controlled Outcome: Progressing   Problem: Skin Integrity: Goal: Risk for impaired skin integrity will decrease Outcome: Progressing

## 2023-12-03 NOTE — Consult Note (Addendum)
 WOC Nurse Consult Note: Reason for Consult: Appy unna boot. Wound type: no open wound, venous insufficiency. Pressure Injury POA: NA Scheduled to apply bilateral unna boots weekly, starting tomorrow 08/21.   Ordered supplies for room.  WOC team will follow THURS. Please reconsult if further assistance is needed. Thank-you,  Lela Holm RN, CNS, [employer], MSN.  (Phone [phone])

## 2023-12-03 NOTE — Plan of Care (Signed)
  Problem: Education: Goal: Knowledge of General Education information will improve Description: Including pain rating scale, medication(s)/side effects and non-pharmacologic comfort measures Outcome: Progressing   Problem: Health Behavior/Discharge Planning: Goal: Ability to manage health-related needs will improve Outcome: Progressing   Problem: Clinical Measurements: Goal: Ability to maintain clinical measurements within normal limits will improve Outcome: Progressing Goal: Will remain free from infection Outcome: Progressing   Problem: Nutrition: Goal: Adequate nutrition will be maintained Outcome: Progressing   Problem: Pain Managment: Goal: General experience of comfort will improve and/or be controlled Outcome: Progressing   Problem: Safety: Goal: Ability to remain free from injury will improve Outcome: Progressing

## 2023-12-03 NOTE — Progress Notes (Signed)
 Occupational Therapy Treatment Patient Details Name: [name] MRN: [id number] DOB: 11-08-1940 Today's Date: 12/03/2023   History of present illness 83 y.o. female with medical history significant of dementia, stage 3b CKD, CAD, bipolar, HLD, HTN, depression, T2DM, and CHF who presented on 8/17 with LE edema and concern for UTI.   OT comments  Pt seen for OT treatment on this date. Upon arrival to room pt supine in bed with HOB elevated, agreeable to tx. Pt requires chaining techniques, increased time for processing information, and cuing for task initiation, follow-through, and cessation however did engage in light ADLs and ADL transfers on this session.  Ongoing barriers include decreased cognition, pain, decreased activity tolerance, decreased functional standing balance, and decreased BUE strength. Pt making good progress toward goals, will continue to follow POC. Discharge recommendation remains appropriate.        If plan is discharge home, recommend the following:  A little help with walking and/or transfers;A lot of help with bathing/dressing/bathroom;Assistance with cooking/housework;Assist for transportation;Help with stairs or ramp for entrance;Direct supervision/assist for medications management;Supervision due to cognitive status;Direct supervision/assist for financial management   Equipment Recommendations  BSC/3in1    Recommendations for Other Services      Precautions / Restrictions Precautions Precautions: Fall Recall of Precautions/Restrictions: Impaired Restrictions Weight Bearing Restrictions Per Provider Order: No       Mobility Bed Mobility Overal bed mobility: Needs Assistance Bed Mobility: Supine to Sit     Supine to sit: Min assist, HOB elevated, Used rails          Transfers Overall transfer level: Needs assistance Equipment used: Rolling walker (2 wheels) Transfers: Sit to/from Stand Sit to Stand: Min assist     Step pivot transfers:  Min assist     General transfer comment: Min A with VC for hand placement with increased time required to process instructions for sequencing     Balance Overall balance assessment: Needs assistance Sitting-balance support: Single extremity supported, No upper extremity supported, Feet supported Sitting balance-Leahy Scale: Fair     Standing balance support: Bilateral upper extremity supported, Reliant on assistive device for balance Standing balance-Leahy Scale: Poor                             ADL either performed or assessed with clinical judgement   ADL Overall ADL's : Needs assistance/impaired     Grooming: Wash/dry face;Supervision/safety;Bed level           Upper Body Dressing : Minimal assistance;Sitting   Lower Body Dressing: Minimal assistance Lower Body Dressing Details (indicate cue type and reason): Min to Mod A for donning L sock while long sitting/sitting EOB             Functional mobility during ADLs: Minimal assistance;Cueing for safety;Cueing for sequencing;Rolling walker (2 wheels)      Extremity/Trunk Assessment Upper Extremity Assessment Upper Extremity Assessment: Generalized weakness   Lower Extremity Assessment Lower Extremity Assessment: Defer to PT evaluation        Vision Patient Visual Report: No change from baseline     Perception     Praxis     Communication Communication Communication: Impaired Factors Affecting Communication: Hearing impaired   Cognition Arousal: Alert Behavior During Therapy: WFL for tasks assessed/performed Cognition: History of cognitive impairments, No family/caregiver present to determine baseline             OT - Cognition Comments: Requires cues for safety awareness and  sequencing, task initiation, follow-through, and cessation however with chaining techniques and increased time for processing did engage in ADLs                 Following commands: Impaired Following  commands impaired: Follows one step commands with increased time, Follows multi-step commands inconsistently      Cueing   Cueing Techniques: Verbal cues, Gestural cues, Tactile cues, Visual cues  Exercises Other Exercises Other Exercises: single step instruction with forward and backward chaining technique incorporated into ADL session    Shoulder Instructions       General Comments      Pertinent Vitals/ Pain       Pain Assessment Pain Assessment: Faces Faces Pain Scale: Hurts little more Pain Location: pt would not report which location was in pain, pain assessed via FACES scale Pain Descriptors / Indicators: Grimacing, Aching Pain Intervention(s): Monitored during session, Limited activity within patient's tolerance, Repositioned  Home Living                                          Prior Functioning/Environment              Frequency  Min 2X/week        Progress Toward Goals  OT Goals(current goals can now be found in the care plan section)  Progress towards OT goals: Progressing toward goals  Acute Rehab OT Goals Potential to Achieve Goals: Fair  Plan      Co-evaluation                 AM-PAC OT 6 Clicks Daily Activity     Outcome Measure   Help from another person eating meals?: None Help from another person taking care of personal grooming?: A Little Help from another person toileting, which includes using toliet, bedpan, or urinal?: A Lot Help from another person bathing (including washing, rinsing, drying)?: A Lot Help from another person to put on and taking off regular upper body clothing?: A Little Help from another person to put on and taking off regular lower body clothing?: A Lot 6 Click Score: 16    End of Session Equipment Utilized During Treatment: Rolling walker (2 wheels)  OT Visit Diagnosis: Unsteadiness on feet (R26.81);Muscle weakness (generalized) (M62.81);Other symptoms and signs involving cognitive  function   Activity Tolerance Patient tolerated treatment well   Patient Left in bed;with call bell/phone within reach;with chair alarm set   Nurse Communication Mobility status        Time: 8653-8585 OT Time Calculation (min): 28 min  Charges: OT General Charges $OT Visit: 1 Visit OT Treatments $Self Care/Home Management : 23-37 mins  Harlene Sharps OTR/L   Harlene LITTIE Sharps 12/03/2023, 2:44 PM

## 2023-12-03 NOTE — Progress Notes (Signed)
 Physical Therapy Treatment Patient Details Name: [name] MRN: [id number] DOB: [birth year] Today's Date: 12/03/2023   History of Present Illness 83 y.o. female with medical history significant of dementia, stage 3b CKD, CAD, bipolar, HLD, HTN, depression, T2DM, and CHF who presented on 8/17 with LE edema and concern for UTI.    PT Comments  Pt is making limited progress towards goals with ability to perform OOB mobility with transfer bed->chair. Limited by confusion. Still complains of pain in B knees. Will continue to progress towards goals.   If plan is discharge home, recommend the following: A lot of help with walking and/or transfers;A lot of help with bathing/dressing/bathroom;Assistance with feeding;Assist for transportation;Assistance with cooking/housework;Help with stairs or ramp for entrance   Can travel by private vehicle     Yes  Equipment Recommendations   (TBD)    Recommendations for Other Services       Precautions / Restrictions Precautions Precautions: Fall Recall of Precautions/Restrictions: Impaired Restrictions Weight Bearing Restrictions Per Provider Order: No     Mobility  Bed Mobility Overal bed mobility: Needs Assistance Bed Mobility: Supine to Sit     Supine to sit: Min assist, HOB elevated, Used rails     General bed mobility comments: needs assist for scooting out towards EOB. Moans out in pain when use of chux pads to scoot hips out to edge. Pt appears limited by fear in addition to pain. Takes extended time for completing transfer. Once seated at EOB, fair balance noted    Transfers Overall transfer level: Needs assistance Equipment used: 1 person hand held assist Transfers: Sit to/from Stand Sit to Stand: Mod assist   Step pivot transfers: Min assist       General transfer comment: needs assist for positioning feet and initial lift off. Once standing, able to take steps over to recliner with manual facilitation for weight  shifting and lining up in front of recliner    Ambulation/Gait               General Gait Details: not able to perform due to pain/weakness   Stairs             Wheelchair Mobility     Tilt Bed    Modified Rankin (Stroke Patients Only)       Balance Overall balance assessment: Needs assistance Sitting-balance support: Single extremity supported, No upper extremity supported, Feet supported Sitting balance-Leahy Scale: Fair     Standing balance support: Bilateral upper extremity supported, Reliant on assistive device for balance Standing balance-Leahy Scale: Poor                              Communication Communication Communication: Impaired Factors Affecting Communication: Hearing impaired  Cognition Arousal: Alert Behavior During Therapy: WFL for tasks assessed/performed   PT - Cognitive impairments: No family/caregiver present to determine baseline                       PT - Cognition Comments: oriented to self Following commands: Impaired Following commands impaired: Follows one step commands with increased time, Follows multi-step commands inconsistently    Cueing Cueing Techniques: Verbal cues, Gestural cues, Tactile cues, Visual cues  Exercises      General Comments        Pertinent Vitals/Pain Pain Assessment Pain Assessment: Faces Faces Pain Scale: Hurts even more Pain Location: appears to have pain all over, most localized to B  knees Pain Descriptors / Indicators: Grimacing, Aching Pain Intervention(s): Limited activity within patient's tolerance, Repositioned    Home Living                          Prior Function            PT Goals (current goals can now be found in the care plan section) Acute Rehab PT Goals Patient Stated Goal: to get stronger PT Goal Formulation: With patient Time For Goal Achievement: 12/15/23 Potential to Achieve Goals: Good Progress towards PT goals: Progressing  toward goals    Frequency    Min 2X/week      PT Plan      Co-evaluation              AM-PAC PT 6 Clicks Mobility   Outcome Measure  Help needed turning from your back to your side while in a flat bed without using bedrails?: A Little Help needed moving from lying on your back to sitting on the side of a flat bed without using bedrails?: A Little Help needed moving to and from a bed to a chair (including a wheelchair)?: A Little Help needed standing up from a chair using your arms (e.g., wheelchair or bedside chair)?: A Little Help needed to walk in hospital room?: A Lot Help needed climbing 3-5 steps with a railing? : Total 6 Click Score: 15    End of Session Equipment Utilized During Treatment: Gait belt Activity Tolerance: Patient tolerated treatment well Patient left: in chair;with call bell/phone within reach;with chair alarm set Nurse Communication: Mobility status PT Visit Diagnosis: Other abnormalities of gait and mobility (R26.89);Difficulty in walking, not elsewhere classified (R26.2);Muscle weakness (generalized) (M62.81)     Time: 8550-8496 PT Time Calculation (min) (ACUTE ONLY): 14 min  Charges:    $Therapeutic Activity: 8-22 mins PT General Charges $$ ACUTE PT VISIT: 1 Visit                     Corean Dade, PT, DPT, GCS [phone]    Quamel Fitzmaurice 12/03/2023, 4:03 PM

## 2023-12-03 NOTE — Progress Notes (Addendum)
 PROGRESS NOTE    [name]  FMW:[id number] DOB: [birth year] DOA: 11/30/2023 PCP: Albina GORMAN Dine, MD   Assessment & Plan:   Principal Problem:   UTI (urinary tract infection) Active Problems:   Essential hypertension   Hyperlipidemia   Type II diabetes mellitus with renal manifestations (HCC)   Chronic diastolic CHF (congestive heart failure) (HCC)   CKD (chronic kidney disease) stage 3, GFR 30-59 ml/min (HCC)   Coronary artery disease involving native coronary artery of native heart without angina pectoris   Dementia (HCC)   Bilateral lower extremity edema   Stasis dermatitis of both legs   DNR (do not resuscitate)  Assessment and Plan: UTI: urine cx growing e. coli. Continue on keflex     Acute metabolic encephalopathy: superimposed on dementia. Likely secondary to above infection. Re-orient prn   LE Edema: w/ statis dermatitis. US  of b/l LE was neg for DVT. Continue w/ supportive care   CKDIIIb: Cr is trending down from day prior. Avoid nephrotoxic meds  Hx of CAD: no chest pain. Continue on home dose of aspirin , statin    HTN: continue on home dose of amlodipine   Continue amlodipine    DM2: well controlled, previous HbA1c 5.1. No need for SSI or blood sugar checks currently. Carb modified diet    Peripheral neuropathy: continue on home dose of gabapentin    Chronic diastolic CHF: echo with preserved EF and grade 1 diastolic dysfunction. Appears compensated. Monitor I/Os. Holding home lasix          DVT prophylaxis: lovenox  Code Status:  DNR Family Communication: called pt's son, John, and no answer Disposition Plan: possibly d/c to SNF  Level of care: Med-Surg  Status is: Inpatient Remains inpatient appropriate because: needs SNF placement     Consultants:    Procedures:   Antimicrobials: keflex   Subjective: Pt c/o fatigue   Objective: Vitals:   12/02/23 1802 12/02/23 1957 12/03/23 0558 12/03/23 0832  BP: 131/65 (!) 146/63 (!) 130/49  (!) 141/69  Pulse: 81 78 74 70  Resp: 16 16 16 18   Temp: 98.3 F (36.8 C) 99.7 F (37.6 C) 98.6 F (37 C) 98.2 F (36.8 C)  TempSrc: Oral Oral    SpO2: 95% 100% 99% 96%  Weight:      Height:        Intake/Output Summary (Last 24 hours) at 12/03/2023 0959 Last data filed at 12/03/2023 0616 Gross per 24 hour  Intake 0 ml  Output 800 ml  Net -800 ml   Filed Weights   11/30/23 1342  Weight: 68 kg    Examination:  General exam: Appears calm and comfortable  Respiratory system: Clear to auscultation. Respiratory effort normal. Cardiovascular system: S1 & S2+. No rubs, gallops or clicks Gastrointestinal system: Abdomen is nondistended, soft and nontender. Normal bowel sounds heard. Central nervous system: Alert and awake. Moves all extremities  Psychiatry: Judgement and insight appears not at baseline. Flat mood and affect.     Data Reviewed: I have personally reviewed following labs and imaging studies  CBC: Recent Labs  Lab 11/30/23 1354 12/01/23 0455 12/02/23 0459  WBC 6.5 5.9 6.9  NEUTROABS 3.6  --  4.6  HGB 10.1* 9.6* 9.9*  HCT 31.9* 29.4* 30.1*  MCV 86.2 83.8 84.3  PLT 200 191 187   Basic Metabolic Panel: Recent Labs  Lab 11/30/23 1354 12/01/23 0455 12/02/23 0459  NA 141 141 139  K 3.3* 3.2* 3.4*  CL 104 105 103  CO2 23 26 25   GLUCOSE 72  81 120*  BUN 30* 32* 27*  CREATININE 1.62* 1.65* 1.40*  CALCIUM  10.9* 10.0 10.0   GFR: Estimated Creatinine Clearance: 28.2 mL/min (A) (by C-G formula based on SCr of 1.4 mg/dL (H)). Liver Function Tests: No results for input(s): AST, ALT, ALKPHOS, BILITOT, PROT, ALBUMIN in the last 168 hours. No results for input(s): LIPASE, AMYLASE in the last 168 hours. No results for input(s): AMMONIA in the last 168 hours. Coagulation Profile: No results for input(s): INR, PROTIME in the last 168 hours. Cardiac Enzymes: No results for input(s): CKTOTAL, CKMB, CKMBINDEX, TROPONINI in the last  168 hours. BNP (last 3 results) No results for input(s): PROBNP in the last 8760 hours. HbA1C: No results for input(s): HGBA1C in the last 72 hours. CBG: No results for input(s): GLUCAP in the last 168 hours. Lipid Profile: No results for input(s): CHOL, HDL, LDLCALC, TRIG, CHOLHDL, LDLDIRECT in the last 72 hours. Thyroid  Function Tests: No results for input(s): TSH, T4TOTAL, FREET4, T3FREE, THYROIDAB in the last 72 hours. Anemia Panel: No results for input(s): VITAMINB12, FOLATE, FERRITIN, TIBC, IRON , RETICCTPCT in the last 72 hours. Sepsis Labs: Recent Labs  Lab 11/30/23 1354  LATICACIDVEN 0.9    Recent Results (from the past 240 hours)  Urine Culture     Status: Abnormal   Collection Time: 11/30/23  1:45 PM   Specimen: Urine, Random  Result Value Ref Range Status   Specimen Description   Final    URINE, RANDOM Performed at [facility], [address] [place], [place] [address]    Special Requests   Final    NONE Reflexed from [id number] Performed at [facility], [address] [place], [place] [address]    Culture >=100,000 COLONIES/mL ESCHERICHIA COLI (A)  Final   Report Status 12/02/2023 FINAL  Final   Organism ID, Bacteria ESCHERICHIA COLI (A)  Final      Susceptibility   Escherichia coli - MIC*    AMPICILLIN 4 SENSITIVE Sensitive     CEFAZOLIN  (URINE) Value in next row Sensitive      2 SENSITIVEThis is a modified FDA-approved test that has been validated and its performance characteristics determined by the reporting laboratory.  This laboratory is certified under the Clinical Laboratory Improvement Amendments CLIA as qualified to perform high complexity clinical laboratory testing.    CEFEPIME  Value in next row Sensitive      2 SENSITIVEThis is a modified FDA-approved test that has been validated and its performance characteristics determined by the reporting laboratory.  This laboratory is certified under  the Clinical Laboratory Improvement Amendments CLIA as qualified to perform high complexity clinical laboratory testing.    ERTAPENEM Value in next row Sensitive      2 SENSITIVEThis is a modified FDA-approved test that has been validated and its performance characteristics determined by the reporting laboratory.  This laboratory is certified under the Clinical Laboratory Improvement Amendments CLIA as qualified to perform high complexity clinical laboratory testing.    CEFTRIAXONE  Value in next row Sensitive      2 SENSITIVEThis is a modified FDA-approved test that has been validated and its performance characteristics determined by the reporting laboratory.  This laboratory is certified under the Clinical Laboratory Improvement Amendments CLIA as qualified to perform high complexity clinical laboratory testing.    CIPROFLOXACIN Value in next row Sensitive      2 SENSITIVEThis is a modified FDA-approved test that has been validated and its performance characteristics determined by the reporting laboratory.  This laboratory is  certified under the Clinical Laboratory Improvement Amendments CLIA as qualified to perform high complexity clinical laboratory testing.    GENTAMICIN Value in next row Sensitive      2 SENSITIVEThis is a modified FDA-approved test that has been validated and its performance characteristics determined by the reporting laboratory.  This laboratory is certified under the Clinical Laboratory Improvement Amendments CLIA as qualified to perform high complexity clinical laboratory testing.    NITROFURANTOIN Value in next row Sensitive      2 SENSITIVEThis is a modified FDA-approved test that has been validated and its performance characteristics determined by the reporting laboratory.  This laboratory is certified under the Clinical Laboratory Improvement Amendments CLIA as qualified to perform high complexity clinical laboratory testing.    TRIMETH/SULFA Value in next row Sensitive       2 SENSITIVEThis is a modified FDA-approved test that has been validated and its performance characteristics determined by the reporting laboratory.  This laboratory is certified under the Clinical Laboratory Improvement Amendments CLIA as qualified to perform high complexity clinical laboratory testing.    AMPICILLIN/SULBACTAM Value in next row Sensitive      2 SENSITIVEThis is a modified FDA-approved test that has been validated and its performance characteristics determined by the reporting laboratory.  This laboratory is certified under the Clinical Laboratory Improvement Amendments CLIA as qualified to perform high complexity clinical laboratory testing.    PIP/TAZO Value in next row Sensitive ug/mL     <=4 SENSITIVEThis is a modified FDA-approved test that has been validated and its performance characteristics determined by the reporting laboratory.  This laboratory is certified under the Clinical Laboratory Improvement Amendments CLIA as qualified to perform high complexity clinical laboratory testing.    MEROPENEM Value in next row Sensitive      <=4 SENSITIVEThis is a modified FDA-approved test that has been validated and its performance characteristics determined by the reporting laboratory.  This laboratory is certified under the Clinical Laboratory Improvement Amendments CLIA as qualified to perform high complexity clinical laboratory testing.    * >=100,000 COLONIES/mL ESCHERICHIA COLI         Radiology Studies: No results found.      Scheduled Meds:  amLODipine   10 mg Oral Daily   ARIPiprazole   5 mg Oral Daily   aspirin  EC  81 mg Oral Daily   cephALEXin   250 mg Oral Q8H   docusate sodium   100 mg Oral BID   enoxaparin  (LOVENOX ) injection  30 mg Subcutaneous Q24H   gabapentin   300 mg Oral Q1200   rosuvastatin   40 mg Oral QPM   Continuous Infusions:   LOS: 0 days       Anthony CHRISTELLA Pouch, MD [facility] Pager [phone]  If 7PM-7AM, please contact  night-coverage [email or web address] 12/03/2023, 9:59 AM

## 2023-12-03 NOTE — TOC Progression Note (Addendum)
 Transition of Care [facility]) - Progression Note    Patient Details  Name: [name] MRN: [id number] Date of Birth: [birth year]  [facility] CM/SW Contact  Alfonso Rummer, LCSW Phone Number: 12/03/2023, 1:12 PM  Clinical Narrative:   LCSW A. Rummer spk with pt son and daughter in law via phone. Pt son is working out of town and desires to have pt transferred to [facility] due being a previous patient at this facility. Pt son and daughter advised of accepted choices [facility] and [facility] and verbalized understanding. Pt daughter in law reports pt lives alone however son checks on mother frequently.  Approved JluyPI:[id number] Dates:8/20-8/22/2025 Next Review Date: 12/05/2023     Expected Discharge Plan: Skilled Nursing Facility Barriers to Discharge: Barriers Resolved               Expected Discharge Plan and Services       Living arrangements for the past 2 months: (P) Single Family Home Patient son accepts bed offer to [facility].                                        Social Drivers of Health (SDOH) Interventions SDOH Screenings   Food Insecurity: No Food Insecurity (11/30/2023)  Housing: Low Risk  (11/30/2023)  Transportation Needs: No Transportation Needs (11/30/2023)  Utilities: Not At Risk (11/30/2023)  Depression (PHQ2-9): High Risk (04/17/2023)  Social Connections: Socially Isolated (11/30/2023)  Tobacco Use: Low Risk  (11/30/2023)    Readmission Risk Interventions     No data to display

## 2023-12-04 DIAGNOSIS — R410 Disorientation, unspecified: Secondary | ICD-10-CM | POA: Diagnosis not present

## 2023-12-04 DIAGNOSIS — N3000 Acute cystitis without hematuria: Secondary | ICD-10-CM | POA: Diagnosis not present

## 2023-12-04 LAB — BASIC METABOLIC PANEL WITH GFR
Anion gap: 12 (ref 5–15)
BUN: 23 mg/dL (ref 8–23)
CO2: 24 mmol/L (ref 22–32)
Calcium: 10.5 mg/dL — ABNORMAL HIGH (ref 8.9–10.3)
Chloride: 101 mmol/L (ref 98–111)
Creatinine, Ser: 1.47 mg/dL — ABNORMAL HIGH (ref 0.44–1.00)
GFR, Estimated: 35 mL/min — ABNORMAL LOW (ref >60)
Glucose, Bld: 110 mg/dL — ABNORMAL HIGH (ref 70–99)
Potassium: 3.3 mmol/L — ABNORMAL LOW (ref 3.5–5.1)
Sodium: 137 mmol/L (ref 135–145)

## 2023-12-04 MED ORDER — ARIPIPRAZOLE 15 MG PO TABS
7.5000 mg | ORAL_TABLET | Freq: Every day | ORAL | Status: DC
  Administered 2023-12-05: 7.5 mg via ORAL
  Filled 2023-12-04: qty 1

## 2023-12-04 NOTE — Progress Notes (Signed)
 Occupational Therapy Treatment Patient Details Name: [name] MRN: [id number] DOB: 1940-08-05 Today's Date: 12/04/2023   History of present illness 83 y.o. female with medical history significant of dementia, stage 3b CKD, CAD, bipolar, HLD, HTN, depression, T2DM, and CHF who presented on 8/17 with LE edema and concern for UTI.   OT comments  Pt seen for OT tx. Pt received in the recliner, alerts to OT's voice and endorses some pain in BLE and reports having slept well the night prior. Pt agreeable to session. OT stepped out of the room for ~69min to retrieve grooming items and when OT returned pt sleeping. MD also entered room for assessment. OT and MD attempted to rouse pt, requiring multimodal cues and noxious stimuli to respond briefly. Vitals taken. Pt then became more awake and able to complete seated grooming task with set up and cues for initiating and terminating. Pt endorsing room is spinning.  Further ADL/mobility deferred. All needs in reach. MD aware of pt reported dizziness. Will continue to progress as able.       If plan is discharge home, recommend the following:  A lot of help with bathing/dressing/bathroom;Assistance with cooking/housework;Assist for transportation;Help with stairs or ramp for entrance;Direct supervision/assist for medications management;Supervision due to cognitive status;Direct supervision/assist for financial management;A lot of help with walking and/or transfers   Equipment Recommendations  BSC/3in1    Recommendations for Other Services      Precautions / Restrictions Precautions Precautions: Fall Recall of Precautions/Restrictions: Impaired Restrictions Weight Bearing Restrictions Per Provider Order: No       Mobility Bed Mobility               General bed mobility comments: NT, in recliner    Transfers                   General transfer comment: deferred 2/2 fluctuating alertness     Balance                                            ADL either performed or assessed with clinical judgement   ADL Overall ADL's : Needs assistance/impaired     Grooming: Sitting;Wash/dry face;Set up;Cueing for sequencing Grooming Details (indicate cue type and reason): VC for initiating, tends to keep eyes closed; toothbrushing deferred 2/2 questionable alertness                                    Extremity/Trunk Assessment              Vision       Perception     Praxis     Communication Communication Communication: Impaired Factors Affecting Communication: Hearing impaired   Cognition Arousal: Lethargic, Stuporous, Obtunded Behavior During Therapy: Flat affect Cognition: History of cognitive impairments, No family/caregiver present to determine baseline             OT - Cognition Comments: Pt initially alert, responds to therapist well and able to answer questions regarding pain, how she slept. Then appears to be asleep after OT came back ~35min later with supplies for session. Pt then did not respond to verbal cues, tactile cues, required painful stimuli to grimace and mildly pull away. Vitals taken, MD in room. No concerns. Pt then became more awake endorsing room is spinning. Able  to complete grooming task with cues.                 Following commands: Impaired Following commands impaired: Follows one step commands with increased time, Follows multi-step commands inconsistently      Cueing   Cueing Techniques: Verbal cues, Gestural cues, Tactile cues, Visual cues  Exercises      Shoulder Instructions       General Comments      Pertinent Vitals/ Pain       Pain Assessment Pain Assessment: Faces Faces Pain Scale: Hurts little more Pain Location: BLE knees to feet per pt Pain Descriptors / Indicators: Grimacing, Aching Pain Intervention(s): Limited activity within patient's tolerance, Monitored during session, Repositioned  Home Living                                           Prior Functioning/Environment              Frequency  Min 2X/week        Progress Toward Goals  OT Goals(current goals can now be found in the care plan section)  Progress towards OT goals: OT to reassess next treatment  Acute Rehab OT Goals Patient Stated Goal: go home OT Goal Formulation: With patient Time For Goal Achievement: 12/15/23 Potential to Achieve Goals: Fair  Plan      Co-evaluation                 AM-PAC OT 6 Clicks Daily Activity     Outcome Measure   Help from another person eating meals?: None Help from another person taking care of personal grooming?: A Little Help from another person toileting, which includes using toliet, bedpan, or urinal?: A Lot Help from another person bathing (including washing, rinsing, drying)?: A Lot Help from another person to put on and taking off regular upper body clothing?: A Little Help from another person to put on and taking off regular lower body clothing?: A Lot 6 Click Score: 16    End of Session    OT Visit Diagnosis: Unsteadiness on feet (R26.81);Muscle weakness (generalized) (M62.81);Other symptoms and signs involving cognitive function   Activity Tolerance Patient limited by lethargy   Patient Left with call bell/phone within reach;with chair alarm set;in chair   Nurse Communication  (MD aware of pt's fluctuating alertness, vitals)        Time: 1006-1020 OT Time Calculation (min): 14 min  Charges: OT General Charges $OT Visit: 1 Visit OT Treatments $Self Care/Home Management : 8-22 mins  Warren SAUNDERS., MPH, MS, OTR/L ascom [phone] 12/04/23, 10:38 AM

## 2023-12-04 NOTE — Consult Note (Signed)
 [facility] Psychiatric Consult Follow up  Patient Name: .PAYTAN RECINE  MRN: [id number]  DOB: 1940-05-22  Consult Order details:  Orders (From admission, onward)     Start     Ordered   11/30/23 1515  IP CONSULT TO PSYCHIATRY       Ordering Provider: Dicky Anes, MD  Provider:  (Not yet assigned)  Question Answer Comment  Place call to: psych md   Reason for Consult Consult   Diagnosis/Clinical Info for Consult: delusions, increasing hallucinations at home since discharge. please make recommendations for treatment of dementia, suspect behavioral disturbances      11/30/23 1515             Mode of Visit: In person    Psychiatry Consult Evaluation  Service Date: December 04, 2023 LOS:  LOS: 1 day  Chief Complaint   Primary Psychiatric Diagnoses  Delirium due to multiple etiologies R/O dementia with behavioral problems   Assessment  Miraya Cudney Bye is a 83 y.o. female admitted: Medically2 y.o. female with medical history significant of dementia, stage 3b CKD, CAD, and CHF who presented on 8/17 with LE edema and concern for UTI. Abnormal UA, culture pending, treating with Ceftriaxone .  Edema is dependent, with stasis dermatitis; Unna boots ordered.Patient presenting without sepsis physiology but with symptoms concerning for UTI.She previously had a + urine culture 6/16 with  E. Coli and Proteus, resistant only to Nitrofurantoin.Today's UA again appears to demonstrate infection with large LE, positive nitrite, many bacteria.Will continue treatment with Rocephin  (received first dose in the ER.Patient presenting with encephalopathy as evidenced by her increased confusion and hallucinations.While the patient does have underlying dementia, this is a change compared to her usual baseline mental status. Her acute metabolic encephalopathy is suspected to be due to a UTI at this time based on abnormal UA; urine culture is pending. Psychiatry is consulted to manage the behaviors related  to hallucinations in the context of delirium and confusion at baseline at home.    On assessment patient had no episodes of agitation today but is noted to have intermittent confusion.Nurse reports patient receiving PRNs for visual hallucinations of seeing snakes last night.  Will increase  Abilify  to 7.5 mg daily that safer than any other antipsychotic in the context of prolonged QTc. Diagnoses:  Active Hospital problems: Principal Problem:   UTI (urinary tract infection) Active Problems:   Essential hypertension   Hyperlipidemia   CKD (chronic kidney disease) stage 3, GFR 30-59 ml/min (HCC)   Coronary artery disease involving native coronary artery of native heart without angina pectoris   Type II diabetes mellitus with renal manifestations (HCC)   Chronic diastolic CHF (congestive heart failure) (HCC)   Dementia (HCC)   Bilateral lower extremity edema   Stasis dermatitis of both legs   DNR (do not resuscitate)    Plan   ## Psychiatric Medication Recommendations:  Abilify  7.5 mg daily  ## Medical Decision Making Capacity: Not specifically addressed in this encounter  ## Further Work-up:   -- most recent EKG on 11/30/23 had QtC of 561- WILL REPEAT EKG    ## Disposition:-- There are no psychiatric contraindications to discharge at this time  ## Behavioral / Environmental: -Delirium Precautions: Delirium Interventions for Nursing and Staff: - RN to open blinds every AM. - To Bedside: Glasses, hearing aide, and pt's own shoes. Make available to patients. when possible and encourage use. - Encourage po fluids when appropriate, keep fluids within reach. - OOB to chair with meals. -  Passive ROM exercises to all extremities with AM & PM care. - RN to assess orientation to person, time and place QAM and PRN. - Recommend extended visitation hours with familiar family/friends as feasible. - Staff to minimize disturbances at night. Turn off television when pt asleep or when not in  use.    ## Safety and Observation Level:  - Based on my clinical evaluation, I estimate the patient to be at LOW risk of self harm in the current setting. - At this time, we recommend  routine. This decision is based on my review of the chart including patient's history and current presentation, interview of the patient, mental status examination, and consideration of suicide risk including evaluating suicidal ideation, plan, intent, suicidal or self-harm behaviors, risk factors, and protective factors. This judgment is based on our ability to directly address suicide risk, implement suicide prevention strategies, and develop a safety plan while the patient is in the clinical setting. Please contact our team if there is a concern that risk level has changed.  CSSR Risk Category:C-SSRS RISK CATEGORY: No Risk  Suicide Risk Assessment: Patient has following modifiable risk factors for suicide: pain, medical illness (ie new dx of cancer), which we are addressing by medication management. Patient has following non-modifiable or demographic risk factors for suicide: caucasian Patient has the following protective factors against suicide: Supportive family  Thank you for this consult request. Recommendations have been communicated to the primary team.  We will follow up as needed  at this time.   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 Living Situation: Lives with  son Spiritual Hx: Unknown Access to weapons/lethal means: None reported  Substance History Alcohol: Denies  Tobacco: Denies Illicit drugs: Denies Prescription drug abuse: Denies Rehab hx: Denies  Exam Findings  Physical Exam: Reviewed and agree with the physical exam findings conducted by the medical provider Vital Signs:  Temp:  [98.7 F (37.1 C)-99.6 F (37.6 C)] 99.3 F (37.4 C) (08/21 2154) Pulse Rate:  [56-99] 81 (08/21 2154) Resp:  [16-19] 19 (08/21 2154) BP: (114-134)/(51-63) 128/63 (08/21 2154) SpO2:  [95 %-97 %] 96 % (08/21 2154) Blood pressure 128/63, pulse 81, temperature 99.3 F (37.4 C), temperature source Oral, resp. rate 19, height 5' 3 (1.6 m), weight 68 kg, SpO2 96%. Body mass index is 26.56 kg/m.    Mental Status Exam: General Appearance: Casual  Orientation:  Full (Time, Place, and Person)  Memory:  Immediate;   Fair Recent;   Poor Remote;   Fair  Concentration:  Concentration: Fair and Attention Span: Fair  Recall:  Fair  Attention  Fair  Eye Contact:  Fair  Speech:  Clear and Coherent  Language:  Fair  Volume:  Normal  Mood: fine  Affect:  Congruent  Thought Process:  Coherent  Thought Content:  Logical  Suicidal Thoughts:  No  Homicidal Thoughts:  No  Judgement:  Impaired  Insight:  Fair  Psychomotor Activity:  Normal  Akathisia:  No  Fund of Knowledge:  Fair      Assets:  Manufacturing systems engineer  Cognition:  WNL  ADL's:  Intact  AIMS (if indicated):        Other History   These have been pulled in through the EMR, reviewed, and updated if appropriate.  Family History:  The patient's family history includes Heart disease in her sister.  Medical History: Past Medical History:  Diagnosis Date   Alzheimer dementia (HCC)    unknown stage   Arthritis    hands, feet, knees, shoulders (05/05/2014)   Bipolar disorder (HCC)    Cataracts, bilateral    immature   Chronic kidney disease    dr says they aren't functioning like they  should (05/05/2014)   Coronary artery disease    Depression    takes Zoloft  daily   History of bronchitis 34yrs ago   Hyperlipidemia    takes Atorvastatin  daily   Hypertension    takes Metoprolol ,Lisinopril ,and Apresoline  daily   Joint pain    Peripheral edema    takes Chlothalidone daily   Squamous cell cancer of skin of nose 2012   right   Squamous cell cancer of skin of nose    Type II diabetes mellitus (HCC)    takes Tradjenta  daily    Surgical History: Past Surgical History:  Procedure Laterality Date   COLONOSCOPY WITH PROPOFOL  N/A 02/03/2018   Procedure: COLONOSCOPY WITH PROPOFOL ;  Surgeon: Gaylyn Gladis PENNER, MD;  Location: [facility] ENDOSCOPY;  Service: Endoscopy;  Laterality: N/A;   CORONARY ARTERY BYPASS GRAFT  05/12/2007   CABG X1; at [facility]   coronary artery bypass with arterial grafts     DILATION AND CURETTAGE OF UTERUS  2005   FRACTURE SURGERY     I & D EXTREMITY Right 04/30/2014   Procedure: IRRIGATION AND DEBRIDEMENT EXTREMITY;  Surgeon: Norleen Armor, MD;  Location: [facility] OR;  Service: Orthopedics;  Laterality: Right;   INCISION AND DRAINAGE Right 08/11/2014   Procedure: REMOVAL OF DEEP IMPLANTS OF RIGHT TIBIAL FIBIAL INCISION AND DRAINAGE OF RIGHT ANKLE WOUND;  Surgeon: Norleen Armor, MD;  Location: [facility] OR;  Service: Orthopedics;  Laterality: Right;   ORIF ANKLE FRACTURE Right 04/30/2014   Procedure: OPEN REDUCTION INTERNAL FIXATION (ORIF) ANKLE FRACTURE;  Surgeon: Norleen Armor, MD;  Location: [facility] OR;  Service: Orthopedics;  Laterality: Right;   REMOVAL OF IMPLANT Right 08/11/2014   Procedure: REMOVAL OF DEEP IMPLANTS OF RIGHT TIBIAL FIBIAL INCISION AND DRAINAGE OF RIGHT ANKLE WOUND;  Surgeon: Norleen Armor, MD;  Location: [facility] OR;  Service: Orthopedics;  Laterality: Right;   SQUAMOUS CELL CARCINOMA EXCISION Right 2012   nose   VAGINAL HYSTERECTOMY  2007     Medications:   Current Facility-Administered Medications:    acetaminophen  (TYLENOL ) tablet 650 mg, 650 mg, Oral,  Q6H PRN **OR** acetaminophen  (TYLENOL ) suppository 650 mg, 650 mg, Rectal, Q6H PRN, Barbarann Nest, MD   amLODipine  (NORVASC ) tablet 10 mg, 10 mg, Oral, Daily, Barbarann Nest, MD, 10 mg at 12/04/23 0809   [START ON 12/05/2023] ARIPiprazole  (ABILIFY ) tablet 7.5 mg, 7.5 mg, Oral, Daily, Ryeleigh Santore, MD   aspirin  EC tablet 81 mg, 81 mg, Oral, Daily, Barbarann Nest, MD, 81 mg at 12/04/23 0809   bisacodyl  (DULCOLAX) EC tablet 5 mg, 5 mg, Oral, Daily PRN, Barbarann Nest, MD   cephALEXin  (KEFLEX ) capsule 250 mg, 250 mg, Oral, Q8H, Barbarann Nest, MD, 250 mg at 12/04/23 2132   docusate sodium  (COLACE) capsule 100 mg, 100 mg, Oral, BID, Barbarann Nest, MD, 100 mg at 12/04/23  2132   enoxaparin  (LOVENOX ) injection 30 mg, 30 mg, Subcutaneous, Q24H, Barbarann Nest, MD, 30 mg at 12/04/23 1633   gabapentin  (NEURONTIN ) capsule 300 mg, 300 mg, Oral, Q1200, Barbarann Nest, MD, 300 mg at 12/04/23 1147   hydrALAZINE  (APRESOLINE ) injection 5 mg, 5 mg, Intravenous, Q4H PRN, Barbarann Nest, MD   ondansetron  (ZOFRAN ) tablet 4 mg, 4 mg, Oral, Q6H PRN **OR** ondansetron  (ZOFRAN ) injection 4 mg, 4 mg, Intravenous, Q6H PRN, Barbarann Nest, MD   polyethylene glycol (MIRALAX  / GLYCOLAX ) packet 17 g, 17 g, Oral, Daily PRN, Barbarann Nest, MD, 17 g at 12/04/23 0810   rosuvastatin  (CRESTOR ) tablet 40 mg, 40 mg, Oral, QPM, Barbarann Nest, MD, 40 mg at 12/04/23 1633  Allergies: Allergies  Allergen Reactions   Codeine Nausea And Vomiting    Makes me deathly sick    Masai Kidd, MD

## 2023-12-04 NOTE — Progress Notes (Signed)
 Mobility Specialist - Progress Note    12/04/23 0900  Mobility  Activity Stood at bedside;Ambulated with assistance;Pivoted/transferred to/from [facility];Pivoted/transferred from bed to chair  Level of Assistance Moderate assist, patient does Database administrator Ambulated (ft) 4 ft  Range of Motion/Exercises Active  Activity Response Tolerated well  Mobility Referral Yes  Mobility visit 1 Mobility  Mobility Specialist Start Time (ACUTE ONLY) 0900  Mobility Specialist Stop Time (ACUTE ONLY) [id number]  Mobility Specialist Time Calculation (min) (ACUTE ONLY) 24 min   Pt resting in bed on RA upon entry. Pt is confused and required extra time to respond to any questions. Pt STS Mod/Max assist and required extra time to full stand due to weakness. Pt transferred to [facility] for several minutes and then transferred to chair. Pt left in recliner with needs in reach and chair alarm activated.   Guido Rumble Mobility Specialist 12/04/23, 9:30 AM

## 2023-12-04 NOTE — Plan of Care (Signed)
  Problem: Education: Goal: Knowledge of General Education information will improve Description: Including pain rating scale, medication(s)/side effects and non-pharmacologic comfort measures Outcome: Progressing   Problem: Health Behavior/Discharge Planning: Goal: Ability to manage health-related needs will improve Outcome: Progressing   Problem: Clinical Measurements: Goal: Ability to maintain clinical measurements within normal limits will improve Outcome: Progressing Goal: Will remain free from infection Outcome: Progressing Goal: Diagnostic test results will improve Outcome: Progressing Goal: Respiratory complications will improve Outcome: Progressing Goal: Cardiovascular complication will be avoided Outcome: Progressing   Problem: Activity: Goal: Risk for activity intolerance will decrease Outcome: Progressing   Problem: Nutrition: Goal: Adequate nutrition will be maintained Outcome: Progressing   Problem: Elimination: Goal: Will not experience complications related to bowel motility Outcome: Progressing Goal: Will not experience complications related to urinary retention Outcome: Progressing   Problem: Pain Managment: Goal: General experience of comfort will improve and/or be controlled Outcome: Progressing   Problem: Skin Integrity: Goal: Risk for impaired skin integrity will decrease Outcome: Progressing

## 2023-12-04 NOTE — Consult Note (Signed)
 WOC Nurse Consult Note: Requested to apply bilat Una boots. Pt has generalized edema to legs, no open wounds or drainage.  Applied bilat [employer] and coban. WOC team will plan to change again Q Thurs.  Thank-you,  Stephane Fought MSN, RN, CWOCN, CWCN-AP, CNS Contact Mon-Fri 0700-1500: [phone]

## 2023-12-04 NOTE — Progress Notes (Signed)
 PROGRESS NOTE    [name]  FMW:[id number] DOB: 10-06-1940 DOA: 11/30/2023 PCP: Albina GORMAN Dine, MD   Assessment & Plan:   Principal Problem:   UTI (urinary tract infection) Active Problems:   Essential hypertension   Hyperlipidemia   Type II diabetes mellitus with renal manifestations (HCC)   Chronic diastolic CHF (congestive heart failure) (HCC)   CKD (chronic kidney disease) stage 3, GFR 30-59 ml/min (HCC)   Coronary artery disease involving native coronary artery of native heart without angina pectoris   Dementia (HCC)   Bilateral lower extremity edema   Stasis dermatitis of both legs   DNR (do not resuscitate)  Assessment and Plan: UTI: urine cx growing e. coli. Continue on keflex    Acute metabolic encephalopathy: superimposed on dementia. Likely secondary to above infection. Re-orient prn   LE Edema: w/ statis dermatitis. US  of b/l LE was neg for DVT. Continue w/ supportive care    CKDIIIb: Cr is labile. Avoid nephrotoxic meds  Hx of CAD: no chest pain. Continue on home dose of statin, aspirin     HTN: continue on home dose of amlodipine     DM2: well controlled, previous HbA1c 5.1. No need for SSI or blood sugar checks currently. Carb modified diet    Peripheral neuropathy: continue on home dose of gabapentin    Chronic diastolic CHF: echo with preserved EF and grade 1 diastolic dysfunction. Appears compensated. Monitor I/Os. Holding home lasix          DVT prophylaxis: lovenox  Code Status:  DNR Family Communication:  Disposition Plan: possibly d/c to SNF  Level of care: Med-Surg  Status is: Inpatient Remains inpatient appropriate because: needs SNF placement     Consultants:    Procedures:   Antimicrobials: keflex   Subjective: Pt c/o dizziness & the room spinning around   Objective: Vitals:   12/03/23 1934 12/03/23 1939 12/04/23 0317 12/04/23 0742  BP: 127/60 114/60 134/62 (!) 116/51  Pulse:  93 70 (!) 56  Resp: 16  16 16   Temp:  98.9 F (37.2 C)  99.3 F (37.4 C) 99.6 F (37.6 C)  TempSrc:    Oral  SpO2: 95%  96% 95%  Weight:      Height:        Intake/Output Summary (Last 24 hours) at 12/04/2023 0833 Last data filed at 12/03/2023 1900 Gross per 24 hour  Intake 480 ml  Output 400 ml  Net 80 ml   Filed Weights   11/30/23 1342  Weight: 68 kg    Examination:  General exam: Appears comfortable Respiratory system: clear breath sounds b/l  Cardiovascular system: S1/S2+. No rubs or clicks  Gastrointestinal system: abd is soft, NT, ND & hypoactive bowel sounds  Central nervous system: alert & awake. Moves all extremities  Psychiatry: Judgement and insight appears at baseline. Flat mood and affect    Data Reviewed: I have personally reviewed following labs and imaging studies  CBC: Recent Labs  Lab 11/30/23 1354 12/01/23 0455 12/02/23 0459  WBC 6.5 5.9 6.9  NEUTROABS 3.6  --  4.6  HGB 10.1* 9.6* 9.9*  HCT 31.9* 29.4* 30.1*  MCV 86.2 83.8 84.3  PLT 200 191 187   Basic Metabolic Panel: Recent Labs  Lab 11/30/23 1354 12/01/23 0455 12/02/23 0459  NA 141 141 139  K 3.3* 3.2* 3.4*  CL 104 105 103  CO2 23 26 25   GLUCOSE 72 81 120*  BUN 30* 32* 27*  CREATININE 1.62* 1.65* 1.40*  CALCIUM  10.9* 10.0 10.0  GFR: Estimated Creatinine Clearance: 28.2 mL/min (A) (by C-G formula based on SCr of 1.4 mg/dL (H)). Liver Function Tests: No results for input(s): AST, ALT, ALKPHOS, BILITOT, PROT, ALBUMIN in the last 168 hours. No results for input(s): LIPASE, AMYLASE in the last 168 hours. No results for input(s): AMMONIA in the last 168 hours. Coagulation Profile: No results for input(s): INR, PROTIME in the last 168 hours. Cardiac Enzymes: No results for input(s): CKTOTAL, CKMB, CKMBINDEX, TROPONINI in the last 168 hours. BNP (last 3 results) No results for input(s): PROBNP in the last 8760 hours. HbA1C: No results for input(s): HGBA1C in the last 72  hours. CBG: No results for input(s): GLUCAP in the last 168 hours. Lipid Profile: No results for input(s): CHOL, HDL, LDLCALC, TRIG, CHOLHDL, LDLDIRECT in the last 72 hours. Thyroid  Function Tests: No results for input(s): TSH, T4TOTAL, FREET4, T3FREE, THYROIDAB in the last 72 hours. Anemia Panel: No results for input(s): VITAMINB12, FOLATE, FERRITIN, TIBC, IRON , RETICCTPCT in the last 72 hours. Sepsis Labs: Recent Labs  Lab 11/30/23 1354  LATICACIDVEN 0.9    Recent Results (from the past 240 hours)  Urine Culture     Status: Abnormal   Collection Time: 11/30/23  1:45 PM   Specimen: Urine, Random  Result Value Ref Range Status   Specimen Description   Final    URINE, RANDOM Performed at [facility], [address] [place], [place] [address]    Special Requests   Final    NONE Reflexed from [id number] Performed at [facility], [address] [place], [place] [address]    Culture >=100,000 COLONIES/mL ESCHERICHIA COLI (A)  Final   Report Status 12/02/2023 FINAL  Final   Organism ID, Bacteria ESCHERICHIA COLI (A)  Final      Susceptibility   Escherichia coli - MIC*    AMPICILLIN 4 SENSITIVE Sensitive     CEFAZOLIN  (URINE) Value in next row Sensitive      2 SENSITIVEThis is a modified FDA-approved test that has been validated and its performance characteristics determined by the reporting laboratory.  This laboratory is certified under the Clinical Laboratory Improvement Amendments CLIA as qualified to perform high complexity clinical laboratory testing.    CEFEPIME  Value in next row Sensitive      2 SENSITIVEThis is a modified FDA-approved test that has been validated and its performance characteristics determined by the reporting laboratory.  This laboratory is certified under the Clinical Laboratory Improvement Amendments CLIA as qualified to perform high complexity clinical laboratory testing.    ERTAPENEM Value in next  row Sensitive      2 SENSITIVEThis is a modified FDA-approved test that has been validated and its performance characteristics determined by the reporting laboratory.  This laboratory is certified under the Clinical Laboratory Improvement Amendments CLIA as qualified to perform high complexity clinical laboratory testing.    CEFTRIAXONE  Value in next row Sensitive      2 SENSITIVEThis is a modified FDA-approved test that has been validated and its performance characteristics determined by the reporting laboratory.  This laboratory is certified under the Clinical Laboratory Improvement Amendments CLIA as qualified to perform high complexity clinical laboratory testing.    CIPROFLOXACIN Value in next row Sensitive      2 SENSITIVEThis is a modified FDA-approved test that has been validated and its performance characteristics determined by the reporting laboratory.  This laboratory is certified under the Clinical Laboratory Improvement Amendments CLIA as qualified to perform high complexity clinical laboratory testing.  GENTAMICIN Value in next row Sensitive      2 SENSITIVEThis is a modified FDA-approved test that has been validated and its performance characteristics determined by the reporting laboratory.  This laboratory is certified under the Clinical Laboratory Improvement Amendments CLIA as qualified to perform high complexity clinical laboratory testing.    NITROFURANTOIN Value in next row Sensitive      2 SENSITIVEThis is a modified FDA-approved test that has been validated and its performance characteristics determined by the reporting laboratory.  This laboratory is certified under the Clinical Laboratory Improvement Amendments CLIA as qualified to perform high complexity clinical laboratory testing.    TRIMETH/SULFA Value in next row Sensitive      2 SENSITIVEThis is a modified FDA-approved test that has been validated and its performance characteristics determined by the reporting laboratory.   This laboratory is certified under the Clinical Laboratory Improvement Amendments CLIA as qualified to perform high complexity clinical laboratory testing.    AMPICILLIN/SULBACTAM Value in next row Sensitive      2 SENSITIVEThis is a modified FDA-approved test that has been validated and its performance characteristics determined by the reporting laboratory.  This laboratory is certified under the Clinical Laboratory Improvement Amendments CLIA as qualified to perform high complexity clinical laboratory testing.    PIP/TAZO Value in next row Sensitive ug/mL     <=4 SENSITIVEThis is a modified FDA-approved test that has been validated and its performance characteristics determined by the reporting laboratory.  This laboratory is certified under the Clinical Laboratory Improvement Amendments CLIA as qualified to perform high complexity clinical laboratory testing.    MEROPENEM Value in next row Sensitive      <=4 SENSITIVEThis is a modified FDA-approved test that has been validated and its performance characteristics determined by the reporting laboratory.  This laboratory is certified under the Clinical Laboratory Improvement Amendments CLIA as qualified to perform high complexity clinical laboratory testing.    * >=100,000 COLONIES/mL ESCHERICHIA COLI         Radiology Studies: No results found.      Scheduled Meds:  amLODipine   10 mg Oral Daily   ARIPiprazole   5 mg Oral Daily   aspirin  EC  81 mg Oral Daily   cephALEXin   250 mg Oral Q8H   docusate sodium   100 mg Oral BID   enoxaparin  (LOVENOX ) injection  30 mg Subcutaneous Q24H   gabapentin   300 mg Oral Q1200   rosuvastatin   40 mg Oral QPM   Continuous Infusions:   LOS: 1 day       Anthony CHRISTELLA Pouch, MD [facility] Pager [phone]  If 7PM-7AM, please contact night-coverage [email or web address] 12/04/2023, 8:33 AM

## 2023-12-04 NOTE — Plan of Care (Signed)
  Problem: Clinical Measurements: Goal: Ability to maintain clinical measurements within normal limits will improve Outcome: Progressing Goal: Will remain free from infection Outcome: Progressing Goal: Cardiovascular complication will be avoided Outcome: Progressing   Problem: Nutrition: Goal: Adequate nutrition will be maintained Outcome: Progressing   Problem: Coping: Goal: Level of anxiety will decrease Outcome: Progressing   Problem: Elimination: Goal: Will not experience complications related to urinary retention Outcome: Progressing   Problem: Pain Managment: Goal: General experience of comfort will improve and/or be controlled Outcome: Progressing

## 2023-12-05 DIAGNOSIS — E1129 Type 2 diabetes mellitus with other diabetic kidney complication: Secondary | ICD-10-CM | POA: Diagnosis not present

## 2023-12-05 DIAGNOSIS — F0390 Unspecified dementia without behavioral disturbance: Secondary | ICD-10-CM | POA: Diagnosis not present

## 2023-12-05 DIAGNOSIS — M6281 Muscle weakness (generalized): Secondary | ICD-10-CM | POA: Diagnosis not present

## 2023-12-05 DIAGNOSIS — E785 Hyperlipidemia, unspecified: Secondary | ICD-10-CM | POA: Diagnosis not present

## 2023-12-05 DIAGNOSIS — E119 Type 2 diabetes mellitus without complications: Secondary | ICD-10-CM | POA: Diagnosis not present

## 2023-12-05 DIAGNOSIS — R443 Hallucinations, unspecified: Secondary | ICD-10-CM | POA: Diagnosis not present

## 2023-12-05 DIAGNOSIS — Z743 Need for continuous supervision: Secondary | ICD-10-CM | POA: Diagnosis not present

## 2023-12-05 DIAGNOSIS — R488 Other symbolic dysfunctions: Secondary | ICD-10-CM | POA: Diagnosis not present

## 2023-12-05 DIAGNOSIS — R7989 Other specified abnormal findings of blood chemistry: Secondary | ICD-10-CM | POA: Diagnosis not present

## 2023-12-05 DIAGNOSIS — D649 Anemia, unspecified: Secondary | ICD-10-CM | POA: Diagnosis not present

## 2023-12-05 DIAGNOSIS — R41841 Cognitive communication deficit: Secondary | ICD-10-CM | POA: Diagnosis not present

## 2023-12-05 DIAGNOSIS — I10 Essential (primary) hypertension: Secondary | ICD-10-CM | POA: Diagnosis not present

## 2023-12-05 DIAGNOSIS — N390 Urinary tract infection, site not specified: Secondary | ICD-10-CM | POA: Diagnosis not present

## 2023-12-05 DIAGNOSIS — F329 Major depressive disorder, single episode, unspecified: Secondary | ICD-10-CM | POA: Diagnosis not present

## 2023-12-05 DIAGNOSIS — R262 Difficulty in walking, not elsewhere classified: Secondary | ICD-10-CM | POA: Diagnosis not present

## 2023-12-05 DIAGNOSIS — R2689 Other abnormalities of gait and mobility: Secondary | ICD-10-CM | POA: Diagnosis not present

## 2023-12-05 DIAGNOSIS — R609 Edema, unspecified: Secondary | ICD-10-CM | POA: Diagnosis not present

## 2023-12-05 DIAGNOSIS — N3000 Acute cystitis without hematuria: Secondary | ICD-10-CM | POA: Diagnosis not present

## 2023-12-05 LAB — BASIC METABOLIC PANEL WITH GFR
Anion gap: 9 (ref 5–15)
BUN: 29 mg/dL — ABNORMAL HIGH (ref 8–23)
CO2: 28 mmol/L (ref 22–32)
Calcium: 10 mg/dL (ref 8.9–10.3)
Chloride: 101 mmol/L (ref 98–111)
Creatinine, Ser: 1.49 mg/dL — ABNORMAL HIGH (ref 0.44–1.00)
GFR, Estimated: 35 mL/min — ABNORMAL LOW (ref >60)
Glucose, Bld: 148 mg/dL — ABNORMAL HIGH (ref 70–99)
Potassium: 3.3 mmol/L — ABNORMAL LOW (ref 3.5–5.1)
Sodium: 138 mmol/L (ref 135–145)

## 2023-12-05 MED ORDER — CEPHALEXIN 250 MG PO CAPS: 250.0000 mg | ORAL_CAPSULE | Freq: Three times a day (TID) | ORAL | 0 refills | Status: AC

## 2023-12-05 MED ORDER — ARIPIPRAZOLE 15 MG PO TABS: 7.5000 mg | ORAL_TABLET | Freq: Every day | ORAL | 0 refills | Status: DC

## 2023-12-05 NOTE — TOC Progression Note (Signed)
 Transition of Care [facility]) - Progression Note    Patient Details  Name: [name] MRN: [id number] Date of Birth: 05/10/1940  [facility] CM/SW Contact  Dalia GORMAN Fuse, RN Phone Number: 12/05/2023, 8:51 AM  Clinical Narrative:     Psych consulted for ongoing delirium and hallucinations, plan to add Abilify . Patient has a bed at [facility] when medically appropriate. Auth is valid 8/20-8/22.  Expected Discharge Plan: Skilled Nursing Facility Barriers to Discharge: Barriers Resolved               Expected Discharge Plan and Services       Living arrangements for the past 2 months: (P) Single Family Home                                       Social Drivers of Health (SDOH) Interventions SDOH Screenings   Food Insecurity: No Food Insecurity (11/30/2023)  Housing: Low Risk  (11/30/2023)  Transportation Needs: No Transportation Needs (11/30/2023)  Utilities: Not At Risk (11/30/2023)  Depression (PHQ2-9): High Risk (04/17/2023)  Social Connections: Socially Isolated (11/30/2023)  Tobacco Use: Low Risk  (11/30/2023)    Readmission Risk Interventions     No data to display

## 2023-12-05 NOTE — Discharge Summary (Addendum)
 Physician Discharge Summary  [name] FMW:[id number] DOB: 1940-10-07 DOA: 11/30/2023  PCP: Albina GORMAN Dine, MD  Admit date: 11/30/2023 Discharge date: 12/05/2023  Admitted From: home Disposition:  SNF  Recommendations for Outpatient Follow-up:  Follow up with PCP in 1-2 weeks   Home Health: no  Equipment/Devices:  Discharge Condition: stable  CODE STATUS: DNR Diet recommendation: \Carb Modified   Brief/Interim Summary: HPI was taken from Dr. Barbarann: [name] is a 83 y.o. female with medical history significant of dementia, stage 3b CKD, CAD, and CHF who presented on 8/17 with LE edema and concern for UTI. Patient is now able to explain about edema, denies urinary symptoms.  Also with L upper arm pain from my son was throwing me around all over the place.   I spoke with her son by telephone.  He reports that she is not formally diagnosed with dementia but has been told she does.  She was doing ok but in the last few days she was more confused.  Not getting up, going to restroom, concern for infection.  She has been ignoring him, talking to people who are not there/hallucinating.  The leg swelling is because he can't convince her to go to bed because there are snakes in the bed.  He can't get her to keep her legs elevated and so the edema won't resolve.  She has not complained of her arm hurting, did report head and leg pain.  She would rather die naturally if that were to happen.       ER Course:  Son reports change in MS.  Weak, hallucinating, more than baseline.  UTI, given Ceftriaxone .  +encephalopathy.  Discharge Diagnoses:  Principal Problem:   UTI (urinary tract infection) Active Problems:   Essential hypertension   Hyperlipidemia   Type II diabetes mellitus with renal manifestations (HCC)   Chronic diastolic CHF (congestive heart failure) (HCC)   CKD (chronic kidney disease) stage 3, GFR 30-59 ml/min (HCC)   Coronary artery disease involving native  coronary artery of native heart without angina pectoris   Dementia (HCC)   Bilateral lower extremity edema   Stasis dermatitis of both legs   DNR (do not resuscitate)  UTI: urine cx growing e. coli. Continue on keflex  until 12/07/23   Acute metabolic encephalopathy: superimposed on dementia. Likely secondary to above infection. Re-orient prn    LE Edema: w/ statis dermatitis. US  of b/l LE was neg for DVT. Continue w/ supportive care    CKDIIIb: Cr is labile. Avoid nephrotoxic meds   Hx of CAD: no chest pain. Continue on home dose of statin, aspirin     HTN: continue on home dose of amlodipine     DM2: well controlled, previous HbA1c 5.1. No need for SSI.   Peripheral neuropathy: continue on home dose of gabapentin     Chronic diastolic CHF: echo with preserved EF and grade 1 diastolic dysfunction. Appears compensated. Monitor I/Os. Restart home dose of lasix   Discharge Instructions  Discharge Instructions     Diet Carb Modified   Complete by: As directed    Discharge instructions   Complete by: As directed    F/u w/ PCP in 1-2 weeks   Discharge wound care:   Complete by: As directed    WOC will change bilat Una boots Q THURS   Increase activity slowly   Complete by: As directed       Allergies as of 12/05/2023       Reactions   Codeine Nausea  And Vomiting   Makes me deathly sick        Medication List     TAKE these medications    amLODipine  10 MG tablet Commonly known as: NORVASC  TAKE 1 TABLET(10 MG) BY MOUTH DAILY   ARIPiprazole  15 MG tablet Commonly known as: ABILIFY  Take 0.5 tablets (7.5 mg total) by mouth daily. Start taking on: December 06, 2023   aspirin  EC 81 MG tablet Take 81 mg by mouth daily. Swallow whole.   cephALEXin  250 MG capsule Commonly known as: KEFLEX  Take 1 capsule (250 mg total) by mouth every 8 (eight) hours for 2 days.   cholecalciferol  25 MCG (1000 UNIT) tablet Commonly known as: VITAMIN D3 Take 1 tablet by mouth 2 (two)  times daily.   citalopram  20 MG tablet Commonly known as: CeleXA  Take 1 tablet (20 mg total) by mouth daily.   feeding supplement Liqd Take 237 mLs by mouth 2 (two) times daily between meals.   furosemide  20 MG tablet Commonly known as: LASIX  TAKE 1 TABLET(20 MG) BY MOUTH DAILY AS NEEDED   gabapentin  300 MG capsule Commonly known as: Neurontin  Take 1 capsule (300 mg total) by mouth daily in the afternoon.   pioglitazone  15 MG tablet Commonly known as: ACTOS  TAKE 1 TABLET BY MOUTH EVERY MORNING   rosuvastatin  40 MG tablet Commonly known as: CRESTOR  TAKE 1 TABLET BY MOUTH EVERY DAY   sertraline  50 MG tablet Commonly known as: ZOLOFT  Take 50 mg by mouth daily.   VITAMIN C  PO Take 1 tablet by mouth daily.               Discharge Care Instructions  (From admission, onward)           Start     Ordered   12/05/23 0000  Discharge wound care:       Comments: WOC will change bilat Una boots Q THURS   12/05/23 1304            Contact information for follow-up providers     Albina GORMAN Dine, MD Follow up.   Specialty: Internal Medicine Why: hospital follow up Contact information: [address] [place] [address] [phone]              Contact information for after-discharge care     Destination     [facility] .   Service: Skilled Nursing Contact information: [address] [place]  [address] [phone]                    Allergies  Allergen Reactions   Codeine Nausea And Vomiting    Makes me deathly sick    Consultations: Psych    Procedures/Studies: CT Head Wo Contrast Result Date: 11/30/2023 CLINICAL DATA:  Delirium, lower extremity edema EXAM: CT HEAD WITHOUT CONTRAST TECHNIQUE: Contiguous axial images were obtained from the base of the skull through the vertex without intravenous contrast. RADIATION DOSE REDUCTION: This exam was performed according to the departmental  dose-optimization program which includes automated exposure control, adjustment of the mA and/or kV according to patient size and/or use of iterative reconstruction technique. COMPARISON:  09/04/2023 FINDINGS: Brain: Stable scattered hypodensities within the periventricular white matter and basal ganglia, consistent with chronic small vessel ischemic changes. No evidence of acute infarct or hemorrhage. Lateral ventricles and remaining midline structures are unremarkable. No acute extra-axial fluid collections. No mass effect. Vascular: No hyperdense vessel or unexpected calcification. Stable atherosclerosis. Skull: Normal. Negative for fracture or  focal lesion. Sinuses/Orbits: Polypoid mucosal thickening left maxillary sinus. Remaining paranasal sinuses are clear. Other: None. IMPRESSION: 1. Stable head CT, no acute intracranial process. Electronically Signed   By: Ozell Daring M.D.   On: 11/30/2023 17:08   US  Venous Img Lower Bilateral Result Date: 11/30/2023 CLINICAL DATA:  Lower extremity edema. EXAM: BILATERAL LOWER EXTREMITY VENOUS DOPPLER ULTRASOUND TECHNIQUE: Gray-scale sonography with compression, as well as color and duplex ultrasound, were performed to evaluate the deep venous system(s) from the level of the common femoral vein through the popliteal and proximal calf veins. COMPARISON:  None Available. FINDINGS: VENOUS Normal compressibility of the common femoral, superficial femoral, and popliteal veins, as well as the visualized calf veins. Visualized portions of profunda femoral vein and great saphenous vein unremarkable. No filling defects to suggest DVT on grayscale or color Doppler imaging. Doppler waveforms show normal direction of venous flow, normal respiratory plasticity and response to augmentation. OTHER Subcutaneous edema noted in the calves. Limitations: none IMPRESSION: No evidence of DVT in either lower extremity. Electronically Signed   By: Andrea Gasman M.D.   On: 11/30/2023 16:48    DG Chest Portable 1 View Result Date: 11/30/2023 CLINICAL DATA:  Edema. EXAM: PORTABLE CHEST 1 VIEW COMPARISON:  09/27/2023 FINDINGS: Prior median sternotomy. The heart is enlarged. Diffuse pulmonary edema. No focal airspace disease. No pleural fluid or pneumothorax. No acute osseous findings. IMPRESSION: Cardiomegaly with pulmonary edema. Electronically Signed   By: Andrea Gasman M.D.   On: 11/30/2023 15:50   (Echo, Carotid, EGD, Colonoscopy, ERCP)    Subjective: Pt c/o fatigue   Discharge Exam: Vitals:   12/05/23 0450 12/05/23 0739  BP: 134/69 (!) 119/58  Pulse: 74 98  Resp: 16 16  Temp: 98.2 F (36.8 C) 98.6 F (37 C)  SpO2: 95% 96%   Vitals:   12/04/23 1538 12/04/23 2154 12/05/23 0450 12/05/23 0739  BP: 123/62 128/63 134/69 (!) 119/58  Pulse: 99 81 74 98  Resp: 18 19 16 16   Temp: 98.7 F (37.1 C) 99.3 F (37.4 C) 98.2 F (36.8 C) 98.6 F (37 C)  TempSrc:  Oral    SpO2: 97% 96% 95% 96%  Weight:      Height:        General: Pt is alert, awake, not in acute distress Cardiovascular: S1/S2 +, no rubs, no gallops Respiratory: CTA bilaterally, no wheezing, no rhonchi Abdominal: Soft, NT, ND, bowel sounds + Extremities: no cyanosis    The results of significant diagnostics from this hospitalization (including imaging, microbiology, ancillary and laboratory) are listed below for reference.     Microbiology: Recent Results (from the past 240 hours)  Urine Culture     Status: Abnormal   Collection Time: 11/30/23  1:45 PM   Specimen: Urine, Random  Result Value Ref Range Status   Specimen Description   Final    URINE, RANDOM Performed at [facility], [address] [place] [address] [place], [place] [address]    Special Requests   Final    NONE Reflexed from [id number] Performed at [facility], [address] [place], [place] [address]    Culture >=100,000 COLONIES/mL ESCHERICHIA COLI (A)  Final   Report Status 12/02/2023 FINAL  Final   Organism ID,  Bacteria ESCHERICHIA COLI (A)  Final      Susceptibility   Escherichia coli - MIC*    AMPICILLIN 4 SENSITIVE Sensitive     CEFAZOLIN  (URINE) Value in next row Sensitive      2 SENSITIVEThis is  a modified FDA-approved test that has been validated and its performance characteristics determined by the reporting laboratory.  This laboratory is certified under the Clinical Laboratory Improvement Amendments CLIA as qualified to perform high complexity clinical laboratory testing.    CEFEPIME  Value in next row Sensitive      2 SENSITIVEThis is a modified FDA-approved test that has been validated and its performance characteristics determined by the reporting laboratory.  This laboratory is certified under the Clinical Laboratory Improvement Amendments CLIA as qualified to perform high complexity clinical laboratory testing.    ERTAPENEM Value in next row Sensitive      2 SENSITIVEThis is a modified FDA-approved test that has been validated and its performance characteristics determined by the reporting laboratory.  This laboratory is certified under the Clinical Laboratory Improvement Amendments CLIA as qualified to perform high complexity clinical laboratory testing.    CEFTRIAXONE  Value in next row Sensitive      2 SENSITIVEThis is a modified FDA-approved test that has been validated and its performance characteristics determined by the reporting laboratory.  This laboratory is certified under the Clinical Laboratory Improvement Amendments CLIA as qualified to perform high complexity clinical laboratory testing.    CIPROFLOXACIN Value in next row Sensitive      2 SENSITIVEThis is a modified FDA-approved test that has been validated and its performance characteristics determined by the reporting laboratory.  This laboratory is certified under the Clinical Laboratory Improvement Amendments CLIA as qualified to perform high complexity clinical laboratory testing.    GENTAMICIN Value in next row Sensitive       2 SENSITIVEThis is a modified FDA-approved test that has been validated and its performance characteristics determined by the reporting laboratory.  This laboratory is certified under the Clinical Laboratory Improvement Amendments CLIA as qualified to perform high complexity clinical laboratory testing.    NITROFURANTOIN Value in next row Sensitive      2 SENSITIVEThis is a modified FDA-approved test that has been validated and its performance characteristics determined by the reporting laboratory.  This laboratory is certified under the Clinical Laboratory Improvement Amendments CLIA as qualified to perform high complexity clinical laboratory testing.    TRIMETH/SULFA Value in next row Sensitive      2 SENSITIVEThis is a modified FDA-approved test that has been validated and its performance characteristics determined by the reporting laboratory.  This laboratory is certified under the Clinical Laboratory Improvement Amendments CLIA as qualified to perform high complexity clinical laboratory testing.    AMPICILLIN/SULBACTAM Value in next row Sensitive      2 SENSITIVEThis is a modified FDA-approved test that has been validated and its performance characteristics determined by the reporting laboratory.  This laboratory is certified under the Clinical Laboratory Improvement Amendments CLIA as qualified to perform high complexity clinical laboratory testing.    PIP/TAZO Value in next row Sensitive ug/mL     <=4 SENSITIVEThis is a modified FDA-approved test that has been validated and its performance characteristics determined by the reporting laboratory.  This laboratory is certified under the Clinical Laboratory Improvement Amendments CLIA as qualified to perform high complexity clinical laboratory testing.    MEROPENEM Value in next row Sensitive      <=4 SENSITIVEThis is a modified FDA-approved test that has been validated and its performance characteristics determined by the reporting laboratory.  This  laboratory is certified under the Clinical Laboratory Improvement Amendments CLIA as qualified to perform high complexity clinical laboratory testing.    * >=100,000 COLONIES/mL ESCHERICHIA COLI  Labs: BNP (last 3 results) Recent Labs    07/03/23 1727 11/30/23 1354  BNP 461.0* 126.3*   Basic Metabolic Panel: Recent Labs  Lab 11/30/23 1354 12/01/23 0455 12/02/23 0459 12/04/23 0922 12/05/23 1000  NA 141 141 139 137 138  K 3.3* 3.2* 3.4* 3.3* 3.3*  CL 104 105 103 101 101  CO2 23 26 25 24 28   GLUCOSE 72 81 120* 110* 148*  BUN 30* 32* 27* 23 29*  CREATININE 1.62* 1.65* 1.40* 1.47* 1.49*  CALCIUM  10.9* 10.0 10.0 10.5* 10.0   Liver Function Tests: No results for input(s): AST, ALT, ALKPHOS, BILITOT, PROT, ALBUMIN in the last 168 hours. No results for input(s): LIPASE, AMYLASE in the last 168 hours. No results for input(s): AMMONIA in the last 168 hours. CBC: Recent Labs  Lab 11/30/23 1354 12/01/23 0455 12/02/23 0459  WBC 6.5 5.9 6.9  NEUTROABS 3.6  --  4.6  HGB 10.1* 9.6* 9.9*  HCT 31.9* 29.4* 30.1*  MCV 86.2 83.8 84.3  PLT 200 191 187   Cardiac Enzymes: No results for input(s): CKTOTAL, CKMB, CKMBINDEX, TROPONINI in the last 168 hours. BNP: Invalid input(s): POCBNP CBG: No results for input(s): GLUCAP in the last 168 hours. D-Dimer No results for input(s): DDIMER in the last 72 hours. Hgb A1c No results for input(s): HGBA1C in the last 72 hours. Lipid Profile No results for input(s): CHOL, HDL, LDLCALC, TRIG, CHOLHDL, LDLDIRECT in the last 72 hours. Thyroid  function studies No results for input(s): TSH, T4TOTAL, T3FREE, THYROIDAB in the last 72 hours.  Invalid input(s): FREET3 Anemia work up No results for input(s): VITAMINB12, FOLATE, FERRITIN, TIBC, IRON , RETICCTPCT in the last 72 hours. Urinalysis    Component Value Date/Time   COLORURINE YELLOW (A) 11/30/2023 1345   APPEARANCEUR  CLOUDY (A) 11/30/2023 1345   LABSPEC 1.009 11/30/2023 1345   PHURINE 6.0 11/30/2023 1345   GLUCOSEU NEGATIVE 11/30/2023 1345   HGBUR NEGATIVE 11/30/2023 1345   BILIRUBINUR NEGATIVE 11/30/2023 1345   BILIRUBINUR Negative 04/11/2023 1147   KETONESUR NEGATIVE 11/30/2023 1345   PROTEINUR NEGATIVE 11/30/2023 1345   UROBILINOGEN negative (A) 04/11/2023 1147   UROBILINOGEN 0.2 05/01/2014 0634   NITRITE POSITIVE (A) 11/30/2023 1345   LEUKOCYTESUR LARGE (A) 11/30/2023 1345   Sepsis Labs Recent Labs  Lab 11/30/23 1354 12/01/23 0455 12/02/23 0459  WBC 6.5 5.9 6.9   Microbiology Recent Results (from the past 240 hours)  Urine Culture     Status: Abnormal   Collection Time: 11/30/23  1:45 PM   Specimen: Urine, Random  Result Value Ref Range Status   Specimen Description   Final    URINE, RANDOM Performed at [facility], [address] [place], [place] [address]    Special Requests   Final    NONE Reflexed from [id number] Performed at [facility], [address] [place], [place] [address]    Culture >=100,000 COLONIES/mL ESCHERICHIA COLI (A)  Final   Report Status 12/02/2023 FINAL  Final   Organism ID, Bacteria ESCHERICHIA COLI (A)  Final      Susceptibility   Escherichia coli - MIC*    AMPICILLIN 4 SENSITIVE Sensitive     CEFAZOLIN  (URINE) Value in next row Sensitive      2 SENSITIVEThis is a modified FDA-approved test that has been validated and its performance characteristics determined by the reporting laboratory.  This laboratory is certified under the Clinical Laboratory Improvement Amendments CLIA as qualified to perform high complexity clinical laboratory testing.  CEFEPIME  Value in next row Sensitive      2 SENSITIVEThis is a modified FDA-approved test that has been validated and its performance characteristics determined by the reporting laboratory.  This laboratory is certified under the Clinical Laboratory Improvement Amendments CLIA as qualified to  perform high complexity clinical laboratory testing.    ERTAPENEM Value in next row Sensitive      2 SENSITIVEThis is a modified FDA-approved test that has been validated and its performance characteristics determined by the reporting laboratory.  This laboratory is certified under the Clinical Laboratory Improvement Amendments CLIA as qualified to perform high complexity clinical laboratory testing.    CEFTRIAXONE  Value in next row Sensitive      2 SENSITIVEThis is a modified FDA-approved test that has been validated and its performance characteristics determined by the reporting laboratory.  This laboratory is certified under the Clinical Laboratory Improvement Amendments CLIA as qualified to perform high complexity clinical laboratory testing.    CIPROFLOXACIN Value in next row Sensitive      2 SENSITIVEThis is a modified FDA-approved test that has been validated and its performance characteristics determined by the reporting laboratory.  This laboratory is certified under the Clinical Laboratory Improvement Amendments CLIA as qualified to perform high complexity clinical laboratory testing.    GENTAMICIN Value in next row Sensitive      2 SENSITIVEThis is a modified FDA-approved test that has been validated and its performance characteristics determined by the reporting laboratory.  This laboratory is certified under the Clinical Laboratory Improvement Amendments CLIA as qualified to perform high complexity clinical laboratory testing.    NITROFURANTOIN Value in next row Sensitive      2 SENSITIVEThis is a modified FDA-approved test that has been validated and its performance characteristics determined by the reporting laboratory.  This laboratory is certified under the Clinical Laboratory Improvement Amendments CLIA as qualified to perform high complexity clinical laboratory testing.    TRIMETH/SULFA Value in next row Sensitive      2 SENSITIVEThis is a modified FDA-approved test that has been  validated and its performance characteristics determined by the reporting laboratory.  This laboratory is certified under the Clinical Laboratory Improvement Amendments CLIA as qualified to perform high complexity clinical laboratory testing.    AMPICILLIN/SULBACTAM Value in next row Sensitive      2 SENSITIVEThis is a modified FDA-approved test that has been validated and its performance characteristics determined by the reporting laboratory.  This laboratory is certified under the Clinical Laboratory Improvement Amendments CLIA as qualified to perform high complexity clinical laboratory testing.    PIP/TAZO Value in next row Sensitive ug/mL     <=4 SENSITIVEThis is a modified FDA-approved test that has been validated and its performance characteristics determined by the reporting laboratory.  This laboratory is certified under the Clinical Laboratory Improvement Amendments CLIA as qualified to perform high complexity clinical laboratory testing.    MEROPENEM Value in next row Sensitive      <=4 SENSITIVEThis is a modified FDA-approved test that has been validated and its performance characteristics determined by the reporting laboratory.  This laboratory is certified under the Clinical Laboratory Improvement Amendments CLIA as qualified to perform high complexity clinical laboratory testing.    * >=100,000 COLONIES/mL ESCHERICHIA COLI     Time coordinating discharge: 32 minutes  SIGNED:   Anthony CHRISTELLA Pouch, MD  [facility] 12/05/2023, 1:04 PM Pager   If 7PM-7AM, please contact night-coverage [email or web address]

## 2023-12-05 NOTE — TOC Transition Note (Signed)
 Transition of Care [facility]) - Discharge Note   Patient Details  Name: ANU STAGNER MRN: [id number] Date of Birth: 1940/11/03  [facility]) CM/SW Contact:  Dalia GORMAN Fuse, RN Phone Number: 12/05/2023, 1:44 PM   Clinical Narrative:    Patient is medically clear to [facility]. [facility] will transport.    Final next level of care: Skilled Nursing Facility Barriers to Discharge: Barriers Resolved   Patient Goals and CMS Choice     Choice offered to / list presented to : (P) Patient      Discharge Placement              Patient chooses bed at: [facility]   Name of family member notified: Norleen Goldberg Patient and family notified of of transfer: 12/05/23  Discharge Plan and Services Additional resources added to the After Visit Summary for                                       Social Drivers of Health (SDOH) Interventions SDOH Screenings   Food Insecurity: No Food Insecurity (11/30/2023)  Housing: Low Risk  (11/30/2023)  Transportation Needs: No Transportation Needs (11/30/2023)  Utilities: Not At Risk (11/30/2023)  Depression (PHQ2-9): High Risk (04/17/2023)  Social Connections: Socially Isolated (11/30/2023)  Tobacco Use: Low Risk  (11/30/2023)     Readmission Risk Interventions     No data to display

## 2023-12-05 NOTE — Plan of Care (Signed)
 Patient remains free from any noted signs of acute distress.  Has not required any additional medical interventions.  Patient to continue to be monitored by hospital staff until discharge.

## 2023-12-08 DIAGNOSIS — R262 Difficulty in walking, not elsewhere classified: Secondary | ICD-10-CM | POA: Diagnosis not present

## 2023-12-08 DIAGNOSIS — M6281 Muscle weakness (generalized): Secondary | ICD-10-CM | POA: Diagnosis not present

## 2023-12-08 DIAGNOSIS — I10 Essential (primary) hypertension: Secondary | ICD-10-CM | POA: Diagnosis not present

## 2023-12-08 DIAGNOSIS — E119 Type 2 diabetes mellitus without complications: Secondary | ICD-10-CM | POA: Diagnosis not present

## 2023-12-09 DIAGNOSIS — I10 Essential (primary) hypertension: Secondary | ICD-10-CM | POA: Diagnosis not present

## 2023-12-09 DIAGNOSIS — R7989 Other specified abnormal findings of blood chemistry: Secondary | ICD-10-CM | POA: Diagnosis not present

## 2023-12-09 DIAGNOSIS — D649 Anemia, unspecified: Secondary | ICD-10-CM | POA: Diagnosis not present

## 2023-12-09 DIAGNOSIS — R609 Edema, unspecified: Secondary | ICD-10-CM | POA: Diagnosis not present

## 2023-12-09 DIAGNOSIS — F0390 Unspecified dementia without behavioral disturbance: Secondary | ICD-10-CM | POA: Diagnosis not present

## 2023-12-09 DIAGNOSIS — N390 Urinary tract infection, site not specified: Secondary | ICD-10-CM | POA: Diagnosis not present

## 2023-12-09 DIAGNOSIS — F329 Major depressive disorder, single episode, unspecified: Secondary | ICD-10-CM | POA: Diagnosis not present

## 2023-12-09 DIAGNOSIS — R443 Hallucinations, unspecified: Secondary | ICD-10-CM | POA: Diagnosis not present

## 2023-12-09 DIAGNOSIS — E785 Hyperlipidemia, unspecified: Secondary | ICD-10-CM | POA: Diagnosis not present

## 2023-12-10 DIAGNOSIS — I10 Essential (primary) hypertension: Secondary | ICD-10-CM | POA: Diagnosis not present

## 2023-12-10 DIAGNOSIS — N390 Urinary tract infection, site not specified: Secondary | ICD-10-CM | POA: Diagnosis not present

## 2023-12-10 DIAGNOSIS — F0390 Unspecified dementia without behavioral disturbance: Secondary | ICD-10-CM | POA: Diagnosis not present

## 2023-12-10 DIAGNOSIS — R2689 Other abnormalities of gait and mobility: Secondary | ICD-10-CM | POA: Diagnosis not present

## 2023-12-10 DIAGNOSIS — R609 Edema, unspecified: Secondary | ICD-10-CM | POA: Diagnosis not present

## 2023-12-10 DIAGNOSIS — R443 Hallucinations, unspecified: Secondary | ICD-10-CM | POA: Diagnosis not present

## 2023-12-10 DIAGNOSIS — E119 Type 2 diabetes mellitus without complications: Secondary | ICD-10-CM | POA: Diagnosis not present

## 2023-12-10 DIAGNOSIS — R262 Difficulty in walking, not elsewhere classified: Secondary | ICD-10-CM | POA: Diagnosis not present

## 2023-12-11 DIAGNOSIS — F0390 Unspecified dementia without behavioral disturbance: Secondary | ICD-10-CM | POA: Diagnosis not present

## 2023-12-11 DIAGNOSIS — R609 Edema, unspecified: Secondary | ICD-10-CM | POA: Diagnosis not present

## 2023-12-12 DIAGNOSIS — F0390 Unspecified dementia without behavioral disturbance: Secondary | ICD-10-CM | POA: Diagnosis not present

## 2023-12-12 DIAGNOSIS — R2689 Other abnormalities of gait and mobility: Secondary | ICD-10-CM | POA: Diagnosis not present

## 2023-12-12 DIAGNOSIS — R262 Difficulty in walking, not elsewhere classified: Secondary | ICD-10-CM | POA: Diagnosis not present

## 2023-12-12 DIAGNOSIS — R609 Edema, unspecified: Secondary | ICD-10-CM | POA: Diagnosis not present

## 2023-12-12 DIAGNOSIS — N390 Urinary tract infection, site not specified: Secondary | ICD-10-CM | POA: Diagnosis not present

## 2023-12-12 DIAGNOSIS — E119 Type 2 diabetes mellitus without complications: Secondary | ICD-10-CM | POA: Diagnosis not present

## 2023-12-14 ENCOUNTER — Other Ambulatory Visit: Payer: Self-pay | Admitting: Internal Medicine

## 2023-12-14 DIAGNOSIS — E782 Mixed hyperlipidemia: Secondary | ICD-10-CM

## 2023-12-16 DIAGNOSIS — E119 Type 2 diabetes mellitus without complications: Secondary | ICD-10-CM | POA: Diagnosis not present

## 2023-12-16 DIAGNOSIS — F0390 Unspecified dementia without behavioral disturbance: Secondary | ICD-10-CM | POA: Diagnosis not present

## 2023-12-16 DIAGNOSIS — I10 Essential (primary) hypertension: Secondary | ICD-10-CM | POA: Diagnosis not present

## 2023-12-16 DIAGNOSIS — R609 Edema, unspecified: Secondary | ICD-10-CM | POA: Diagnosis not present

## 2023-12-17 DIAGNOSIS — R262 Difficulty in walking, not elsewhere classified: Secondary | ICD-10-CM | POA: Diagnosis not present

## 2023-12-17 DIAGNOSIS — E119 Type 2 diabetes mellitus without complications: Secondary | ICD-10-CM | POA: Diagnosis not present

## 2023-12-17 DIAGNOSIS — N390 Urinary tract infection, site not specified: Secondary | ICD-10-CM | POA: Diagnosis not present

## 2023-12-17 DIAGNOSIS — R2689 Other abnormalities of gait and mobility: Secondary | ICD-10-CM | POA: Diagnosis not present

## 2023-12-19 DIAGNOSIS — N390 Urinary tract infection, site not specified: Secondary | ICD-10-CM | POA: Diagnosis not present

## 2023-12-19 DIAGNOSIS — R262 Difficulty in walking, not elsewhere classified: Secondary | ICD-10-CM | POA: Diagnosis not present

## 2023-12-19 DIAGNOSIS — E119 Type 2 diabetes mellitus without complications: Secondary | ICD-10-CM | POA: Diagnosis not present

## 2023-12-19 DIAGNOSIS — R2689 Other abnormalities of gait and mobility: Secondary | ICD-10-CM | POA: Diagnosis not present

## 2023-12-22 DIAGNOSIS — R2689 Other abnormalities of gait and mobility: Secondary | ICD-10-CM | POA: Diagnosis not present

## 2023-12-22 DIAGNOSIS — R262 Difficulty in walking, not elsewhere classified: Secondary | ICD-10-CM | POA: Diagnosis not present

## 2023-12-22 DIAGNOSIS — E119 Type 2 diabetes mellitus without complications: Secondary | ICD-10-CM | POA: Diagnosis not present

## 2023-12-22 DIAGNOSIS — N390 Urinary tract infection, site not specified: Secondary | ICD-10-CM | POA: Diagnosis not present

## 2023-12-26 DIAGNOSIS — I5020 Unspecified systolic (congestive) heart failure: Secondary | ICD-10-CM | POA: Diagnosis not present

## 2023-12-26 DIAGNOSIS — N390 Urinary tract infection, site not specified: Secondary | ICD-10-CM | POA: Diagnosis not present

## 2023-12-26 DIAGNOSIS — I130 Hypertensive heart and chronic kidney disease with heart failure and stage 1 through stage 4 chronic kidney disease, or unspecified chronic kidney disease: Secondary | ICD-10-CM | POA: Diagnosis not present

## 2023-12-26 DIAGNOSIS — F0282 Dementia in other diseases classified elsewhere, unspecified severity, with psychotic disturbance: Secondary | ICD-10-CM | POA: Diagnosis not present

## 2023-12-26 DIAGNOSIS — F0283 Dementia in other diseases classified elsewhere, unspecified severity, with mood disturbance: Secondary | ICD-10-CM | POA: Diagnosis not present

## 2023-12-26 DIAGNOSIS — G309 Alzheimer's disease, unspecified: Secondary | ICD-10-CM | POA: Diagnosis not present

## 2023-12-26 DIAGNOSIS — F319 Bipolar disorder, unspecified: Secondary | ICD-10-CM | POA: Diagnosis not present

## 2023-12-26 DIAGNOSIS — N1832 Chronic kidney disease, stage 3b: Secondary | ICD-10-CM | POA: Diagnosis not present

## 2023-12-26 DIAGNOSIS — E1122 Type 2 diabetes mellitus with diabetic chronic kidney disease: Secondary | ICD-10-CM | POA: Diagnosis not present

## 2023-12-26 DIAGNOSIS — E1136 Type 2 diabetes mellitus with diabetic cataract: Secondary | ICD-10-CM | POA: Diagnosis not present

## 2023-12-30 ENCOUNTER — Ambulatory Visit (INDEPENDENT_AMBULATORY_CARE_PROVIDER_SITE_OTHER): Admitting: Internal Medicine

## 2023-12-30 VITALS — BP 126/62 | HR 93 | Temp 97.7°F | Ht 64.0 in | Wt 143.4 lb

## 2023-12-30 DIAGNOSIS — F28 Other psychotic disorder not due to a substance or known physiological condition: Secondary | ICD-10-CM | POA: Diagnosis not present

## 2023-12-30 DIAGNOSIS — Z0130 Encounter for examination of blood pressure without abnormal findings: Secondary | ICD-10-CM

## 2023-12-30 DIAGNOSIS — F03B18 Unspecified dementia, moderate, with other behavioral disturbance: Secondary | ICD-10-CM | POA: Diagnosis not present

## 2023-12-30 DIAGNOSIS — E782 Mixed hyperlipidemia: Secondary | ICD-10-CM

## 2023-12-30 DIAGNOSIS — R051 Acute cough: Secondary | ICD-10-CM | POA: Diagnosis not present

## 2023-12-30 DIAGNOSIS — G5603 Carpal tunnel syndrome, bilateral upper limbs: Secondary | ICD-10-CM

## 2023-12-30 MED ORDER — ARIPIPRAZOLE 15 MG PO TABS: 7.5000 mg | ORAL_TABLET | Freq: Every day | ORAL | 1 refills | Status: DC

## 2023-12-30 MED ORDER — GABAPENTIN 300 MG PO CAPS: 300.0000 mg | ORAL_CAPSULE | Freq: Every day | ORAL | 0 refills | Status: AC

## 2023-12-30 MED ORDER — ROSUVASTATIN CALCIUM 40 MG PO TABS: 40.0000 mg | ORAL_TABLET | Freq: Every day | ORAL | 0 refills | Status: DC

## 2023-12-30 MED ORDER — CITALOPRAM HYDROBROMIDE 20 MG PO TABS: 20.0000 mg | ORAL_TABLET | Freq: Every day | ORAL | 1 refills | Status: DC

## 2023-12-30 NOTE — Progress Notes (Signed)
 Established Patient Office Visit  Subjective:  Patient ID: [name], female    DOB: [birth year]  Age: 83 y.o. MRN: [id number]  Chief Complaint  Patient presents with   Follow-up    Follow up    C/o cough x several weeks which is unproductive and started after her last hospitalization. Denies fever or chills here for lab review and medication refills. Lives at home but her son stays with her most of the day.    No other concerns at this time.   Past Medical History:  Diagnosis Date   Alzheimer dementia (HCC)    unknown stage   Arthritis    hands, feet, knees, shoulders (05/05/2014)   Bipolar disorder (HCC)    Cataracts, bilateral    immature   Chronic kidney disease    dr says they aren't functioning like they should (05/05/2014)   Coronary artery disease    Depression    takes Zoloft  daily   History of bronchitis 69yrs ago   Hyperlipidemia    takes Atorvastatin  daily   Hypertension    takes Metoprolol ,Lisinopril ,and Apresoline  daily   Joint pain    Peripheral edema    takes Chlothalidone daily   Squamous cell cancer of skin of nose 2012   right   Squamous cell cancer of skin of nose    Type II diabetes mellitus (HCC)    takes Tradjenta  daily    Past Surgical History:  Procedure Laterality Date   COLONOSCOPY WITH PROPOFOL  N/A 02/03/2018   Procedure: COLONOSCOPY WITH PROPOFOL ;  Surgeon: Gaylyn Gladis PENNER, MD;  Location: [facility] ENDOSCOPY;  Service: Endoscopy;  Laterality: N/A;   CORONARY ARTERY BYPASS GRAFT  05/12/2007   CABG X1; at [facility]   coronary artery bypass with arterial grafts     DILATION AND CURETTAGE OF UTERUS  2005   FRACTURE SURGERY     I & D EXTREMITY Right 04/30/2014   Procedure: IRRIGATION AND DEBRIDEMENT EXTREMITY;  Surgeon: Norleen Armor, MD;  Location: [facility] OR;  Service: Orthopedics;  Laterality: Right;   INCISION AND DRAINAGE Right 08/11/2014   Procedure: REMOVAL OF DEEP IMPLANTS OF RIGHT TIBIAL FIBIAL INCISION AND DRAINAGE OF RIGHT  ANKLE WOUND;  Surgeon: Norleen Armor, MD;  Location: [facility] OR;  Service: Orthopedics;  Laterality: Right;   ORIF ANKLE FRACTURE Right 04/30/2014   Procedure: OPEN REDUCTION INTERNAL FIXATION (ORIF) ANKLE FRACTURE;  Surgeon: Norleen Armor, MD;  Location: [facility] OR;  Service: Orthopedics;  Laterality: Right;   REMOVAL OF IMPLANT Right 08/11/2014   Procedure: REMOVAL OF DEEP IMPLANTS OF RIGHT TIBIAL FIBIAL INCISION AND DRAINAGE OF RIGHT ANKLE WOUND;  Surgeon: Norleen Armor, MD;  Location: [facility] OR;  Service: Orthopedics;  Laterality: Right;   SQUAMOUS CELL CARCINOMA EXCISION Right 2012   nose   VAGINAL HYSTERECTOMY  2007    Social History   Socioeconomic History   Marital status: Widowed    Spouse name: Not on file   Number of children: Not on file   Years of education: Not on file   Highest education level: Not on file  Occupational History   Not on file  Tobacco Use   Smoking status: Never   Smokeless tobacco: Never  Vaping Use   Vaping status: Never Used  Substance and Sexual Activity   Alcohol use: No    Alcohol/week: 0.0 standard drinks of alcohol   Drug use: No   Sexual activity: Not on file  Other Topics Concern   Not on file  Social History Narrative  Not on file   Social Drivers of Health   Financial Resource Strain: Not on file  Food Insecurity: No Food Insecurity (11/30/2023)   Hunger Vital Sign    Worried About Running Out of Food in the Last Year: Never true    Ran Out of Food in the Last Year: Never true  Transportation Needs: No Transportation Needs (11/30/2023)   PRAPARE - Administrator, Civil Service (Medical): No    Lack of Transportation (Non-Medical): No  Physical Activity: Not on file  Stress: Not on file  Social Connections: Socially Isolated (11/30/2023)   Social Connection and Isolation Panel    Frequency of Communication with Friends and Family: Three times a week    Frequency of Social Gatherings with Friends and Family: Three times a week     Attends Religious Services: Never    Active Member of Clubs or Organizations: No    Attends Banker Meetings: Never    Marital Status: Widowed  Intimate Partner Violence: Not At Risk (11/30/2023)   Humiliation, Afraid, Rape, and Kick questionnaire    Fear of Current or Ex-Partner: No    Emotionally Abused: No    Physically Abused: No    Sexually Abused: No    Family History  Problem Relation Age of Onset   Heart disease Sister     Allergies  Allergen Reactions   Codeine Nausea And Vomiting    Makes me deathly sick    Outpatient Medications Prior to Visit  Medication Sig   amLODipine  (NORVASC ) 10 MG tablet TAKE 1 TABLET(10 MG) BY MOUTH DAILY   Ascorbic Acid  (VITAMIN C  PO) Take 1 tablet by mouth daily.   aspirin  EC 81 MG tablet Take 81 mg by mouth daily. Swallow whole.   cholecalciferol  (VITAMIN D3) 25 MCG (1000 UNIT) tablet Take 1 tablet by mouth 2 (two) times daily.   feeding supplement (ENSURE ENLIVE / ENSURE PLUS) LIQD Take 237 mLs by mouth 2 (two) times daily between meals.   furosemide  (LASIX ) 20 MG tablet TAKE 1 TABLET(20 MG) BY MOUTH DAILY AS NEEDED   pioglitazone  (ACTOS ) 15 MG tablet TAKE 1 TABLET BY MOUTH EVERY MORNING   sertraline  (ZOLOFT ) 50 MG tablet Take 50 mg by mouth daily.   [DISCONTINUED] ARIPiprazole  (ABILIFY ) 15 MG tablet Take 0.5 tablets (7.5 mg total) by mouth daily.   [DISCONTINUED] citalopram  (CELEXA ) 20 MG tablet Take 1 tablet (20 mg total) by mouth daily.   [DISCONTINUED] gabapentin  (NEURONTIN ) 300 MG capsule Take 1 capsule (300 mg total) by mouth daily in the afternoon.   [DISCONTINUED] rosuvastatin  (CRESTOR ) 40 MG tablet TAKE 1 TABLET BY MOUTH EVERY DAY   No facility-administered medications prior to visit.    Review of Systems  Constitutional: Negative.   HENT: Negative.    Eyes: Negative.   Respiratory: Negative.    Cardiovascular: Negative.   Gastrointestinal: Negative.   Genitourinary: Negative.   Skin: Negative.    Neurological: Negative.   Endo/Heme/Allergies: Negative.        Objective:   BP 126/62   Pulse 93   Temp 97.7 F (36.5 C)   Ht 5' 4 (1.626 m)   Wt 143 lb 6.4 oz (65 kg)   SpO2 97%   BMI 24.61 kg/m   Vitals:   12/30/23 1340  BP: 126/62  Pulse: 93  Temp: 97.7 F (36.5 C)  Height: 5' 4 (1.626 m)  Weight: 143 lb 6.4 oz (65 kg)  SpO2: 97%  BMI (Calculated): 24.6  Physical Exam Vitals reviewed.  Constitutional:      General: She is not in acute distress.    Appearance: She is obese.  HENT:     Head: Normocephalic.     Nose: Nose normal.     Mouth/Throat:     Mouth: Mucous membranes are moist.  Eyes:     Extraocular Movements: Extraocular movements intact.     Pupils: Pupils are equal, round, and reactive to light.  Cardiovascular:     Rate and Rhythm: Normal rate and regular rhythm.     Heart sounds: No murmur heard. Pulmonary:     Effort: Pulmonary effort is normal.     Breath sounds: No rhonchi or rales.  Abdominal:     General: Abdomen is flat.     Palpations: There is no hepatomegaly, splenomegaly or mass.  Musculoskeletal:        General: Normal range of motion.     Cervical back: Normal range of motion. No tenderness.  Skin:    General: Skin is warm and dry.  Neurological:     General: No focal deficit present.     Mental Status: She is alert and oriented to person, place, and time.     Cranial Nerves: No cranial nerve deficit.     Motor: No weakness.  Psychiatric:        Mood and Affect: Mood normal.        Behavior: Behavior normal.      No results found for any visits on 12/30/23.  Recent Results (from the past 2160 hours)  POC CBG, ED     Status: None   Collection Time: 10/02/23  8:01 AM  Result Value Ref Range   Glucose-Capillary 90 70 - 99 mg/dL    Comment: Glucose reference range applies only to samples taken after fasting for at least 8 hours.  POC CBG, ED     Status: None   Collection Time: 10/02/23 11:24 AM  Result Value  Ref Range   Glucose-Capillary 90 70 - 99 mg/dL    Comment: Glucose reference range applies only to samples taken after fasting for at least 8 hours.   Comment 1 Notify RN    Comment 2 Document in Chart   POC CBG, ED     Status: Abnormal   Collection Time: 10/02/23  4:32 PM  Result Value Ref Range   Glucose-Capillary 152 (H) 70 - 99 mg/dL    Comment: Glucose reference range applies only to samples taken after fasting for at least 8 hours.  CBG monitoring, ED     Status: None   Collection Time: 10/03/23  2:20 AM  Result Value Ref Range   Glucose-Capillary 99 70 - 99 mg/dL    Comment: Glucose reference range applies only to samples taken after fasting for at least 8 hours.  CBG monitoring, ED     Status: None   Collection Time: 10/03/23  7:34 AM  Result Value Ref Range   Glucose-Capillary 95 70 - 99 mg/dL    Comment: Glucose reference range applies only to samples taken after fasting for at least 8 hours.  CBG monitoring, ED     Status: Abnormal   Collection Time: 10/03/23 11:26 AM  Result Value Ref Range   Glucose-Capillary 126 (H) 70 - 99 mg/dL    Comment: Glucose reference range applies only to samples taken after fasting for at least 8 hours.  CBG monitoring, ED     Status: Abnormal   Collection Time: 10/03/23  4:43 PM  Result Value Ref Range   Glucose-Capillary 120 (H) 70 - 99 mg/dL    Comment: Glucose reference range applies only to samples taken after fasting for at least 8 hours.  CBG monitoring, ED     Status: None   Collection Time: 10/03/23 10:05 PM  Result Value Ref Range   Glucose-Capillary 91 70 - 99 mg/dL    Comment: Glucose reference range applies only to samples taken after fasting for at least 8 hours.  CBG monitoring, ED     Status: None   Collection Time: 10/04/23  7:52 AM  Result Value Ref Range   Glucose-Capillary 86 70 - 99 mg/dL    Comment: Glucose reference range applies only to samples taken after fasting for at least 8 hours.  CBG monitoring, ED      Status: Abnormal   Collection Time: 10/04/23 11:32 AM  Result Value Ref Range   Glucose-Capillary 192 (H) 70 - 99 mg/dL    Comment: Glucose reference range applies only to samples taken after fasting for at least 8 hours.  CBG monitoring, ED     Status: Abnormal   Collection Time: 10/04/23  5:04 PM  Result Value Ref Range   Glucose-Capillary 112 (H) 70 - 99 mg/dL    Comment: Glucose reference range applies only to samples taken after fasting for at least 8 hours.  CBG monitoring, ED     Status: None   Collection Time: 10/04/23 10:39 PM  Result Value Ref Range   Glucose-Capillary 96 70 - 99 mg/dL    Comment: Glucose reference range applies only to samples taken after fasting for at least 8 hours.  CBG monitoring, ED     Status: Abnormal   Collection Time: 10/05/23  8:41 AM  Result Value Ref Range   Glucose-Capillary 151 (H) 70 - 99 mg/dL    Comment: Glucose reference range applies only to samples taken after fasting for at least 8 hours.  CBG monitoring, ED     Status: Abnormal   Collection Time: 10/05/23 12:40 PM  Result Value Ref Range   Glucose-Capillary 152 (H) 70 - 99 mg/dL    Comment: Glucose reference range applies only to samples taken after fasting for at least 8 hours.   Comment 1 Notify RN    Comment 2 Document in Chart   CBG monitoring, ED     Status: Abnormal   Collection Time: 10/05/23  4:15 PM  Result Value Ref Range   Glucose-Capillary 112 (H) 70 - 99 mg/dL    Comment: Glucose reference range applies only to samples taken after fasting for at least 8 hours.   Comment 1 Notify RN    Comment 2 Document in Chart   Urinalysis, w/ Reflex to Culture (Infection Suspected) -Urine, Clean Catch     Status: Abnormal   Collection Time: 11/30/23  1:45 PM  Result Value Ref Range   Specimen Source URINE, CLEAN CATCH    Color, Urine YELLOW (A) YELLOW   APPearance CLOUDY (A) CLEAR   Specific Gravity, Urine 1.009 1.005 - 1.030   pH 6.0 5.0 - 8.0   Glucose, UA NEGATIVE NEGATIVE  mg/dL   Hgb urine dipstick NEGATIVE NEGATIVE   Bilirubin Urine NEGATIVE NEGATIVE   Ketones, ur NEGATIVE NEGATIVE mg/dL   Protein, ur NEGATIVE NEGATIVE mg/dL   Nitrite POSITIVE (A) NEGATIVE   Leukocytes,Ua LARGE (A) NEGATIVE   RBC / HPF 0-5 0 - 5 RBC/hpf   WBC, UA >50 0 - 5 WBC/hpf  Comment:        Reflex urine culture not performed if WBC <=10, OR if Squamous epithelial cells >5. If Squamous epithelial cells >5 suggest recollection.    Bacteria, UA MANY (A) NONE SEEN   Squamous Epithelial / HPF 0-5 0 - 5 /HPF   WBC Clumps PRESENT    Mucus PRESENT    Hyaline Casts, UA PRESENT     Comment: Performed at [facility], [address] [place], [place] [address]  Urine Culture     Status: Abnormal   Collection Time: 11/30/23  1:45 PM   Specimen: Urine, Random  Result Value Ref Range   Specimen Description      URINE, RANDOM Performed at [facility], [address] [place], [place] [address]    Special Requests      NONE Reflexed from [id number] Performed at [facility], [address] [place], [place] [address]    Culture >=100,000 COLONIES/mL ESCHERICHIA COLI (A)    Report Status 12/02/2023 FINAL    Organism ID, Bacteria ESCHERICHIA COLI (A)       Susceptibility   Escherichia coli - MIC*    AMPICILLIN 4 SENSITIVE Sensitive     CEFAZOLIN  (URINE) Value in next row Sensitive      2 SENSITIVEThis is a modified FDA-approved test that has been validated and its performance characteristics determined by the reporting laboratory.  This laboratory is certified under the Clinical Laboratory Improvement Amendments CLIA as qualified to perform high complexity clinical laboratory testing.    CEFEPIME  Value in next row Sensitive      2 SENSITIVEThis is a modified FDA-approved test that has been validated and its performance characteristics determined by the reporting laboratory.  This laboratory is certified under the Clinical Laboratory Improvement Amendments  CLIA as qualified to perform high complexity clinical laboratory testing.    ERTAPENEM Value in next row Sensitive      2 SENSITIVEThis is a modified FDA-approved test that has been validated and its performance characteristics determined by the reporting laboratory.  This laboratory is certified under the Clinical Laboratory Improvement Amendments CLIA as qualified to perform high complexity clinical laboratory testing.    CEFTRIAXONE  Value in next row Sensitive      2 SENSITIVEThis is a modified FDA-approved test that has been validated and its performance characteristics determined by the reporting laboratory.  This laboratory is certified under the Clinical Laboratory Improvement Amendments CLIA as qualified to perform high complexity clinical laboratory testing.    CIPROFLOXACIN Value in next row Sensitive      2 SENSITIVEThis is a modified FDA-approved test that has been validated and its performance characteristics determined by the reporting laboratory.  This laboratory is certified under the Clinical Laboratory Improvement Amendments CLIA as qualified to perform high complexity clinical laboratory testing.    GENTAMICIN Value in next row Sensitive      2 SENSITIVEThis is a modified FDA-approved test that has been validated and its performance characteristics determined by the reporting laboratory.  This laboratory is certified under the Clinical Laboratory Improvement Amendments CLIA as qualified to perform high complexity clinical laboratory testing.    NITROFURANTOIN Value in next row Sensitive      2 SENSITIVEThis is a modified FDA-approved test that has been validated and its performance characteristics determined by the reporting laboratory.  This laboratory is certified under the Clinical Laboratory Improvement Amendments CLIA as qualified to perform high complexity clinical laboratory testing.    TRIMETH/SULFA Value in next row  Sensitive      2 SENSITIVEThis is a modified FDA-approved  test that has been validated and its performance characteristics determined by the reporting laboratory.  This laboratory is certified under the Clinical Laboratory Improvement Amendments CLIA as qualified to perform high complexity clinical laboratory testing.    AMPICILLIN/SULBACTAM Value in next row Sensitive      2 SENSITIVEThis is a modified FDA-approved test that has been validated and its performance characteristics determined by the reporting laboratory.  This laboratory is certified under the Clinical Laboratory Improvement Amendments CLIA as qualified to perform high complexity clinical laboratory testing.    PIP/TAZO Value in next row Sensitive ug/mL     <=4 SENSITIVEThis is a modified FDA-approved test that has been validated and its performance characteristics determined by the reporting laboratory.  This laboratory is certified under the Clinical Laboratory Improvement Amendments CLIA as qualified to perform high complexity clinical laboratory testing.    MEROPENEM Value in next row Sensitive      <=4 SENSITIVEThis is a modified FDA-approved test that has been validated and its performance characteristics determined by the reporting laboratory.  This laboratory is certified under the Clinical Laboratory Improvement Amendments CLIA as qualified to perform high complexity clinical laboratory testing.    * >=100,000 COLONIES/mL ESCHERICHIA COLI  Basic metabolic panel     Status: Abnormal   Collection Time: 11/30/23  1:54 PM  Result Value Ref Range   Sodium 141 135 - 145 mmol/L   Potassium 3.3 (L) 3.5 - 5.1 mmol/L   Chloride 104 98 - 111 mmol/L   CO2 23 22 - 32 mmol/L   Glucose, Bld 72 70 - 99 mg/dL    Comment: Glucose reference range applies only to samples taken after fasting for at least 8 hours.   BUN 30 (H) 8 - 23 mg/dL   Creatinine, Ser 8.37 (H) 0.44 - 1.00 mg/dL   Calcium  10.9 (H) 8.9 - 10.3 mg/dL   GFR, Estimated 31 (L) >60 mL/min    Comment: (NOTE) Calculated using the  CKD-EPI Creatinine Equation (2021)    Anion gap 14 5 - 15    Comment: Performed at [facility], [address] [place], [place] [address]  Lactic acid, plasma     Status: None   Collection Time: 11/30/23  1:54 PM  Result Value Ref Range   Lactic Acid, Venous 0.9 0.5 - 1.9 mmol/L    Comment: Performed at [facility], [address] [place], [place] [address]  CBC with Differential     Status: Abnormal   Collection Time: 11/30/23  1:54 PM  Result Value Ref Range   WBC 6.5 4.0 - 10.5 K/uL   RBC 3.70 (L) 3.87 - 5.11 MIL/uL   Hemoglobin 10.1 (L) 12.0 - 15.0 g/dL   HCT 68.0 (L) 63.9 - 53.9 %   MCV 86.2 80.0 - 100.0 fL   MCH 27.3 26.0 - 34.0 pg   MCHC 31.7 30.0 - 36.0 g/dL   RDW 84.7 88.4 - 84.4 %   Platelets 200 150 - 400 K/uL   nRBC 0.0 0.0 - 0.2 %   Neutrophils Relative % 54 %   Neutro Abs 3.6 1.7 - 7.7 K/uL   Lymphocytes Relative 34 %   Lymphs Abs 2.2 0.7 - 4.0 K/uL   Monocytes Relative 9 %   Monocytes Absolute 0.6 0.1 - 1.0 K/uL   Eosinophils Relative 2 %   Eosinophils Absolute 0.1 0.0 - 0.5 K/uL   Basophils Relative 1 %  Basophils Absolute 0.0 0.0 - 0.1 K/uL   Immature Granulocytes 0 %   Abs Immature Granulocytes 0.02 0.00 - 0.07 K/uL    Comment: Performed at [facility], [address] [place], [place] [address]  Brain natriuretic peptide     Status: Abnormal   Collection Time: 11/30/23  1:54 PM  Result Value Ref Range   B Natriuretic Peptide 126.3 (H) 0.0 - 100.0 pg/mL    Comment: Performed at [facility], [address] [place], [place] [address]  Basic metabolic panel     Status: Abnormal   Collection Time: 12/01/23  4:55 AM  Result Value Ref Range   Sodium 141 135 - 145 mmol/L   Potassium 3.2 (L) 3.5 - 5.1 mmol/L   Chloride 105 98 - 111 mmol/L   CO2 26 22 - 32 mmol/L   Glucose, Bld 81 70 - 99 mg/dL    Comment: Glucose reference range applies only to samples taken after fasting for at least 8 hours.   BUN 32 (H) 8 -  23 mg/dL   Creatinine, Ser 8.34 (H) 0.44 - 1.00 mg/dL   Calcium  10.0 8.9 - 10.3 mg/dL   GFR, Estimated 31 (L) >60 mL/min    Comment: (NOTE) Calculated using the CKD-EPI Creatinine Equation (2021)    Anion gap 10 5 - 15    Comment: Performed at [facility], [address] [place], [place] [address]  CBC     Status: Abnormal   Collection Time: 12/01/23  4:55 AM  Result Value Ref Range   WBC 5.9 4.0 - 10.5 K/uL   RBC 3.51 (L) 3.87 - 5.11 MIL/uL   Hemoglobin 9.6 (L) 12.0 - 15.0 g/dL   HCT 70.5 (L) 63.9 - 53.9 %   MCV 83.8 80.0 - 100.0 fL   MCH 27.4 26.0 - 34.0 pg   MCHC 32.7 30.0 - 36.0 g/dL   RDW 84.9 88.4 - 84.4 %   Platelets 191 150 - 400 K/uL   nRBC 0.0 0.0 - 0.2 %    Comment: Performed at [facility], [address] [place], [place] [address]  CBC with Differential/Platelet     Status: Abnormal   Collection Time: 12/02/23  4:59 AM  Result Value Ref Range   WBC 6.9 4.0 - 10.5 K/uL   RBC 3.57 (L) 3.87 - 5.11 MIL/uL   Hemoglobin 9.9 (L) 12.0 - 15.0 g/dL   HCT 69.8 (L) 63.9 - 53.9 %   MCV 84.3 80.0 - 100.0 fL   MCH 27.7 26.0 - 34.0 pg   MCHC 32.9 30.0 - 36.0 g/dL   RDW 85.1 88.4 - 84.4 %   Platelets 187 150 - 400 K/uL   nRBC 0.0 0.0 - 0.2 %   Neutrophils Relative % 66 %   Neutro Abs 4.6 1.7 - 7.7 K/uL   Lymphocytes Relative 22 %   Lymphs Abs 1.5 0.7 - 4.0 K/uL   Monocytes Relative 12 %   Monocytes Absolute 0.8 0.1 - 1.0 K/uL   Eosinophils Relative 0 %   Eosinophils Absolute 0.0 0.0 - 0.5 K/uL   Basophils Relative 0 %   Basophils Absolute 0.0 0.0 - 0.1 K/uL   Immature Granulocytes 0 %   Abs Immature Granulocytes 0.03 0.00 - 0.07 K/uL    Comment: Performed at [facility], [address] [place], [place] [address]  Basic metabolic panel with GFR     Status: Abnormal   Collection Time: 12/02/23  4:59 AM  Result Value Ref Range   Sodium 139 135 - 145 mmol/L   Potassium 3.4 (L) 3.5 - 5.1 mmol/L   Chloride 103 98 - 111 mmol/L   CO2 25  22 - 32 mmol/L   Glucose, Bld 120 (H) 70 - 99 mg/dL    Comment: Glucose reference range applies only to samples taken after fasting for at least 8 hours.   BUN 27 (H) 8 - 23 mg/dL   Creatinine, Ser 8.59 (H) 0.44 - 1.00 mg/dL   Calcium  10.0 8.9 - 10.3 mg/dL   GFR, Estimated 37 (L) >60 mL/min    Comment: (NOTE) Calculated using the CKD-EPI Creatinine Equation (2021)    Anion gap 11 5 - 15    Comment: Performed at [facility], [address] [place], [place] [address]  Basic metabolic panel     Status: Abnormal   Collection Time: 12/04/23  9:22 AM  Result Value Ref Range   Sodium 137 135 - 145 mmol/L   Potassium 3.3 (L) 3.5 - 5.1 mmol/L   Chloride 101 98 - 111 mmol/L   CO2 24 22 - 32 mmol/L   Glucose, Bld 110 (H) 70 - 99 mg/dL    Comment: Glucose reference range applies only to samples taken after fasting for at least 8 hours.   BUN 23 8 - 23 mg/dL   Creatinine, Ser 8.52 (H) 0.44 - 1.00 mg/dL   Calcium  10.5 (H) 8.9 - 10.3 mg/dL   GFR, Estimated 35 (L) >60 mL/min    Comment: (NOTE) Calculated using the CKD-EPI Creatinine Equation (2021)    Anion gap 12 5 - 15    Comment: Performed at [facility], [address] [place], [place] [address]  Basic metabolic panel     Status: Abnormal   Collection Time: 12/05/23 10:00 AM  Result Value Ref Range   Sodium 138 135 - 145 mmol/L   Potassium 3.3 (L) 3.5 - 5.1 mmol/L   Chloride 101 98 - 111 mmol/L   CO2 28 22 - 32 mmol/L   Glucose, Bld 148 (H) 70 - 99 mg/dL    Comment: Glucose reference range applies only to samples taken after fasting for at least 8 hours.   BUN 29 (H) 8 - 23 mg/dL   Creatinine, Ser 8.50 (H) 0.44 - 1.00 mg/dL   Calcium  10.0 8.9 - 10.3 mg/dL   GFR, Estimated 35 (L) >60 mL/min    Comment: (NOTE) Calculated using the CKD-EPI Creatinine Equation (2021)    Anion gap 9 5 - 15    Comment: Performed at [facility], [address] [place], [place] [address]      Assessment & Plan:    Problem List Items Addressed This Visit       Nervous and Auditory   Bilateral carpal tunnel syndrome   Relevant Medications   gabapentin  (NEURONTIN ) 300 MG capsule   citalopram  (CELEXA ) 20 MG tablet   ARIPiprazole  (ABILIFY ) 15 MG tablet   Dementia (HCC) - Primary   Relevant Medications   gabapentin  (NEURONTIN ) 300 MG capsule   citalopram  (CELEXA ) 20 MG tablet   ARIPiprazole  (ABILIFY ) 15 MG tablet   Other Relevant Orders   Ambulatory referral to Psychiatry     Other   Hyperlipidemia (Chronic)   Relevant Medications   rosuvastatin  (CRESTOR ) 40 MG tablet   Other Visit Diagnoses       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   Total time spent: 20 minutes  Sherrill Cinderella Perry, MD  12/30/2023   This document may have been prepared by [facility] Voice Recognition software and as such may include unintentional dictation errors.

## 2024-01-25 ENCOUNTER — Other Ambulatory Visit: Payer: Self-pay | Admitting: Internal Medicine

## 2024-01-25 DIAGNOSIS — E782 Mixed hyperlipidemia: Secondary | ICD-10-CM

## 2024-01-29 ENCOUNTER — Other Ambulatory Visit: Payer: Self-pay

## 2024-01-30 MED ORDER — SERTRALINE HCL 50 MG PO TABS: 50.0000 mg | ORAL_TABLET | Freq: Every day | ORAL | 1 refills | Status: DC

## 2024-02-18 ENCOUNTER — Other Ambulatory Visit: Payer: Self-pay | Admitting: Internal Medicine

## 2024-02-18 DIAGNOSIS — E782 Mixed hyperlipidemia: Secondary | ICD-10-CM

## 2024-02-19 DIAGNOSIS — F0282 Dementia in other diseases classified elsewhere, unspecified severity, with psychotic disturbance: Secondary | ICD-10-CM | POA: Diagnosis not present

## 2024-02-19 DIAGNOSIS — I130 Hypertensive heart and chronic kidney disease with heart failure and stage 1 through stage 4 chronic kidney disease, or unspecified chronic kidney disease: Secondary | ICD-10-CM | POA: Diagnosis not present

## 2024-02-19 DIAGNOSIS — F319 Bipolar disorder, unspecified: Secondary | ICD-10-CM | POA: Diagnosis not present

## 2024-02-19 DIAGNOSIS — G309 Alzheimer's disease, unspecified: Secondary | ICD-10-CM | POA: Diagnosis not present

## 2024-02-19 DIAGNOSIS — E1122 Type 2 diabetes mellitus with diabetic chronic kidney disease: Secondary | ICD-10-CM | POA: Diagnosis not present

## 2024-02-19 DIAGNOSIS — I5020 Unspecified systolic (congestive) heart failure: Secondary | ICD-10-CM | POA: Diagnosis not present

## 2024-02-19 DIAGNOSIS — E1136 Type 2 diabetes mellitus with diabetic cataract: Secondary | ICD-10-CM | POA: Diagnosis not present

## 2024-02-19 DIAGNOSIS — N1832 Chronic kidney disease, stage 3b: Secondary | ICD-10-CM | POA: Diagnosis not present

## 2024-02-19 DIAGNOSIS — F0283 Dementia in other diseases classified elsewhere, unspecified severity, with mood disturbance: Secondary | ICD-10-CM | POA: Diagnosis not present

## 2024-02-26 NOTE — Progress Notes (Unsigned)
   02/26/2024  Patient ID: [name], female   DOB: [birth year], 83 y.o.   MRN: [id number]  Pharmacy Quality Measure Review  This patient is appearing on a report for being at risk of failing the adherence measure for cholesterol (statin) medications this calendar year.   Medication: Rosuvastatin  Last fill date: 02/19/24 for 30 day supply  Insurance report was not up to date. No action needed at this time.   Jon VEAR Lindau, PharmD Clinical Pharmacist [phone]

## 2024-03-01 ENCOUNTER — Ambulatory Visit: Payer: Self-pay | Admitting: Internal Medicine

## 2024-03-01 ENCOUNTER — Ambulatory Visit (INDEPENDENT_AMBULATORY_CARE_PROVIDER_SITE_OTHER): Admitting: Internal Medicine

## 2024-03-01 VITALS — BP 142/62 | HR 75 | Temp 97.8°F | Ht 64.0 in | Wt 137.4 lb

## 2024-03-01 DIAGNOSIS — F03B18 Unspecified dementia, moderate, with other behavioral disturbance: Secondary | ICD-10-CM

## 2024-03-01 DIAGNOSIS — E782 Mixed hyperlipidemia: Secondary | ICD-10-CM | POA: Diagnosis not present

## 2024-03-01 DIAGNOSIS — I10 Essential (primary) hypertension: Secondary | ICD-10-CM | POA: Diagnosis not present

## 2024-03-01 DIAGNOSIS — E1165 Type 2 diabetes mellitus with hyperglycemia: Secondary | ICD-10-CM | POA: Diagnosis not present

## 2024-03-01 DIAGNOSIS — D2330 Other benign neoplasm of skin of unspecified part of face: Secondary | ICD-10-CM

## 2024-03-01 DIAGNOSIS — Z739 Problem related to life management difficulty, unspecified: Secondary | ICD-10-CM

## 2024-03-01 DIAGNOSIS — Z0001 Encounter for general adult medical examination with abnormal findings: Secondary | ICD-10-CM | POA: Diagnosis not present

## 2024-03-01 DIAGNOSIS — F32A Depression, unspecified: Secondary | ICD-10-CM

## 2024-03-01 DIAGNOSIS — Z23 Encounter for immunization: Secondary | ICD-10-CM

## 2024-03-01 DIAGNOSIS — Z1331 Encounter for screening for depression: Secondary | ICD-10-CM

## 2024-03-01 DIAGNOSIS — E119 Type 2 diabetes mellitus without complications: Secondary | ICD-10-CM

## 2024-03-01 DIAGNOSIS — N1832 Chronic kidney disease, stage 3b: Secondary | ICD-10-CM

## 2024-03-01 LAB — POCT URINALYSIS DIPSTICK
Bilirubin, UA: NEGATIVE
Blood, UA: NEGATIVE
Glucose, UA: NEGATIVE
Ketones, UA: NEGATIVE
Leukocytes, UA: NEGATIVE
Nitrite, UA: NEGATIVE
Protein, UA: NEGATIVE
Spec Grav, UA: 1.015 (ref 1.010–1.025)
Urobilinogen, UA: 0.2 U/dL
pH, UA: 6 (ref 5.0–8.0)

## 2024-03-01 MED ORDER — PIOGLITAZONE HCL 15 MG PO TABS: 15.0000 mg | ORAL_TABLET | Freq: Every morning | ORAL | 1 refills | Status: AC

## 2024-03-01 MED ORDER — ROSUVASTATIN CALCIUM 40 MG PO TABS
40.0000 mg | ORAL_TABLET | Freq: Every evening | ORAL | 0 refills | Status: AC
Start: 2024-03-01 — End: 2024-05-30

## 2024-03-01 NOTE — Progress Notes (Signed)
 Established Patient Office Visit  Subjective:  Patient ID: [name], female    DOB: 22-Oct-1940  Age: 83 y.o. MRN: [id number]  Chief Complaint  Patient presents with   Annual Exam    2 month AWV lab results    No new complaints, here for AWV refer to quality metrics and scanned documents. Cognitive impairment limits history and ROS. Seen by home nurse last week who thought she had symptoms of a UTI. Paranoia has improved on current meds.     No other concerns at this time.   Past Medical History:  Diagnosis Date   Alzheimer dementia (HCC)    unknown stage   Arthritis    hands, feet, knees, shoulders (05/05/2014)   Bipolar disorder (HCC)    Cataracts, bilateral    immature   Chronic kidney disease    dr says they aren't functioning like they should (05/05/2014)   Coronary artery disease    Depression    takes Zoloft  daily   History of bronchitis 70yrs ago   Hyperlipidemia    takes Atorvastatin  daily   Hypertension    takes Metoprolol ,Lisinopril ,and Apresoline  daily   Joint pain    Peripheral edema    takes Chlothalidone daily   Squamous cell cancer of skin of nose 2012   right   Squamous cell cancer of skin of nose    Type II diabetes mellitus (HCC)    takes Tradjenta  daily    Past Surgical History:  Procedure Laterality Date   COLONOSCOPY WITH PROPOFOL  N/A 02/03/2018   Procedure: COLONOSCOPY WITH PROPOFOL ;  Surgeon: Gaylyn Gladis PENNER, MD;  Location: [facility] ENDOSCOPY;  Service: Endoscopy;  Laterality: N/A;   CORONARY ARTERY BYPASS GRAFT  05/12/2007   CABG X1; at [facility]   coronary artery bypass with arterial grafts     DILATION AND CURETTAGE OF UTERUS  2005   FRACTURE SURGERY     I & D EXTREMITY Right 04/30/2014   Procedure: IRRIGATION AND DEBRIDEMENT EXTREMITY;  Surgeon: Norleen Armor, MD;  Location: [facility] OR;  Service: Orthopedics;  Laterality: Right;   INCISION AND DRAINAGE Right 08/11/2014   Procedure: REMOVAL OF DEEP IMPLANTS OF RIGHT TIBIAL FIBIAL  INCISION AND DRAINAGE OF RIGHT ANKLE WOUND;  Surgeon: Norleen Armor, MD;  Location: [facility] OR;  Service: Orthopedics;  Laterality: Right;   ORIF ANKLE FRACTURE Right 04/30/2014   Procedure: OPEN REDUCTION INTERNAL FIXATION (ORIF) ANKLE FRACTURE;  Surgeon: Norleen Armor, MD;  Location: [facility] OR;  Service: Orthopedics;  Laterality: Right;   REMOVAL OF IMPLANT Right 08/11/2014   Procedure: REMOVAL OF DEEP IMPLANTS OF RIGHT TIBIAL FIBIAL INCISION AND DRAINAGE OF RIGHT ANKLE WOUND;  Surgeon: Norleen Armor, MD;  Location: [facility] OR;  Service: Orthopedics;  Laterality: Right;   SQUAMOUS CELL CARCINOMA EXCISION Right 2012   nose   VAGINAL HYSTERECTOMY  2007    Social History   Socioeconomic History   Marital status: Widowed    Spouse name: Not on file   Number of children: Not on file   Years of education: Not on file   Highest education level: Not on file  Occupational History   Not on file  Tobacco Use   Smoking status: Never   Smokeless tobacco: Never  Vaping Use   Vaping status: Never Used  Substance and Sexual Activity   Alcohol use: No    Alcohol/week: 0.0 standard drinks of alcohol   Drug use: No   Sexual activity: Not on file  Other Topics Concern   Not  on file  Social History Narrative   Not on file   Social Drivers of Health   Financial Resource Strain: Not on file  Food Insecurity: No Food Insecurity (11/30/2023)   Hunger Vital Sign    Worried About Running Out of Food in the Last Year: Never true    Ran Out of Food in the Last Year: Never true  Transportation Needs: No Transportation Needs (11/30/2023)   PRAPARE - Administrator, Civil Service (Medical): No    Lack of Transportation (Non-Medical): No  Physical Activity: Not on file  Stress: Not on file  Social Connections: Socially Isolated (11/30/2023)   Social Connection and Isolation Panel    Frequency of Communication with Friends and Family: Three times a week    Frequency of Social Gatherings with Friends and  Family: Three times a week    Attends Religious Services: Never    Active Member of Clubs or Organizations: No    Attends Banker Meetings: Never    Marital Status: Widowed  Intimate Partner Violence: Not At Risk (11/30/2023)   Humiliation, Afraid, Rape, and Kick questionnaire    Fear of Current or Ex-Partner: No    Emotionally Abused: No    Physically Abused: No    Sexually Abused: No    Family History  Problem Relation Age of Onset   Heart disease Sister     Allergies  Allergen Reactions   Codeine Nausea And Vomiting    Makes me deathly sick    Outpatient Medications Prior to Visit  Medication Sig   amLODipine  (NORVASC ) 10 MG tablet TAKE 1 TABLET(10 MG) BY MOUTH DAILY   ARIPiprazole  (ABILIFY ) 15 MG tablet Take 0.5 tablets (7.5 mg total) by mouth daily.   Ascorbic Acid  (VITAMIN C  PO) Take 1 tablet by mouth daily.   aspirin  EC 81 MG tablet Take 81 mg by mouth daily. Swallow whole.   cholecalciferol  (VITAMIN D3) 25 MCG (1000 UNIT) tablet Take 1 tablet by mouth 2 (two) times daily.   citalopram  (CELEXA ) 20 MG tablet Take 1 tablet (20 mg total) by mouth daily.   feeding supplement (ENSURE ENLIVE / ENSURE PLUS) LIQD Take 237 mLs by mouth 2 (two) times daily between meals.   furosemide  (LASIX ) 20 MG tablet TAKE 1 TABLET(20 MG) BY MOUTH DAILY AS NEEDED   gabapentin  (NEURONTIN ) 300 MG capsule Take 1 capsule (300 mg total) by mouth daily in the afternoon.   sertraline  (ZOLOFT ) 50 MG tablet Take 1 tablet (50 mg total) by mouth daily.   [DISCONTINUED] pioglitazone  (ACTOS ) 15 MG tablet TAKE 1 TABLET BY MOUTH EVERY MORNING   [DISCONTINUED] rosuvastatin  (CRESTOR ) 40 MG tablet TAKE 1 TABLET(40 MG) BY MOUTH DAILY   No facility-administered medications prior to visit.    Review of Systems  Constitutional:  Positive for weight loss (6 lbs).  HENT: Negative.    Eyes: Negative.   Respiratory: Negative.    Cardiovascular: Negative.   Gastrointestinal: Negative.    Genitourinary: Negative.   Skin: Negative.   Neurological: Negative.   Endo/Heme/Allergies: Negative.        Objective:   BP (!) 142/62   Pulse 75   Temp 97.8 F (36.6 C)   Ht 5' 4 (1.626 m)   Wt 137 lb 6.4 oz (62.3 kg)   SpO2 96%   BMI 23.58 kg/m   Vitals:   03/01/24 1327  BP: (!) 142/62  Pulse: 75  Temp: 97.8 F (36.6 C)  Height: 5' 4 (1.626  m)  Weight: 137 lb 6.4 oz (62.3 kg)  SpO2: 96%  BMI (Calculated): 23.57    Physical Exam Vitals reviewed.  Constitutional:      General: She is not in acute distress.    Appearance: She is obese.  HENT:     Head: Normocephalic.     Nose: Nose normal.     Mouth/Throat:     Mouth: Mucous membranes are moist.  Eyes:     Extraocular Movements: Extraocular movements intact.     Pupils: Pupils are equal, round, and reactive to light.  Cardiovascular:     Rate and Rhythm: Normal rate and regular rhythm.     Heart sounds: No murmur heard. Pulmonary:     Effort: Pulmonary effort is normal.     Breath sounds: No rhonchi or rales.  Abdominal:     General: Abdomen is flat.     Palpations: There is no hepatomegaly, splenomegaly or mass.  Musculoskeletal:        General: Normal range of motion.     Cervical back: Normal range of motion. No tenderness.  Skin:    General: Skin is warm and dry.  Neurological:     General: No focal deficit present.     Mental Status: She is alert and oriented to person, place, and time.     Cranial Nerves: No cranial nerve deficit.     Motor: No weakness.  Psychiatric:        Mood and Affect: Mood normal.        Behavior: Behavior normal.      Results for orders placed or performed in visit on 03/01/24  POCT Urinalysis Dipstick (81002)  Result Value Ref Range   Color, UA     Clarity, UA     Glucose, UA Negative Negative   Bilirubin, UA Negative    Ketones, UA Negative    Spec Grav, UA 1.015 1.010 - 1.025   Blood, UA Negative    pH, UA 6.0 5.0 - 8.0   Protein, UA Negative  Negative   Urobilinogen, UA 0.2 0.2 or 1.0 E.U./dL   Nitrite, UA Negative    Leukocytes, UA Negative Negative   Appearance     Odor      Recent Results (from the past 2160 hours)  Basic metabolic panel     Status: Abnormal   Collection Time: 12/04/23  9:22 AM  Result Value Ref Range   Sodium 137 135 - 145 mmol/L   Potassium 3.3 (L) 3.5 - 5.1 mmol/L   Chloride 101 98 - 111 mmol/L   CO2 24 22 - 32 mmol/L   Glucose, Bld 110 (H) 70 - 99 mg/dL    Comment: Glucose reference range applies only to samples taken after fasting for at least 8 hours.   BUN 23 8 - 23 mg/dL   Creatinine, Ser 8.52 (H) 0.44 - 1.00 mg/dL   Calcium  10.5 (H) 8.9 - 10.3 mg/dL   GFR, Estimated 35 (L) >60 mL/min    Comment: (NOTE) Calculated using the CKD-EPI Creatinine Equation (2021)    Anion gap 12 5 - 15    Comment: Performed at [facility], [address] [place], [place] [address]  Basic metabolic panel     Status: Abnormal   Collection Time: 12/05/23 10:00 AM  Result Value Ref Range   Sodium 138 135 - 145 mmol/L   Potassium 3.3 (L) 3.5 - 5.1 mmol/L   Chloride 101 98 - 111 mmol/L   CO2  28 22 - 32 mmol/L   Glucose, Bld 148 (H) 70 - 99 mg/dL    Comment: Glucose reference range applies only to samples taken after fasting for at least 8 hours.   BUN 29 (H) 8 - 23 mg/dL   Creatinine, Ser 8.50 (H) 0.44 - 1.00 mg/dL   Calcium  10.0 8.9 - 10.3 mg/dL   GFR, Estimated 35 (L) >60 mL/min    Comment: (NOTE) Calculated using the CKD-EPI Creatinine Equation (2021)    Anion gap 9 5 - 15    Comment: Performed at [facility], [address] [place], [place] [address]  POCT Urinalysis Dipstick [id number])     Status: Normal   Collection Time: 03/01/24  2:09 PM  Result Value Ref Range   Color, UA     Clarity, UA     Glucose, UA Negative Negative   Bilirubin, UA Negative    Ketones, UA Negative    Spec Grav, UA 1.015 1.010 - 1.025   Blood, UA Negative    pH, UA 6.0 5.0 - 8.0   Protein, UA  Negative Negative   Urobilinogen, UA 0.2 0.2 or 1.0 E.U./dL   Nitrite, UA Negative    Leukocytes, UA Negative Negative   Appearance     Odor        Assessment & Plan:  [name] was seen today for annual exam.  Essential hypertension  Mixed hyperlipidemia -     Rosuvastatin  Calcium ; Take 1 tablet (40 mg total) by mouth at bedtime.  Dispense: 90 tablet; Refill: 0  Moderate dementia with other behavioral disturbance, unspecified dementia type (HCC)  Type 2 diabetes mellitus without complication, without long-term current use of insulin  (HCC) -     Pioglitazone  HCl; Take 1 tablet (15 mg total) by mouth every morning.  Dispense: 90 tablet; Refill: 1  Stage 3b chronic kidney disease (HCC) -     POCT urinalysis dipstick  Benign neoplasm of skin of face -     Ambulatory referral to Dermatology  Depression, unspecified depression type  Needs flu shot -     Flu Vaccine Trivalent High Dose (Fluad)    Problem List Items Addressed This Visit       Cardiovascular and Mediastinum   Essential hypertension - Primary (Chronic)   Relevant Medications   rosuvastatin  (CRESTOR ) 40 MG tablet     Endocrine   DM2 (diabetes mellitus, type 2) (HCC) (Chronic)   Relevant Medications   rosuvastatin  (CRESTOR ) 40 MG tablet   pioglitazone  (ACTOS ) 15 MG tablet     Nervous and Auditory   Dementia (HCC)     Genitourinary   CKD (chronic kidney disease) stage 3, GFR 30-59 ml/min (HCC) (Chronic)   Relevant Orders   POCT Urinalysis Dipstick (81002) (Completed)     Other   Hyperlipidemia (Chronic)   Relevant Medications   rosuvastatin  (CRESTOR ) 40 MG tablet   Depression   Other Visit Diagnoses       Benign neoplasm of skin of face       Relevant Orders   Ambulatory referral to Dermatology     Needs flu shot       Relevant Orders   Flu Vaccine Trivalent High Dose (Fluad) (Completed)       Return in about 3 months (around 06/01/2024).   Total time spent: 30 minutes. This time includes  review of previous notes and results and patient face to face interaction during today'Telicia Hodgkiss visit.    Sherrill Cinderella Perry, MD  03/01/2024  This document may have been prepared by [employer] Voice Recognition software and as such may include unintentional dictation errors.

## 2024-03-02 NOTE — Progress Notes (Signed)
 Left vm to return call.

## 2024-03-02 NOTE — Progress Notes (Signed)
Pt informed

## 2024-03-19 ENCOUNTER — Other Ambulatory Visit: Payer: Self-pay | Admitting: Internal Medicine

## 2024-03-19 DIAGNOSIS — F28 Other psychotic disorder not due to a substance or known physiological condition: Secondary | ICD-10-CM

## 2024-03-23 NOTE — Progress Notes (Signed)
   03/23/2024  Patient ID: [name], female   DOB: 03-22-1941, 83 y.o.   MRN: [id number]  Pharmacy Quality Measure Review  This patient is appearing on a report for being at risk of failing the adherence measure for cholesterol (statin) medications this calendar year.   Medication: Rosuvastatin  Last fill date: 03/19/24 for 90 day supply  Insurance report was not up to date. No action needed at this time.   Jon VEAR Lindau, PharmD Clinical Pharmacist [phone]

## 2024-04-02 ENCOUNTER — Other Ambulatory Visit: Payer: Self-pay | Admitting: Internal Medicine

## 2024-04-02 DIAGNOSIS — G5603 Carpal tunnel syndrome, bilateral upper limbs: Secondary | ICD-10-CM

## 2024-04-02 DIAGNOSIS — F28 Other psychotic disorder not due to a substance or known physiological condition: Secondary | ICD-10-CM

## 2024-04-05 ENCOUNTER — Ambulatory Visit: Admitting: Internal Medicine

## 2024-04-05 ENCOUNTER — Encounter: Payer: Self-pay | Admitting: Internal Medicine

## 2024-04-05 VITALS — BP 124/73 | HR 66 | Temp 97.9°F | Ht 64.0 in | Wt 136.6 lb

## 2024-04-05 DIAGNOSIS — E1165 Type 2 diabetes mellitus with hyperglycemia: Secondary | ICD-10-CM | POA: Diagnosis not present

## 2024-04-05 DIAGNOSIS — R3589 Other polyuria: Secondary | ICD-10-CM | POA: Diagnosis not present

## 2024-04-05 DIAGNOSIS — Z0130 Encounter for examination of blood pressure without abnormal findings: Secondary | ICD-10-CM

## 2024-04-05 DIAGNOSIS — E119 Type 2 diabetes mellitus without complications: Secondary | ICD-10-CM

## 2024-04-05 DIAGNOSIS — F28 Other psychotic disorder not due to a substance or known physiological condition: Secondary | ICD-10-CM

## 2024-04-05 LAB — POCT URINALYSIS DIPSTICK
Bilirubin, UA: NEGATIVE
Blood, UA: NEGATIVE
Glucose, UA: NEGATIVE
Ketones, UA: NEGATIVE
Nitrite, UA: NEGATIVE
Protein, UA: POSITIVE — AB
Spec Grav, UA: 1.015
Urobilinogen, UA: 0.2 U/dL
pH, UA: 6

## 2024-04-05 LAB — POCT CBG (FASTING - GLUCOSE)-MANUAL ENTRY: Glucose Fasting, POC: 121 mg/dL — AB (ref 70–99)

## 2024-04-05 MED ORDER — ARIPIPRAZOLE 15 MG PO TABS: 7.5000 mg | ORAL_TABLET | Freq: Every day | ORAL | 1 refills | Status: DC

## 2024-04-05 NOTE — Progress Notes (Signed)
 "  Established Patient Office Visit  Subjective:  Patient ID: [name], female    DOB: 11/02/1940  Age: 83 y.o. MRN: [id number]  Chief Complaint  Patient presents with   Acute Visit    Poss UTI.     Acute visit for urinary incontinence and delirium yesterday.    No other concerns at this time.   Past Medical History:  Diagnosis Date   Alzheimer dementia (HCC)    unknown stage   Arthritis    hands, feet, knees, shoulders (05/05/2014)   Bipolar disorder (HCC)    Cataracts, bilateral    immature   Chronic kidney disease    dr says they aren't functioning like they should (05/05/2014)   Coronary artery disease    Depression    takes Zoloft  daily   History of bronchitis 43yrs ago   Hyperlipidemia    takes Atorvastatin  daily   Hypertension    takes Metoprolol ,Lisinopril ,and Apresoline  daily   Joint pain    Peripheral edema    takes Chlothalidone daily   Squamous cell cancer of skin of nose 2012   right   Squamous cell cancer of skin of nose    Type II diabetes mellitus (HCC)    takes Tradjenta  daily    Past Surgical History:  Procedure Laterality Date   COLONOSCOPY WITH PROPOFOL  N/A 02/03/2018   Procedure: COLONOSCOPY WITH PROPOFOL ;  Surgeon: Gaylyn Gladis PENNER, MD;  Location: [facility] ENDOSCOPY;  Service: Endoscopy;  Laterality: N/A;   CORONARY ARTERY BYPASS GRAFT  05/12/2007   CABG X1; at [facility]   coronary artery bypass with arterial grafts     DILATION AND CURETTAGE OF UTERUS  2005   FRACTURE SURGERY     I & D EXTREMITY Right 04/30/2014   Procedure: IRRIGATION AND DEBRIDEMENT EXTREMITY;  Surgeon: Norleen Armor, MD;  Location: [facility] OR;  Service: Orthopedics;  Laterality: Right;   INCISION AND DRAINAGE Right 08/11/2014   Procedure: REMOVAL OF DEEP IMPLANTS OF RIGHT TIBIAL FIBIAL INCISION AND DRAINAGE OF RIGHT ANKLE WOUND;  Surgeon: Norleen Armor, MD;  Location: [facility] OR;  Service: Orthopedics;  Laterality: Right;   ORIF ANKLE FRACTURE Right 04/30/2014   Procedure:  OPEN REDUCTION INTERNAL FIXATION (ORIF) ANKLE FRACTURE;  Surgeon: Norleen Armor, MD;  Location: [facility];  Service: Orthopedics;  Laterality: Right;   REMOVAL OF IMPLANT Right 08/11/2014   Procedure: REMOVAL OF DEEP IMPLANTS OF RIGHT TIBIAL FIBIAL INCISION AND DRAINAGE OF RIGHT ANKLE WOUND;  Surgeon: Norleen Armor, MD;  Location: [facility];  Service: Orthopedics;  Laterality: Right;   SQUAMOUS CELL CARCINOMA EXCISION Right 2012   nose   VAGINAL HYSTERECTOMY  2007    Social History   Socioeconomic History   Marital status: Widowed    Spouse name: Not on file   Number of children: Not on file   Years of education: Not on file   Highest education level: Not on file  Occupational History   Not on file  Tobacco Use   Smoking status: Never   Smokeless tobacco: Never  Vaping Use   Vaping status: Never Used  Substance and Sexual Activity   Alcohol use: No    Alcohol/week: 0.0 standard drinks of alcohol   Drug use: No   Sexual activity: Not on file  Other Topics Concern   Not on file  Social History Narrative   Not on file   Social Drivers of Health   Tobacco Use: Low Risk (04/05/2024)   Patient History    Smoking Tobacco  Use: Never    Smokeless Tobacco Use: Never    Passive Exposure: Not on file  Financial Resource Strain: Not on file  Food Insecurity: No Food Insecurity (11/30/2023)   Epic    Worried About Programme Researcher, Broadcasting/film/video in the Last Year: Never true    Ran Out of Food in the Last Year: Never true  Transportation Needs: No Transportation Needs (11/30/2023)   Epic    Lack of Transportation (Medical): No    Lack of Transportation (Non-Medical): No  Physical Activity: Not on file  Stress: Not on file  Social Connections: Socially Isolated (11/30/2023)   Social Connection and Isolation Panel    Frequency of Communication with Friends and Family: Three times a week    Frequency of Social Gatherings with Friends and Family: Three times a week    Attends Religious Services: Never     Active Member of Clubs or Organizations: No    Attends Banker Meetings: Never    Marital Status: Widowed  Intimate Partner Violence: Not At Risk (11/30/2023)   Epic    Fear of Current or Ex-Partner: No    Emotionally Abused: No    Physically Abused: No    Sexually Abused: No  Depression (PHQ2-9): Medium Risk (03/01/2024)   Depression (PHQ2-9)    PHQ-2 Score: 10  Alcohol Screen: Not on file  Housing: Low Risk (11/30/2023)   Epic    Unable to Pay for Housing in the Last Year: No    Number of Times Moved in the Last Year: 0    Homeless in the Last Year: No  Utilities: Not At Risk (11/30/2023)   Epic    Threatened with loss of utilities: No  Health Literacy: Not on file    Family History  Problem Relation Age of Onset   Heart disease Sister     Allergies[1]  Show/hide medication list[2]  Review of Systems  Constitutional:  Positive for weight loss (6 lbs).  HENT: Negative.    Eyes: Negative.   Respiratory: Negative.    Cardiovascular: Negative.   Gastrointestinal: Negative.   Genitourinary: Negative.   Skin: Negative.   Neurological: Negative.   Endo/Heme/Allergies: Negative.        Objective:   BP 124/73   Pulse 66   Temp 97.9 F (36.6 C)   Ht 5' 4 (1.626 m)   Wt 136 lb 9.6 oz (62 kg)   SpO2 99%   BMI 23.45 kg/m   Vitals:   04/05/24 1437  BP: 124/73  Pulse: 66  Temp: 97.9 F (36.6 C)  Height: 5' 4 (1.626 m)  Weight: 136 lb 9.6 oz (62 kg)  SpO2: 99%  BMI (Calculated): 23.44    Physical Exam Vitals reviewed.  Constitutional:      General: She is not in acute distress.    Appearance: She is obese.  HENT:     Head: Normocephalic.     Nose: Nose normal.     Mouth/Throat:     Mouth: Mucous membranes are moist.  Eyes:     Extraocular Movements: Extraocular movements intact.     Pupils: Pupils are equal, round, and reactive to light.  Cardiovascular:     Rate and Rhythm: Normal rate and regular rhythm.     Heart sounds: No  murmur heard. Pulmonary:     Effort: Pulmonary effort is normal.     Breath sounds: No rhonchi or rales.  Abdominal:     General: Abdomen is flat.  Palpations: There is no hepatomegaly, splenomegaly or mass.  Musculoskeletal:        General: Normal range of motion.     Cervical back: Normal range of motion. No tenderness.  Skin:    General: Skin is warm and dry.  Neurological:     General: No focal deficit present.     Mental Status: She is alert and oriented to person, place, and time.     Cranial Nerves: No cranial nerve deficit.     Motor: No weakness.  Psychiatric:        Mood and Affect: Mood normal.        Behavior: Behavior normal.      Results for orders placed or performed in visit on 04/05/24  POCT CBG (Fasting - Glucose)  Result Value Ref Range   Glucose Fasting, POC 121 (A) 70 - 99 mg/dL  POCT urinalysis dipstick  Result Value Ref Range   Color, UA yellow    Clarity, UA     Glucose, UA Negative Negative   Bilirubin, UA negative    Ketones, UA negative    Spec Grav, UA 1.015 1.010 - 1.025   Blood, UA negative    pH, UA 6.0 5.0 - 8.0   Protein, UA Positive (A) Negative   Urobilinogen, UA 0.2 0.2 or 1.0 E.U./dL   Nitrite, UA negative    Leukocytes, UA Small (1+) (A) Negative   Appearance     Odor      Recent Results (from the past 2160 hours)  POCT Urinalysis Dipstick (18997)     Status: Normal   Collection Time: 03/01/24  2:09 PM  Result Value Ref Range   Color, UA     Clarity, UA     Glucose, UA Negative Negative   Bilirubin, UA Negative    Ketones, UA Negative    Spec Grav, UA 1.015 1.010 - 1.025   Blood, UA Negative    pH, UA 6.0 5.0 - 8.0   Protein, UA Negative Negative   Urobilinogen, UA 0.2 0.2 or 1.0 E.U./dL   Nitrite, UA Negative    Leukocytes, UA Negative Negative   Appearance     Odor    POCT CBG (Fasting - Glucose)     Status: Abnormal   Collection Time: 04/05/24  2:47 PM  Result Value Ref Range   Glucose Fasting, POC 121 (A)  70 - 99 mg/dL  POCT urinalysis dipstick     Status: Abnormal   Collection Time: 04/05/24  3:07 PM  Result Value Ref Range   Color, UA yellow    Clarity, UA     Glucose, UA Negative Negative   Bilirubin, UA negative    Ketones, UA negative    Spec Grav, UA 1.015 1.010 - 1.025   Blood, UA negative    pH, UA 6.0 5.0 - 8.0   Protein, UA Positive (A) Negative   Urobilinogen, UA 0.2 0.2 or 1.0 E.U./dL   Nitrite, UA negative    Leukocytes, UA Small (1+) (A) Negative   Appearance     Odor        Assessment & Plan:  Leronda was seen today for acute visit.  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   Total time spent: 20 minutes. This time includes review of previous notes and results and patient face to face interaction during today'Erva Koke visit.    Sherrill Cinderella Perry, MD  04/05/2024   This document may have been prepared by [facility] Voice Recognition software and as such may include unintentional dictation errors.      [1]  Allergies Allergen Reactions   Codeine Nausea And Vomiting    Makes me deathly sick  [2]  Outpatient Medications Prior to Visit  Medication Sig    amLODipine  (NORVASC ) 10 MG tablet TAKE 1 TABLET(10 MG) BY MOUTH DAILY   Ascorbic Acid  (VITAMIN C  PO) Take 1 tablet by mouth daily.   aspirin  EC 81 MG tablet Take 81 mg by mouth daily. Swallow whole.   cholecalciferol  (VITAMIN D3) 25 MCG (1000 UNIT) tablet Take 1 tablet by mouth 2 (two) times daily.   citalopram  (CELEXA ) 20 MG tablet TAKE 1 TABLET(20 MG) BY MOUTH DAILY   feeding supplement (ENSURE ENLIVE / ENSURE PLUS) LIQD Take 237 mLs by mouth 2 (two) times daily between meals.   furosemide  (LASIX ) 20 MG tablet TAKE 1 TABLET(20 MG) BY MOUTH DAILY AS NEEDED   gabapentin  (NEURONTIN ) 300 MG capsule TAKE 1 CAPSULE(300 MG) BY MOUTH DAILY IN THE AFTERNOON   pioglitazone  (ACTOS ) 15 MG tablet Take 1 tablet (15 mg total) by mouth every morning.   rosuvastatin  (CRESTOR ) 40 MG tablet Take 1 tablet (40 mg total) by mouth at bedtime.   [DISCONTINUED] ARIPiprazole  (ABILIFY ) 15 MG tablet Take 0.5 tablets (7.5 mg total) by mouth daily.   No facility-administered medications prior to visit.   "

## 2024-04-07 ENCOUNTER — Other Ambulatory Visit: Payer: Self-pay

## 2024-04-07 MED ORDER — SERTRALINE HCL 50 MG PO TABS: 50.0000 mg | ORAL_TABLET | Freq: Every day | ORAL | 0 refills | Status: AC

## 2024-04-21 ENCOUNTER — Other Ambulatory Visit: Payer: Self-pay | Admitting: Internal Medicine

## 2024-04-21 DIAGNOSIS — F28 Other psychotic disorder not due to a substance or known physiological condition: Secondary | ICD-10-CM

## 2024-06-01 ENCOUNTER — Ambulatory Visit: Admitting: Internal Medicine
